# Patient Record
Sex: Female | Born: 1940 | Race: White | Hispanic: No | Marital: Single | Smoking: Never smoker
Health system: Southern US, Community
[De-identification: ages and names within clinical notes are randomized; demographics above are authoritative.]

## PROBLEM LIST (undated history)

## (undated) DIAGNOSIS — J45909 Unspecified asthma, uncomplicated: Secondary | ICD-10-CM

## (undated) DIAGNOSIS — B369 Superficial mycosis, unspecified: Secondary | ICD-10-CM

## (undated) DIAGNOSIS — I10 Essential (primary) hypertension: Secondary | ICD-10-CM

## (undated) DIAGNOSIS — K219 Gastro-esophageal reflux disease without esophagitis: Secondary | ICD-10-CM

## (undated) DIAGNOSIS — L02416 Cutaneous abscess of left lower limb: Secondary | ICD-10-CM

## (undated) DIAGNOSIS — R519 Headache, unspecified: Secondary | ICD-10-CM

## (undated) DIAGNOSIS — M1990 Unspecified osteoarthritis, unspecified site: Secondary | ICD-10-CM

## (undated) DIAGNOSIS — G4730 Sleep apnea, unspecified: Secondary | ICD-10-CM

## (undated) DIAGNOSIS — G500 Trigeminal neuralgia: Secondary | ICD-10-CM

## (undated) DIAGNOSIS — M797 Fibromyalgia: Secondary | ICD-10-CM

## (undated) DIAGNOSIS — R011 Cardiac murmur, unspecified: Secondary | ICD-10-CM

## (undated) DIAGNOSIS — I6521 Occlusion and stenosis of right carotid artery: Secondary | ICD-10-CM

## (undated) DIAGNOSIS — D649 Anemia, unspecified: Secondary | ICD-10-CM

## (undated) DIAGNOSIS — Z8489 Family history of other specified conditions: Secondary | ICD-10-CM

## (undated) DIAGNOSIS — L03116 Cellulitis of left lower limb: Secondary | ICD-10-CM

## (undated) DIAGNOSIS — M3390 Dermatopolymyositis, unspecified, organ involvement unspecified: Secondary | ICD-10-CM

## (undated) DIAGNOSIS — M6281 Muscle weakness (generalized): Secondary | ICD-10-CM

## (undated) DIAGNOSIS — R51 Headache: Secondary | ICD-10-CM

## (undated) DIAGNOSIS — M3313 Other dermatomyositis without myopathy: Secondary | ICD-10-CM

## (undated) DIAGNOSIS — Z9049 Acquired absence of other specified parts of digestive tract: Secondary | ICD-10-CM

## (undated) DIAGNOSIS — E039 Hypothyroidism, unspecified: Secondary | ICD-10-CM

## (undated) DIAGNOSIS — M5430 Sciatica, unspecified side: Secondary | ICD-10-CM

## (undated) DIAGNOSIS — Z973 Presence of spectacles and contact lenses: Secondary | ICD-10-CM

## (undated) DIAGNOSIS — M4800 Spinal stenosis, site unspecified: Secondary | ICD-10-CM

## (undated) HISTORY — PX: APPENDECTOMY: SHX54

## (undated) HISTORY — DX: Muscle weakness (generalized): M62.81

## (undated) HISTORY — DX: Acquired absence of other specified parts of digestive tract: Z90.49

## (undated) HISTORY — PX: TONSILLECTOMY: SUR1361

## (undated) HISTORY — PX: EYE SURGERY: SHX253

## (undated) HISTORY — DX: Spinal stenosis, site unspecified: M48.00

## (undated) HISTORY — DX: Occlusion and stenosis of right carotid artery: I65.21

## (undated) HISTORY — DX: Essential (primary) hypertension: I10

## (undated) HISTORY — DX: Trigeminal neuralgia: G50.0

## (undated) HISTORY — DX: Unspecified asthma, uncomplicated: J45.909

---
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
## 1998-07-29 HISTORY — PX: SHOULDER SURGERY: SHX246

## 1999-07-30 HISTORY — PX: TOTAL KNEE ARTHROPLASTY: SHX125

## 2002-07-29 HISTORY — PX: LUMBAR FUSION: SHX111

## 2003-07-30 HISTORY — PX: TOTAL HIP ARTHROPLASTY: SHX124

## 2003-12-08 ENCOUNTER — Encounter (HOSPITAL_COMMUNITY): Admission: RE | Admit: 2003-12-08 | Discharge: 2003-12-08 | Payer: Self-pay | Admitting: Family Medicine

## 2003-12-29 ENCOUNTER — Ambulatory Visit (HOSPITAL_COMMUNITY): Admission: RE | Admit: 2003-12-29 | Discharge: 2003-12-29 | Payer: Self-pay | Admitting: Family Medicine

## 2004-03-13 ENCOUNTER — Encounter: Admission: RE | Admit: 2004-03-13 | Discharge: 2004-06-11 | Payer: Self-pay | Admitting: Family Medicine

## 2004-04-19 ENCOUNTER — Ambulatory Visit (HOSPITAL_COMMUNITY): Admission: RE | Admit: 2004-04-19 | Discharge: 2004-04-19 | Payer: Self-pay | Admitting: Endocrinology

## 2004-05-08 ENCOUNTER — Encounter (INDEPENDENT_AMBULATORY_CARE_PROVIDER_SITE_OTHER): Payer: Self-pay | Admitting: *Deleted

## 2004-05-08 ENCOUNTER — Ambulatory Visit (HOSPITAL_COMMUNITY): Admission: RE | Admit: 2004-05-08 | Discharge: 2004-05-08 | Payer: Self-pay | Admitting: Endocrinology

## 2004-06-12 ENCOUNTER — Encounter: Admission: RE | Admit: 2004-06-12 | Discharge: 2004-07-28 | Payer: Self-pay | Admitting: Family Medicine

## 2004-07-29 ENCOUNTER — Encounter: Admission: RE | Admit: 2004-07-29 | Discharge: 2004-10-27 | Payer: Self-pay | Admitting: Family Medicine

## 2004-09-22 ENCOUNTER — Ambulatory Visit (HOSPITAL_COMMUNITY): Admission: RE | Admit: 2004-09-22 | Discharge: 2004-09-22 | Payer: Self-pay | Admitting: Family Medicine

## 2004-10-17 ENCOUNTER — Ambulatory Visit (HOSPITAL_COMMUNITY): Admission: RE | Admit: 2004-10-17 | Discharge: 2004-10-17 | Payer: Self-pay | Admitting: Endocrinology

## 2004-10-28 ENCOUNTER — Encounter: Admission: RE | Admit: 2004-10-28 | Discharge: 2004-12-26 | Payer: Self-pay | Admitting: Family Medicine

## 2005-02-04 ENCOUNTER — Ambulatory Visit (HOSPITAL_COMMUNITY): Admission: RE | Admit: 2005-02-04 | Discharge: 2005-02-04 | Payer: Self-pay | Admitting: Endocrinology

## 2005-05-01 ENCOUNTER — Ambulatory Visit (HOSPITAL_COMMUNITY): Admission: RE | Admit: 2005-05-01 | Discharge: 2005-05-01 | Payer: Self-pay | Admitting: Endocrinology

## 2005-06-06 ENCOUNTER — Encounter: Admission: RE | Admit: 2005-06-06 | Discharge: 2005-07-02 | Payer: Self-pay | Admitting: Family Medicine

## 2005-07-03 ENCOUNTER — Encounter: Admission: RE | Admit: 2005-07-03 | Discharge: 2005-08-04 | Payer: Self-pay | Admitting: Family Medicine

## 2005-08-05 ENCOUNTER — Encounter: Admission: RE | Admit: 2005-08-05 | Discharge: 2005-09-03 | Payer: Self-pay | Admitting: Family Medicine

## 2005-09-04 ENCOUNTER — Encounter: Admission: RE | Admit: 2005-09-04 | Discharge: 2005-10-01 | Payer: Self-pay | Admitting: Family Medicine

## 2005-10-02 ENCOUNTER — Encounter: Admission: RE | Admit: 2005-10-02 | Discharge: 2005-12-31 | Payer: Self-pay | Admitting: Family Medicine

## 2005-11-05 ENCOUNTER — Ambulatory Visit (HOSPITAL_COMMUNITY): Admission: RE | Admit: 2005-11-05 | Discharge: 2005-11-05 | Payer: Self-pay | Admitting: Endocrinology

## 2005-11-21 ENCOUNTER — Ambulatory Visit (HOSPITAL_BASED_OUTPATIENT_CLINIC_OR_DEPARTMENT_OTHER): Admission: RE | Admit: 2005-11-21 | Discharge: 2005-11-22 | Payer: Self-pay | Admitting: Orthopedic Surgery

## 2006-10-30 ENCOUNTER — Ambulatory Visit (HOSPITAL_COMMUNITY): Admission: RE | Admit: 2006-10-30 | Discharge: 2006-10-30 | Payer: Self-pay | Admitting: Endocrinology

## 2007-11-12 ENCOUNTER — Encounter: Admission: RE | Admit: 2007-11-12 | Discharge: 2007-11-12 | Payer: Self-pay | Admitting: Endocrinology

## 2008-01-12 ENCOUNTER — Ambulatory Visit (HOSPITAL_COMMUNITY): Admission: RE | Admit: 2008-01-12 | Discharge: 2008-01-12 | Payer: Self-pay | Admitting: Family Medicine

## 2008-01-12 ENCOUNTER — Encounter (INDEPENDENT_AMBULATORY_CARE_PROVIDER_SITE_OTHER): Payer: Self-pay | Admitting: Family Medicine

## 2008-01-12 ENCOUNTER — Ambulatory Visit: Payer: Self-pay | Admitting: Vascular Surgery

## 2008-01-12 ENCOUNTER — Ambulatory Visit: Admission: RE | Admit: 2008-01-12 | Discharge: 2008-01-12 | Payer: Self-pay | Admitting: Family Medicine

## 2009-03-13 ENCOUNTER — Encounter: Admission: RE | Admit: 2009-03-13 | Discharge: 2009-03-13 | Payer: Self-pay | Admitting: Endocrinology

## 2010-01-05 ENCOUNTER — Encounter: Admission: RE | Admit: 2010-01-05 | Discharge: 2010-01-05 | Payer: Self-pay | Admitting: Endocrinology

## 2010-01-24 ENCOUNTER — Other Ambulatory Visit: Admission: RE | Admit: 2010-01-24 | Discharge: 2010-01-24 | Payer: Self-pay | Admitting: Diagnostic Radiology

## 2010-01-24 ENCOUNTER — Encounter: Admission: RE | Admit: 2010-01-24 | Discharge: 2010-01-24 | Payer: Self-pay | Admitting: Endocrinology

## 2010-04-04 ENCOUNTER — Ambulatory Visit (HOSPITAL_COMMUNITY): Admission: RE | Admit: 2010-04-04 | Discharge: 2010-04-04 | Payer: Self-pay | Admitting: Surgery

## 2010-06-14 ENCOUNTER — Encounter: Admission: RE | Admit: 2010-06-14 | Discharge: 2010-06-14 | Payer: Self-pay | Admitting: Gastroenterology

## 2010-08-18 ENCOUNTER — Encounter: Payer: Self-pay | Admitting: Endocrinology

## 2010-08-19 ENCOUNTER — Encounter: Payer: Self-pay | Admitting: Family Medicine

## 2010-08-20 ENCOUNTER — Encounter: Payer: Self-pay | Admitting: Endocrinology

## 2010-11-26 ENCOUNTER — Ambulatory Visit: Payer: Medicare Other | Attending: Orthopedic Surgery | Admitting: Physical Therapy

## 2010-11-26 DIAGNOSIS — IMO0001 Reserved for inherently not codable concepts without codable children: Secondary | ICD-10-CM | POA: Insufficient documentation

## 2010-11-26 DIAGNOSIS — M6281 Muscle weakness (generalized): Secondary | ICD-10-CM | POA: Insufficient documentation

## 2010-11-26 DIAGNOSIS — R262 Difficulty in walking, not elsewhere classified: Secondary | ICD-10-CM | POA: Insufficient documentation

## 2010-12-03 ENCOUNTER — Ambulatory Visit: Payer: Medicare Other | Attending: Orthopedic Surgery | Admitting: *Deleted

## 2010-12-03 ENCOUNTER — Encounter: Payer: Self-pay | Admitting: Speech Pathology

## 2010-12-03 DIAGNOSIS — IMO0001 Reserved for inherently not codable concepts without codable children: Secondary | ICD-10-CM | POA: Insufficient documentation

## 2010-12-03 DIAGNOSIS — M6281 Muscle weakness (generalized): Secondary | ICD-10-CM | POA: Insufficient documentation

## 2010-12-03 DIAGNOSIS — R262 Difficulty in walking, not elsewhere classified: Secondary | ICD-10-CM | POA: Insufficient documentation

## 2010-12-05 ENCOUNTER — Ambulatory Visit: Payer: Medicare Other | Admitting: *Deleted

## 2010-12-05 ENCOUNTER — Encounter: Payer: Self-pay | Admitting: Speech Pathology

## 2010-12-10 ENCOUNTER — Ambulatory Visit: Payer: Medicare Other | Admitting: Physical Therapy

## 2010-12-12 ENCOUNTER — Ambulatory Visit: Payer: Medicare Other | Admitting: *Deleted

## 2010-12-14 NOTE — Op Note (Signed)
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   PROCEDURE:  1.  Manipulation under anesthesia.  2.  Debridement rotator cuff tear and biceps tear.  3.  Subacromial decompression.   SURGEON:  Nadara Mustard, MD   ANESTHESIA:  General.   ESTIMATED BLOOD LOSS:  Minimal.   ANTIBIOTICS:  Clindamycin 600 mg IV.   DRAINS:  None.   COMPLICATIONS:  None.   Injection of 20 mL of half percent Marcaine plain and 4 mg of morphine.   DISPOSITION:  To PACU in stable condition.  Plan for 20 for observation.   INDICATIONS FOR PROCEDURE:  The patient is a 70 year old woman with a  symptomatic right shoulder.  The patient has failed conservative care.  She  has undergone subacromial injections with temporary relief.  The patient  complains of pain with activities of daily living, difficulty sleeping at  night due to pain and presents at this time for arthroscopic intervention.  Risks and benefits were discussed including infection, neurovascular injury,  persistent pain, need for additional surgery.  The patient states she  understands and wished proceed at this time.   DESCRIPTION OF PROCEDURE:  The patient was brought to OR room 5 underwent a  general anesthetic.  After adequate level of anesthesia obtained, the  patient's right upper extremity was prepped using DuraPrep and draped in a  sterile field with the patient in the beach-chair position.  The scope was  inserted into the  posterior portal into the shoulder and the anterior portal  was established from an outside-in technique with a 18 gauge spinal.  Visualization showed almost completely eburnated bone on the glenoid and  humeral head.  There were a few areas of residual cartilage and this was  loose and was debrided.  The patient had a complete rupture of the biceps  and this was debrided.  There was partial tearing of rotator cuff which was  also debrided.  There was also a SLAP lesion which was also debrided.  After  debridement, the vapor Mitek was used for hemostasis and the instruments  removed.  The scope was then inserted from the posterior portal in the  subacromial space and a new lateral portal was established.  The vapor Mitek  wand was inserted.  A bursectomy was performed with the vapor wand.  The  patient had very hooked type 3 acromion and the acromionizer was used for  subacromial decompression.  The vapor Mitek was then used for hemostasis.  The instruments were removed.  The portals  were injected with total 20 mL of  half percent Marcaine plain and 4 mg of morphine.  The wounds were closed  using 4-0 nylon and the wounds were covered with Adaptic orthopedic sponges,  ABD dressing and Hypafix tape.  Patient was extubated, taken to PACU in  stable condition.  Plan for 23 observation.  Plan to follow-up in office in  2 weeks.      Nadara Mustard, MD  Electronically Signed     MVD/MEDQ  D:  11/21/2005  T:  11/22/2005  Job:  [id number]

## 2010-12-17 ENCOUNTER — Ambulatory Visit: Payer: Medicare Other | Admitting: *Deleted

## 2010-12-19 ENCOUNTER — Ambulatory Visit: Payer: Medicare Other | Admitting: *Deleted

## 2010-12-31 ENCOUNTER — Ambulatory Visit: Payer: PRIVATE HEALTH INSURANCE | Admitting: *Deleted

## 2011-01-03 ENCOUNTER — Ambulatory Visit: Payer: Medicare Other | Attending: Orthopedic Surgery | Admitting: *Deleted

## 2011-01-03 DIAGNOSIS — IMO0001 Reserved for inherently not codable concepts without codable children: Secondary | ICD-10-CM | POA: Insufficient documentation

## 2011-01-03 DIAGNOSIS — R262 Difficulty in walking, not elsewhere classified: Secondary | ICD-10-CM | POA: Insufficient documentation

## 2011-01-03 DIAGNOSIS — M6281 Muscle weakness (generalized): Secondary | ICD-10-CM | POA: Insufficient documentation

## 2011-01-28 ENCOUNTER — Ambulatory Visit: Payer: PRIVATE HEALTH INSURANCE | Admitting: *Deleted

## 2011-08-05 DIAGNOSIS — M1710 Unilateral primary osteoarthritis, unspecified knee: Secondary | ICD-10-CM | POA: Diagnosis not present

## 2011-08-05 DIAGNOSIS — M19079 Primary osteoarthritis, unspecified ankle and foot: Secondary | ICD-10-CM | POA: Diagnosis not present

## 2011-08-14 DIAGNOSIS — M1710 Unilateral primary osteoarthritis, unspecified knee: Secondary | ICD-10-CM | POA: Diagnosis not present

## 2011-08-26 DIAGNOSIS — M1710 Unilateral primary osteoarthritis, unspecified knee: Secondary | ICD-10-CM | POA: Diagnosis not present

## 2011-10-16 DIAGNOSIS — S139XXA Sprain of joints and ligaments of unspecified parts of neck, initial encounter: Secondary | ICD-10-CM | POA: Diagnosis not present

## 2011-10-16 DIAGNOSIS — I10 Essential (primary) hypertension: Secondary | ICD-10-CM | POA: Diagnosis not present

## 2011-10-24 ENCOUNTER — Other Ambulatory Visit: Payer: Self-pay | Admitting: Endocrinology

## 2011-10-24 DIAGNOSIS — E041 Nontoxic single thyroid nodule: Secondary | ICD-10-CM

## 2011-10-25 ENCOUNTER — Ambulatory Visit
Admission: RE | Admit: 2011-10-25 | Discharge: 2011-10-25 | Disposition: A | Discharge status: Home / Self Care | Payer: PRIVATE HEALTH INSURANCE | Source: Ambulatory Visit | Attending: Endocrinology | Admitting: Endocrinology

## 2011-10-25 DIAGNOSIS — E041 Nontoxic single thyroid nodule: Secondary | ICD-10-CM

## 2011-10-25 DIAGNOSIS — E042 Nontoxic multinodular goiter: Secondary | ICD-10-CM | POA: Diagnosis not present

## 2011-10-28 DIAGNOSIS — E039 Hypothyroidism, unspecified: Secondary | ICD-10-CM | POA: Diagnosis not present

## 2011-10-28 DIAGNOSIS — I10 Essential (primary) hypertension: Secondary | ICD-10-CM | POA: Diagnosis not present

## 2011-10-28 DIAGNOSIS — E785 Hyperlipidemia, unspecified: Secondary | ICD-10-CM | POA: Diagnosis not present

## 2011-10-28 DIAGNOSIS — E559 Vitamin D deficiency, unspecified: Secondary | ICD-10-CM | POA: Diagnosis not present

## 2011-11-04 DIAGNOSIS — E559 Vitamin D deficiency, unspecified: Secondary | ICD-10-CM | POA: Diagnosis not present

## 2011-11-04 DIAGNOSIS — E049 Nontoxic goiter, unspecified: Secondary | ICD-10-CM | POA: Diagnosis not present

## 2011-11-04 DIAGNOSIS — I10 Essential (primary) hypertension: Secondary | ICD-10-CM | POA: Diagnosis not present

## 2011-11-04 DIAGNOSIS — N318 Other neuromuscular dysfunction of bladder: Secondary | ICD-10-CM | POA: Diagnosis not present

## 2011-11-04 DIAGNOSIS — E039 Hypothyroidism, unspecified: Secondary | ICD-10-CM | POA: Diagnosis not present

## 2011-11-04 DIAGNOSIS — E7800 Pure hypercholesterolemia, unspecified: Secondary | ICD-10-CM | POA: Diagnosis not present

## 2011-11-04 DIAGNOSIS — J45909 Unspecified asthma, uncomplicated: Secondary | ICD-10-CM | POA: Diagnosis not present

## 2011-11-05 ENCOUNTER — Other Ambulatory Visit: Payer: Self-pay | Admitting: Endocrinology

## 2011-11-05 DIAGNOSIS — E049 Nontoxic goiter, unspecified: Secondary | ICD-10-CM

## 2011-12-17 ENCOUNTER — Ambulatory Visit: Payer: Medicare Other | Attending: Family Medicine | Admitting: Rehabilitative and Restorative Service Providers"

## 2011-12-17 DIAGNOSIS — IMO0001 Reserved for inherently not codable concepts without codable children: Secondary | ICD-10-CM | POA: Diagnosis not present

## 2011-12-17 DIAGNOSIS — R293 Abnormal posture: Secondary | ICD-10-CM | POA: Insufficient documentation

## 2011-12-17 DIAGNOSIS — M25519 Pain in unspecified shoulder: Secondary | ICD-10-CM | POA: Insufficient documentation

## 2011-12-25 ENCOUNTER — Ambulatory Visit: Payer: Medicare Other | Admitting: Physical Therapy

## 2011-12-31 ENCOUNTER — Ambulatory Visit: Payer: Medicare Other | Attending: Family Medicine | Admitting: Rehabilitative and Restorative Service Providers"

## 2011-12-31 DIAGNOSIS — M25519 Pain in unspecified shoulder: Secondary | ICD-10-CM | POA: Diagnosis not present

## 2011-12-31 DIAGNOSIS — IMO0001 Reserved for inherently not codable concepts without codable children: Secondary | ICD-10-CM | POA: Diagnosis not present

## 2011-12-31 DIAGNOSIS — R293 Abnormal posture: Secondary | ICD-10-CM | POA: Insufficient documentation

## 2012-01-02 ENCOUNTER — Ambulatory Visit: Payer: Medicare Other | Admitting: Rehabilitative and Restorative Service Providers"

## 2012-01-02 DIAGNOSIS — I10 Essential (primary) hypertension: Secondary | ICD-10-CM | POA: Diagnosis not present

## 2012-01-02 DIAGNOSIS — Z23 Encounter for immunization: Secondary | ICD-10-CM | POA: Diagnosis not present

## 2012-01-05 DIAGNOSIS — Z23 Encounter for immunization: Secondary | ICD-10-CM | POA: Diagnosis not present

## 2012-01-05 DIAGNOSIS — Z203 Contact with and (suspected) exposure to rabies: Secondary | ICD-10-CM | POA: Diagnosis not present

## 2012-01-07 ENCOUNTER — Ambulatory Visit: Payer: Medicare Other | Admitting: Rehabilitative and Restorative Service Providers"

## 2012-01-09 ENCOUNTER — Ambulatory Visit: Payer: Medicare Other | Admitting: Rehabilitative and Restorative Service Providers"

## 2012-01-09 DIAGNOSIS — Z23 Encounter for immunization: Secondary | ICD-10-CM | POA: Diagnosis not present

## 2012-01-09 DIAGNOSIS — Z203 Contact with and (suspected) exposure to rabies: Secondary | ICD-10-CM | POA: Diagnosis not present

## 2012-01-16 DIAGNOSIS — Z23 Encounter for immunization: Secondary | ICD-10-CM | POA: Diagnosis not present

## 2012-01-16 DIAGNOSIS — Z203 Contact with and (suspected) exposure to rabies: Secondary | ICD-10-CM | POA: Diagnosis not present

## 2012-01-21 ENCOUNTER — Ambulatory Visit: Payer: Medicare Other | Admitting: Physical Therapy

## 2012-01-23 ENCOUNTER — Ambulatory Visit: Payer: Medicare Other | Admitting: Physical Therapy

## 2012-01-23 DIAGNOSIS — E7800 Pure hypercholesterolemia, unspecified: Secondary | ICD-10-CM | POA: Diagnosis not present

## 2012-01-23 DIAGNOSIS — L0291 Cutaneous abscess, unspecified: Secondary | ICD-10-CM | POA: Diagnosis not present

## 2012-01-23 DIAGNOSIS — R609 Edema, unspecified: Secondary | ICD-10-CM | POA: Diagnosis not present

## 2012-01-23 DIAGNOSIS — Z23 Encounter for immunization: Secondary | ICD-10-CM | POA: Diagnosis not present

## 2012-01-23 DIAGNOSIS — M7989 Other specified soft tissue disorders: Secondary | ICD-10-CM | POA: Diagnosis not present

## 2012-01-27 ENCOUNTER — Encounter: Payer: Medicare Other | Admitting: Physical Therapy

## 2012-01-28 ENCOUNTER — Encounter: Payer: Medicare Other | Admitting: Physical Therapy

## 2012-02-03 ENCOUNTER — Ambulatory Visit: Payer: Medicare Other | Attending: Family Medicine | Admitting: Rehabilitative and Restorative Service Providers"

## 2012-02-03 DIAGNOSIS — R293 Abnormal posture: Secondary | ICD-10-CM | POA: Insufficient documentation

## 2012-02-03 DIAGNOSIS — IMO0001 Reserved for inherently not codable concepts without codable children: Secondary | ICD-10-CM | POA: Diagnosis not present

## 2012-02-03 DIAGNOSIS — M25519 Pain in unspecified shoulder: Secondary | ICD-10-CM | POA: Diagnosis not present

## 2012-02-04 DIAGNOSIS — S92309A Fracture of unspecified metatarsal bone(s), unspecified foot, initial encounter for closed fracture: Secondary | ICD-10-CM | POA: Diagnosis not present

## 2012-02-04 DIAGNOSIS — M25579 Pain in unspecified ankle and joints of unspecified foot: Secondary | ICD-10-CM | POA: Diagnosis not present

## 2012-02-06 ENCOUNTER — Encounter: Payer: Medicare Other | Admitting: Rehabilitative and Restorative Service Providers"

## 2012-02-27 DIAGNOSIS — E8941 Symptomatic postprocedural ovarian failure: Secondary | ICD-10-CM | POA: Diagnosis not present

## 2012-03-09 DIAGNOSIS — L03119 Cellulitis of unspecified part of limb: Secondary | ICD-10-CM | POA: Diagnosis not present

## 2012-03-09 DIAGNOSIS — L02419 Cutaneous abscess of limb, unspecified: Secondary | ICD-10-CM | POA: Diagnosis not present

## 2012-06-19 DIAGNOSIS — B029 Zoster without complications: Secondary | ICD-10-CM | POA: Diagnosis not present

## 2012-06-19 DIAGNOSIS — L259 Unspecified contact dermatitis, unspecified cause: Secondary | ICD-10-CM | POA: Diagnosis not present

## 2012-06-19 DIAGNOSIS — R609 Edema, unspecified: Secondary | ICD-10-CM | POA: Diagnosis not present

## 2012-06-19 DIAGNOSIS — Z23 Encounter for immunization: Secondary | ICD-10-CM | POA: Diagnosis not present

## 2012-07-23 DIAGNOSIS — M1710 Unilateral primary osteoarthritis, unspecified knee: Secondary | ICD-10-CM | POA: Diagnosis not present

## 2012-08-07 DIAGNOSIS — M1710 Unilateral primary osteoarthritis, unspecified knee: Secondary | ICD-10-CM | POA: Diagnosis not present

## 2012-08-13 DIAGNOSIS — M1710 Unilateral primary osteoarthritis, unspecified knee: Secondary | ICD-10-CM | POA: Diagnosis not present

## 2012-08-21 DIAGNOSIS — E876 Hypokalemia: Secondary | ICD-10-CM | POA: Diagnosis not present

## 2012-08-25 ENCOUNTER — Other Ambulatory Visit: Payer: Self-pay | Admitting: Endocrinology

## 2012-08-25 DIAGNOSIS — E049 Nontoxic goiter, unspecified: Secondary | ICD-10-CM

## 2012-08-25 DIAGNOSIS — E559 Vitamin D deficiency, unspecified: Secondary | ICD-10-CM | POA: Diagnosis not present

## 2012-08-25 DIAGNOSIS — I10 Essential (primary) hypertension: Secondary | ICD-10-CM | POA: Diagnosis not present

## 2012-08-25 DIAGNOSIS — E039 Hypothyroidism, unspecified: Secondary | ICD-10-CM | POA: Diagnosis not present

## 2012-08-31 ENCOUNTER — Ambulatory Visit
Admission: RE | Admit: 2012-08-31 | Discharge: 2012-08-31 | Disposition: A | Discharge status: Home / Self Care | Payer: Medicare Other | Source: Ambulatory Visit | Attending: Endocrinology | Admitting: Endocrinology

## 2012-08-31 DIAGNOSIS — E042 Nontoxic multinodular goiter: Secondary | ICD-10-CM | POA: Diagnosis not present

## 2012-08-31 DIAGNOSIS — E049 Nontoxic goiter, unspecified: Secondary | ICD-10-CM

## 2012-10-21 DIAGNOSIS — R5381 Other malaise: Secondary | ICD-10-CM | POA: Diagnosis not present

## 2012-10-21 DIAGNOSIS — R5383 Other fatigue: Secondary | ICD-10-CM | POA: Diagnosis not present

## 2012-10-21 DIAGNOSIS — M6281 Muscle weakness (generalized): Secondary | ICD-10-CM | POA: Diagnosis not present

## 2012-10-21 DIAGNOSIS — R609 Edema, unspecified: Secondary | ICD-10-CM | POA: Diagnosis not present

## 2012-10-21 DIAGNOSIS — IMO0001 Reserved for inherently not codable concepts without codable children: Secondary | ICD-10-CM | POA: Diagnosis not present

## 2012-10-21 DIAGNOSIS — M62838 Other muscle spasm: Secondary | ICD-10-CM | POA: Diagnosis not present

## 2012-11-26 ENCOUNTER — Ambulatory Visit (INDEPENDENT_AMBULATORY_CARE_PROVIDER_SITE_OTHER): Payer: Medicare Other | Admitting: Diagnostic Neuroimaging

## 2012-11-26 ENCOUNTER — Encounter: Payer: Self-pay | Admitting: Diagnostic Neuroimaging

## 2012-11-26 ENCOUNTER — Other Ambulatory Visit: Payer: Self-pay | Admitting: *Deleted

## 2012-11-26 VITALS — BP 150/85 | HR 81 | Temp 98.9°F | Ht 61.0 in

## 2012-11-26 DIAGNOSIS — M62838 Other muscle spasm: Secondary | ICD-10-CM | POA: Diagnosis not present

## 2012-11-26 DIAGNOSIS — R5381 Other malaise: Secondary | ICD-10-CM

## 2012-11-26 DIAGNOSIS — R531 Weakness: Secondary | ICD-10-CM

## 2012-11-26 DIAGNOSIS — R269 Unspecified abnormalities of gait and mobility: Secondary | ICD-10-CM | POA: Diagnosis not present

## 2012-11-26 DIAGNOSIS — R5383 Other fatigue: Secondary | ICD-10-CM | POA: Diagnosis not present

## 2012-11-26 NOTE — Patient Instructions (Signed)
I will check MRI cervical and lumbar spine. I will check blood work. If this is unremarkable we may consider electrical nerve test (EMG) next.
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
## 2012-12-08 ENCOUNTER — Ambulatory Visit
Admission: RE | Admit: 2012-12-08 | Discharge: 2012-12-08 | Disposition: A | Discharge status: Home / Self Care | Payer: Medicare Other | Source: Ambulatory Visit | Attending: Diagnostic Neuroimaging | Admitting: Diagnostic Neuroimaging

## 2012-12-08 DIAGNOSIS — R5381 Other malaise: Secondary | ICD-10-CM | POA: Diagnosis not present

## 2012-12-08 DIAGNOSIS — M62838 Other muscle spasm: Secondary | ICD-10-CM

## 2012-12-08 DIAGNOSIS — R269 Unspecified abnormalities of gait and mobility: Secondary | ICD-10-CM

## 2012-12-08 DIAGNOSIS — R531 Weakness: Secondary | ICD-10-CM

## 2012-12-16 ENCOUNTER — Telehealth: Payer: Self-pay | Admitting: Diagnostic Neuroimaging

## 2012-12-17 ENCOUNTER — Telehealth: Payer: Self-pay | Admitting: Diagnostic Neuroimaging

## 2012-12-17 DIAGNOSIS — R531 Weakness: Secondary | ICD-10-CM

## 2012-12-17 NOTE — Telephone Encounter (Signed)
I called and spoke to pt and and then deferred to Dr. Marjory Lies.

## 2012-12-17 NOTE — Telephone Encounter (Signed)
Spoke with patient about MRI cervical lumbar spine results. She has multiple levels of mild spinal stenosis at multilevel foraminal stenosis. May contribute to weakness and gait, but not definite. She also has other factors to contribute to her gait (hip replacement, knee replacement, obesity).  Discussed option of surgical evaluation, and patient wants me to send the results to her previous neurosurgeon Dr. Stasia Cavalier.   Also reports that her well water was tested for arsenic and was within normal limits, but low levels were detected.  Also I will check labs and EMG.   Suanne Marker, MD 12/17/2012, 10:11 AM Certified in Neurology, Neurophysiology and Neuroimaging  [facility] [address], [address] [place], [place] [address] [phone]

## 2012-12-22 ENCOUNTER — Other Ambulatory Visit: Payer: Self-pay | Admitting: Diagnostic Neuroimaging

## 2012-12-22 DIAGNOSIS — R5383 Other fatigue: Secondary | ICD-10-CM | POA: Diagnosis not present

## 2012-12-22 DIAGNOSIS — R5381 Other malaise: Secondary | ICD-10-CM | POA: Diagnosis not present

## 2012-12-22 DIAGNOSIS — M62838 Other muscle spasm: Secondary | ICD-10-CM | POA: Diagnosis not present

## 2012-12-22 DIAGNOSIS — R269 Unspecified abnormalities of gait and mobility: Secondary | ICD-10-CM | POA: Diagnosis not present

## 2012-12-23 ENCOUNTER — Encounter: Payer: Self-pay | Admitting: Diagnostic Neuroimaging

## 2012-12-24 LAB — CK: Total CK: 18 U/L — ABNORMAL LOW (ref 24–173)

## 2012-12-24 LAB — HEAVY METALS SCREEN, URINE
Arsenic Ur: NOT DETECTED ug/L (ref 0–50)
Arsenic(Inorganic),U: NOT DETECTED ug/L (ref 0–19)
Creatinine(Crt),U: 0.49 g/L (ref 0.30–3.00)

## 2012-12-24 LAB — ARSENIC, BLOOD: Arsenic: 6 ug/L (ref 2–23)

## 2012-12-24 LAB — TSH: TSH: 1.18 u[IU]/mL (ref 0.450–4.500)

## 2012-12-24 LAB — ACETYLCHOLINE RECEPTOR, BINDING: AChR Binding Ab, Serum: 0.05 nmol/L (ref 0.00–0.24)

## 2012-12-31 ENCOUNTER — Telehealth: Payer: Self-pay | Admitting: Diagnostic Neuroimaging

## 2012-12-31 NOTE — Telephone Encounter (Signed)
I called and spoke with the patient to inform her that her lab are in but Dr. Marjory Lies need to review them and once reviewed someone will call her with results.

## 2013-01-05 ENCOUNTER — Telehealth: Payer: Self-pay | Admitting: Diagnostic Neuroimaging

## 2013-01-05 NOTE — Telephone Encounter (Signed)
Patient is calling back to tell us she hasn't heard back from anyone regarding her most recent lab tests.  She can be reached at [phone]

## 2013-01-06 DIAGNOSIS — G4733 Obstructive sleep apnea (adult) (pediatric): Secondary | ICD-10-CM | POA: Diagnosis not present

## 2013-01-06 NOTE — Telephone Encounter (Signed)
Spoke to patient. She was at a doctor's office appt. Requesting lab results. Will call back after appointment.

## 2013-01-15 ENCOUNTER — Encounter: Payer: Self-pay | Admitting: *Deleted

## 2013-01-15 DIAGNOSIS — M47812 Spondylosis without myelopathy or radiculopathy, cervical region: Secondary | ICD-10-CM | POA: Diagnosis not present

## 2013-01-15 DIAGNOSIS — M4802 Spinal stenosis, cervical region: Secondary | ICD-10-CM | POA: Diagnosis not present

## 2013-01-15 DIAGNOSIS — M47817 Spondylosis without myelopathy or radiculopathy, lumbosacral region: Secondary | ICD-10-CM | POA: Diagnosis not present

## 2013-01-15 DIAGNOSIS — M6281 Muscle weakness (generalized): Secondary | ICD-10-CM | POA: Diagnosis not present

## 2013-01-15 NOTE — Telephone Encounter (Signed)
Message copied by Monico Blitz on Fri Jan 15, 2013  4:26 PM ------      Message from: [facility], [place]      Created: Fri Jan 15, 2013  2:23 PM      Contact: pt       Pt calling and wants results of lab work that she had several weeks ago.  She needs results of an anti body test that was done.  She can be reached @ [phone].  Please call ASAP ------

## 2013-01-15 NOTE — Telephone Encounter (Signed)
This encounter was created in error - please disregard.

## 2013-02-17 DIAGNOSIS — M48061 Spinal stenosis, lumbar region without neurogenic claudication: Secondary | ICD-10-CM | POA: Diagnosis not present

## 2013-03-09 DIAGNOSIS — M545 Low back pain, unspecified: Secondary | ICD-10-CM | POA: Diagnosis not present

## 2013-03-09 DIAGNOSIS — M6281 Muscle weakness (generalized): Secondary | ICD-10-CM | POA: Diagnosis not present

## 2013-03-09 DIAGNOSIS — M48061 Spinal stenosis, lumbar region without neurogenic claudication: Secondary | ICD-10-CM | POA: Diagnosis not present

## 2013-03-09 DIAGNOSIS — R252 Cramp and spasm: Secondary | ICD-10-CM | POA: Diagnosis not present

## 2013-03-11 DIAGNOSIS — G4733 Obstructive sleep apnea (adult) (pediatric): Secondary | ICD-10-CM | POA: Diagnosis not present

## 2013-03-24 DIAGNOSIS — M48061 Spinal stenosis, lumbar region without neurogenic claudication: Secondary | ICD-10-CM | POA: Diagnosis not present

## 2013-03-24 DIAGNOSIS — R252 Cramp and spasm: Secondary | ICD-10-CM | POA: Diagnosis not present

## 2013-03-24 DIAGNOSIS — M6281 Muscle weakness (generalized): Secondary | ICD-10-CM | POA: Diagnosis not present

## 2013-03-24 DIAGNOSIS — M216X9 Other acquired deformities of unspecified foot: Secondary | ICD-10-CM | POA: Diagnosis not present

## 2013-04-14 DIAGNOSIS — M6281 Muscle weakness (generalized): Secondary | ICD-10-CM | POA: Diagnosis not present

## 2013-04-14 DIAGNOSIS — E669 Obesity, unspecified: Secondary | ICD-10-CM | POA: Diagnosis not present

## 2013-04-20 DIAGNOSIS — M6281 Muscle weakness (generalized): Secondary | ICD-10-CM | POA: Diagnosis not present

## 2013-04-22 DIAGNOSIS — J45909 Unspecified asthma, uncomplicated: Secondary | ICD-10-CM | POA: Diagnosis not present

## 2013-04-22 DIAGNOSIS — I10 Essential (primary) hypertension: Secondary | ICD-10-CM | POA: Diagnosis not present

## 2013-04-22 DIAGNOSIS — K219 Gastro-esophageal reflux disease without esophagitis: Secondary | ICD-10-CM | POA: Diagnosis not present

## 2013-04-22 DIAGNOSIS — M6281 Muscle weakness (generalized): Secondary | ICD-10-CM | POA: Diagnosis not present

## 2013-04-22 DIAGNOSIS — Z79899 Other long term (current) drug therapy: Secondary | ICD-10-CM | POA: Diagnosis not present

## 2013-04-22 DIAGNOSIS — G4733 Obstructive sleep apnea (adult) (pediatric): Secondary | ICD-10-CM | POA: Diagnosis not present

## 2013-04-22 DIAGNOSIS — R0602 Shortness of breath: Secondary | ICD-10-CM | POA: Diagnosis not present

## 2013-04-23 DIAGNOSIS — M6281 Muscle weakness (generalized): Secondary | ICD-10-CM | POA: Diagnosis not present

## 2013-05-03 DIAGNOSIS — Z79899 Other long term (current) drug therapy: Secondary | ICD-10-CM | POA: Diagnosis not present

## 2013-05-03 DIAGNOSIS — J45909 Unspecified asthma, uncomplicated: Secondary | ICD-10-CM | POA: Diagnosis not present

## 2013-05-03 DIAGNOSIS — R0602 Shortness of breath: Secondary | ICD-10-CM | POA: Diagnosis not present

## 2013-05-03 DIAGNOSIS — K219 Gastro-esophageal reflux disease without esophagitis: Secondary | ICD-10-CM | POA: Diagnosis not present

## 2013-05-03 DIAGNOSIS — G4733 Obstructive sleep apnea (adult) (pediatric): Secondary | ICD-10-CM | POA: Diagnosis not present

## 2013-05-03 DIAGNOSIS — M6281 Muscle weakness (generalized): Secondary | ICD-10-CM | POA: Diagnosis not present

## 2013-05-03 DIAGNOSIS — I10 Essential (primary) hypertension: Secondary | ICD-10-CM | POA: Diagnosis not present

## 2013-05-14 DIAGNOSIS — M6281 Muscle weakness (generalized): Secondary | ICD-10-CM | POA: Diagnosis not present

## 2013-05-19 DIAGNOSIS — M6281 Muscle weakness (generalized): Secondary | ICD-10-CM | POA: Diagnosis not present

## 2013-05-24 DIAGNOSIS — M6281 Muscle weakness (generalized): Secondary | ICD-10-CM | POA: Diagnosis not present

## 2013-05-24 DIAGNOSIS — R252 Cramp and spasm: Secondary | ICD-10-CM | POA: Diagnosis not present

## 2013-05-24 DIAGNOSIS — IMO0001 Reserved for inherently not codable concepts without codable children: Secondary | ICD-10-CM | POA: Diagnosis not present

## 2013-05-26 DIAGNOSIS — M6281 Muscle weakness (generalized): Secondary | ICD-10-CM | POA: Diagnosis not present

## 2013-05-27 DIAGNOSIS — M6281 Muscle weakness (generalized): Secondary | ICD-10-CM | POA: Diagnosis not present

## 2013-05-28 DIAGNOSIS — Z1331 Encounter for screening for depression: Secondary | ICD-10-CM | POA: Diagnosis not present

## 2013-05-28 DIAGNOSIS — E785 Hyperlipidemia, unspecified: Secondary | ICD-10-CM | POA: Diagnosis not present

## 2013-05-28 DIAGNOSIS — N318 Other neuromuscular dysfunction of bladder: Secondary | ICD-10-CM | POA: Diagnosis not present

## 2013-05-28 DIAGNOSIS — I10 Essential (primary) hypertension: Secondary | ICD-10-CM | POA: Diagnosis not present

## 2013-05-28 DIAGNOSIS — M6281 Muscle weakness (generalized): Secondary | ICD-10-CM | POA: Diagnosis not present

## 2013-05-28 DIAGNOSIS — J45909 Unspecified asthma, uncomplicated: Secondary | ICD-10-CM | POA: Diagnosis not present

## 2013-05-28 DIAGNOSIS — M5430 Sciatica, unspecified side: Secondary | ICD-10-CM | POA: Diagnosis not present

## 2013-05-28 DIAGNOSIS — Z23 Encounter for immunization: Secondary | ICD-10-CM | POA: Diagnosis not present

## 2013-06-01 DIAGNOSIS — M6281 Muscle weakness (generalized): Secondary | ICD-10-CM | POA: Diagnosis not present

## 2013-06-04 DIAGNOSIS — R531 Weakness: Secondary | ICD-10-CM | POA: Insufficient documentation

## 2013-06-04 DIAGNOSIS — M6281 Muscle weakness (generalized): Secondary | ICD-10-CM | POA: Diagnosis not present

## 2013-06-07 DIAGNOSIS — M6281 Muscle weakness (generalized): Secondary | ICD-10-CM | POA: Diagnosis not present

## 2013-06-15 DIAGNOSIS — L02619 Cutaneous abscess of unspecified foot: Secondary | ICD-10-CM | POA: Diagnosis not present

## 2013-06-22 ENCOUNTER — Ambulatory Visit (INDEPENDENT_AMBULATORY_CARE_PROVIDER_SITE_OTHER): Payer: Medicare Other | Admitting: Podiatrist

## 2013-06-22 ENCOUNTER — Encounter: Payer: Self-pay | Admitting: Podiatrist

## 2013-06-22 DIAGNOSIS — M79609 Pain in unspecified limb: Secondary | ICD-10-CM | POA: Diagnosis not present

## 2013-06-22 DIAGNOSIS — B351 Tinea unguium: Secondary | ICD-10-CM | POA: Diagnosis not present

## 2013-06-22 DIAGNOSIS — M898X9 Other specified disorders of bone, unspecified site: Secondary | ICD-10-CM | POA: Diagnosis not present

## 2013-06-22 NOTE — Progress Notes (Signed)
  Subjective:    [name], female    DOB: 1941-05-16, 72 y.o.   MRN: [id number]  HPI" I need my toenails cut and went to Dr. Wynelle Link last week about my 4th toe on my right foot and it was red and swollen last Tuesday and that is when i went to doctor and was sore"  Subjective: Patient presents today for bilateral foot and nail care. She states that the right fourth toenail was long and she asked a friend to try and help her clip it. Her friend got a little too close and nicked the end of the toe which caused it to be red and swollen. She's been on antibiotics for the last week and states that she's noticed significant improvement in the appearance of the toe. The remainder of the patient's toenails are long and thick and she's unable to trim them on her own. She also has had a broken bone in her right foot which causes a protrusion and an overlying callus. He also states that she's sensitive on her feet.   Review of Systems  Constitutional: Positive for fatigue.  HENT: Negative.   Eyes: Negative.   Respiratory: Negative.   Cardiovascular: Negative.   Gastrointestinal: Negative.   Endocrine: Negative.   Genitourinary: Negative.   Musculoskeletal: Negative.   Skin: Negative.   Neurological: Negative.   Hematological: Bruises/bleeds easily.   Patient also states that she has a idiopathic degenerating muscle weakness of which she is being seen at [facility] and [facility] to try and figure out because of her weakness and treatment.    Objective:   Physical Exam GENERAL APPEARANCE: Alert, conversant. Appropriately groomed. No acute distress.  VASCULAR: Pedal pulses palpable and strong bilateral.  Capillary refill time is immediate to all digits,  Proximal to distal cooling it warm to warm.  Lower extremity swelling is noted bilateral. Multiple varicosities are present. NEUROLOGIC: sensation is intact epicritically and protectively to 5.07 monofilament at 5/5 sites bilateral.  Light  touch is intact bilateral, vibratory sensation intact bilateral,  MUSCULOSKELETAL: acceptable muscle strength, tone and stability bilateral.  Intrinsic muscluature intact bilateral.  Large exostosis present fifth metatarsal base right foot. Contracture deformity of the lesser digits left greater than right seen. DERMATOLOGIC: Skin color, texture, turgor are decreased bilateral. Hyperkeratotic lesion is present medial aspect of the right hallux and plantar lateral aspect of the right foot. All toenails are elongated, thickened, dystrophic, mycotic. Patient is sensitive to touch and debridement. Right fourth toenail appears slightly pink but improved from the patient's subjective findings.        Assessment & Plan:  Assessment: Painful mycotic nails, exostosis, corn and callus Plan: Debrided the lesions and debrided the nails without complication. She will be seen back in 3 months or as needed for followup. Instructed her to finish up the antibiotics for the fourth toe, if she notices any redness or swelling arise again she is instructed to call me for refill.

## 2013-06-22 NOTE — Patient Instructions (Signed)
I realize you are not diabetic, however, your feet are similar to that of a Diabetic patient and will need special care to keep them nice and healthy.  Here are some pointers to help with Healthy Feet.    Diabetes and Foot Care Diabetes may cause you to have problems because of poor blood supply (circulation) to your feet and legs. This may cause the skin on your feet to become thinner, break easier, and heal more slowly. Your skin may become dry, and the skin may peel and crack. You may also have nerve damage in your legs and feet causing decreased feeling in them. You may not notice minor injuries to your feet that could lead to infections or more serious problems. Taking care of your feet is one of the most important things you can do for yourself.  HOME CARE INSTRUCTIONS  Wear shoes at all times, even in the house. Do not go barefoot. Bare feet are easily injured.  Check your feet daily for blisters, cuts, and redness. If you cannot see the bottom of your feet, use a mirror or ask someone for help.  Wash your feet with warm water (do not use hot water) and mild soap. Then pat your feet and the areas between your toes until they are completely dry. Do not soak your feet as this can dry your skin.  Apply a moisturizing lotion or petroleum jelly (that does not contain alcohol and is unscented) to the skin on your feet and to dry, brittle toenails. Do not apply lotion between your toes.  Trim your toenails straight across. Do not dig under them or around the cuticle. File the edges of your nails with an emery board or nail file.  Do not cut corns or calluses or try to remove them with medicine.  Wear clean socks or stockings every day. Make sure they are not too tight. Do not wear knee-high stockings since they may decrease blood flow to your legs.  Wear shoes that fit properly and have enough cushioning. To break in new shoes, wear them for just a few hours a day. This prevents you from injuring  your feet. Always look in your shoes before you put them on to be sure there are no objects inside.  Do not cross your legs. This may decrease the blood flow to your feet.  If you find a minor scrape, cut, or break in the skin on your feet, keep it and the skin around it clean and dry. These areas may be cleansed with mild soap and water. Do not cleanse the area with peroxide, alcohol, or iodine.  When you remove an adhesive bandage, be sure not to damage the skin around it.  If you have a wound, look at it several times a day to make sure it is healing.  Do not use heating pads or hot water bottles. They may burn your skin. If you have lost feeling in your feet or legs, you may not know it is happening until it is too late.  Make sure your health care provider performs a complete foot exam at least annually or more often if you have foot problems. Report any cuts, sores, or bruises to your health care provider immediately. SEEK MEDICAL CARE IF:   You have an injury that is not healing.  You have cuts or breaks in the skin.  You have an ingrown nail.  You notice redness on your legs or feet.  You feel burning or  tingling in your legs or feet.  You have pain or cramps in your legs and feet.  Your legs or feet are numb.  Your feet always feel cold. SEEK IMMEDIATE MEDICAL CARE IF:   There is increasing redness, swelling, or pain in or around a wound.  There is a red line that goes up your leg.  Pus is coming from a wound.  You develop a fever or as directed by your health care provider.  You notice a bad smell coming from an ulcer or wound. Document Released: 07/12/2000 Document Revised: 03/17/2013 Document Reviewed: 12/22/2012 [facility] Patient Information 2014 [place], [place].

## 2013-06-23 DIAGNOSIS — M6281 Muscle weakness (generalized): Secondary | ICD-10-CM | POA: Diagnosis not present

## 2013-06-25 DIAGNOSIS — M6281 Muscle weakness (generalized): Secondary | ICD-10-CM | POA: Diagnosis not present

## 2013-06-30 DIAGNOSIS — M6281 Muscle weakness (generalized): Secondary | ICD-10-CM | POA: Diagnosis not present

## 2013-06-30 DIAGNOSIS — G729 Myopathy, unspecified: Secondary | ICD-10-CM | POA: Diagnosis not present

## 2013-07-02 DIAGNOSIS — G729 Myopathy, unspecified: Secondary | ICD-10-CM | POA: Diagnosis not present

## 2013-07-07 DIAGNOSIS — M6281 Muscle weakness (generalized): Secondary | ICD-10-CM | POA: Diagnosis not present

## 2013-07-09 DIAGNOSIS — M6281 Muscle weakness (generalized): Secondary | ICD-10-CM | POA: Diagnosis not present

## 2013-07-13 DIAGNOSIS — M6281 Muscle weakness (generalized): Secondary | ICD-10-CM | POA: Diagnosis not present

## 2013-07-15 DIAGNOSIS — M6281 Muscle weakness (generalized): Secondary | ICD-10-CM | POA: Diagnosis not present

## 2013-08-25 DIAGNOSIS — M7989 Other specified soft tissue disorders: Secondary | ICD-10-CM | POA: Diagnosis not present

## 2013-08-25 DIAGNOSIS — L259 Unspecified contact dermatitis, unspecified cause: Secondary | ICD-10-CM | POA: Diagnosis not present

## 2013-08-26 ENCOUNTER — Other Ambulatory Visit: Payer: Self-pay | Admitting: Endocrinology

## 2013-08-26 DIAGNOSIS — E049 Nontoxic goiter, unspecified: Secondary | ICD-10-CM

## 2013-08-26 DIAGNOSIS — E041 Nontoxic single thyroid nodule: Secondary | ICD-10-CM

## 2013-08-30 DIAGNOSIS — G729 Myopathy, unspecified: Secondary | ICD-10-CM | POA: Diagnosis not present

## 2013-09-07 ENCOUNTER — Ambulatory Visit
Admission: RE | Admit: 2013-09-07 | Discharge: 2013-09-07 | Disposition: A | Discharge status: Home / Self Care | Payer: Medicare Other | Source: Ambulatory Visit | Attending: Endocrinology | Admitting: Endocrinology

## 2013-09-07 DIAGNOSIS — E049 Nontoxic goiter, unspecified: Secondary | ICD-10-CM

## 2013-09-07 DIAGNOSIS — E042 Nontoxic multinodular goiter: Secondary | ICD-10-CM | POA: Diagnosis not present

## 2013-09-21 ENCOUNTER — Encounter: Payer: Self-pay | Admitting: Podiatrist

## 2013-09-21 ENCOUNTER — Ambulatory Visit (INDEPENDENT_AMBULATORY_CARE_PROVIDER_SITE_OTHER): Payer: Medicare Other | Admitting: Podiatrist

## 2013-09-21 VITALS — BP 143/77 | HR 90 | Resp 18

## 2013-09-21 DIAGNOSIS — M79609 Pain in unspecified limb: Secondary | ICD-10-CM | POA: Diagnosis not present

## 2013-09-21 DIAGNOSIS — M898X9 Other specified disorders of bone, unspecified site: Secondary | ICD-10-CM

## 2013-09-21 DIAGNOSIS — B351 Tinea unguium: Secondary | ICD-10-CM

## 2013-09-21 NOTE — Progress Notes (Deleted)
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
## 2013-09-21 NOTE — Patient Instructions (Signed)
GENERAL FOOT HEALTH INFORMATION:  Watch your toenails for any signs of infection including drainage, pus redness or swelling along the sides of the toenails.  Soak in epsom salt water and use antibiotic ointment (OTC) if you notice this start to occur.  If the redness does not resolve within 2-3 days, call for an appointment to be seen.  

## 2013-10-12 DIAGNOSIS — I10 Essential (primary) hypertension: Secondary | ICD-10-CM | POA: Diagnosis not present

## 2013-10-12 DIAGNOSIS — M6281 Muscle weakness (generalized): Secondary | ICD-10-CM | POA: Diagnosis not present

## 2013-10-12 DIAGNOSIS — M3390 Dermatopolymyositis, unspecified, organ involvement unspecified: Secondary | ICD-10-CM | POA: Diagnosis not present

## 2013-10-12 DIAGNOSIS — N318 Other neuromuscular dysfunction of bladder: Secondary | ICD-10-CM | POA: Diagnosis not present

## 2013-10-12 DIAGNOSIS — E559 Vitamin D deficiency, unspecified: Secondary | ICD-10-CM | POA: Diagnosis not present

## 2013-11-23 DIAGNOSIS — R319 Hematuria, unspecified: Secondary | ICD-10-CM | POA: Diagnosis not present

## 2013-11-23 DIAGNOSIS — N390 Urinary tract infection, site not specified: Secondary | ICD-10-CM | POA: Diagnosis not present

## 2013-11-23 DIAGNOSIS — I10 Essential (primary) hypertension: Secondary | ICD-10-CM | POA: Diagnosis not present

## 2013-11-23 DIAGNOSIS — R7309 Other abnormal glucose: Secondary | ICD-10-CM | POA: Diagnosis not present

## 2013-12-01 DIAGNOSIS — L02419 Cutaneous abscess of limb, unspecified: Secondary | ICD-10-CM | POA: Diagnosis not present

## 2013-12-01 DIAGNOSIS — R55 Syncope and collapse: Secondary | ICD-10-CM | POA: Diagnosis not present

## 2013-12-01 DIAGNOSIS — R4182 Altered mental status, unspecified: Secondary | ICD-10-CM | POA: Diagnosis not present

## 2013-12-01 DIAGNOSIS — L03119 Cellulitis of unspecified part of limb: Secondary | ICD-10-CM | POA: Diagnosis not present

## 2013-12-02 DIAGNOSIS — G9340 Encephalopathy, unspecified: Secondary | ICD-10-CM | POA: Diagnosis not present

## 2013-12-02 DIAGNOSIS — E039 Hypothyroidism, unspecified: Secondary | ICD-10-CM | POA: Diagnosis not present

## 2013-12-02 DIAGNOSIS — K219 Gastro-esophageal reflux disease without esophagitis: Secondary | ICD-10-CM | POA: Diagnosis not present

## 2013-12-02 DIAGNOSIS — R609 Edema, unspecified: Secondary | ICD-10-CM | POA: Diagnosis not present

## 2013-12-02 DIAGNOSIS — M5430 Sciatica, unspecified side: Secondary | ICD-10-CM | POA: Diagnosis not present

## 2013-12-02 DIAGNOSIS — G319 Degenerative disease of nervous system, unspecified: Secondary | ICD-10-CM | POA: Diagnosis not present

## 2013-12-02 DIAGNOSIS — F99 Mental disorder, not otherwise specified: Secondary | ICD-10-CM | POA: Diagnosis not present

## 2013-12-03 DIAGNOSIS — F29 Unspecified psychosis not due to a substance or known physiological condition: Secondary | ICD-10-CM | POA: Diagnosis not present

## 2013-12-03 DIAGNOSIS — J449 Chronic obstructive pulmonary disease, unspecified: Secondary | ICD-10-CM | POA: Diagnosis not present

## 2013-12-03 DIAGNOSIS — R609 Edema, unspecified: Secondary | ICD-10-CM | POA: Diagnosis not present

## 2013-12-03 DIAGNOSIS — G9340 Encephalopathy, unspecified: Secondary | ICD-10-CM | POA: Diagnosis not present

## 2013-12-03 DIAGNOSIS — E039 Hypothyroidism, unspecified: Secondary | ICD-10-CM | POA: Diagnosis not present

## 2013-12-04 DIAGNOSIS — R609 Edema, unspecified: Secondary | ICD-10-CM | POA: Diagnosis not present

## 2013-12-04 DIAGNOSIS — F29 Unspecified psychosis not due to a substance or known physiological condition: Secondary | ICD-10-CM | POA: Diagnosis not present

## 2013-12-04 DIAGNOSIS — E039 Hypothyroidism, unspecified: Secondary | ICD-10-CM | POA: Diagnosis not present

## 2013-12-04 DIAGNOSIS — G9340 Encephalopathy, unspecified: Secondary | ICD-10-CM | POA: Diagnosis not present

## 2013-12-04 DIAGNOSIS — J449 Chronic obstructive pulmonary disease, unspecified: Secondary | ICD-10-CM | POA: Diagnosis not present

## 2013-12-09 DIAGNOSIS — R4189 Other symptoms and signs involving cognitive functions and awareness: Secondary | ICD-10-CM | POA: Diagnosis not present

## 2013-12-09 DIAGNOSIS — M79609 Pain in unspecified limb: Secondary | ICD-10-CM | POA: Diagnosis not present

## 2013-12-10 DIAGNOSIS — R05 Cough: Secondary | ICD-10-CM | POA: Diagnosis not present

## 2013-12-10 DIAGNOSIS — R059 Cough, unspecified: Secondary | ICD-10-CM | POA: Diagnosis not present

## 2013-12-10 DIAGNOSIS — R198 Other specified symptoms and signs involving the digestive system and abdomen: Secondary | ICD-10-CM | POA: Diagnosis not present

## 2013-12-10 DIAGNOSIS — M3390 Dermatopolymyositis, unspecified, organ involvement unspecified: Secondary | ICD-10-CM | POA: Diagnosis not present

## 2013-12-10 DIAGNOSIS — R7989 Other specified abnormal findings of blood chemistry: Secondary | ICD-10-CM | POA: Diagnosis not present

## 2013-12-21 ENCOUNTER — Ambulatory Visit: Payer: Medicare Other | Admitting: Podiatrist

## 2013-12-31 DIAGNOSIS — R413 Other amnesia: Secondary | ICD-10-CM | POA: Diagnosis not present

## 2014-01-10 DIAGNOSIS — R4189 Other symptoms and signs involving cognitive functions and awareness: Secondary | ICD-10-CM | POA: Diagnosis not present

## 2014-01-10 DIAGNOSIS — R609 Edema, unspecified: Secondary | ICD-10-CM | POA: Diagnosis not present

## 2014-02-01 ENCOUNTER — Encounter: Payer: Self-pay | Admitting: Podiatrist

## 2014-02-01 ENCOUNTER — Ambulatory Visit (INDEPENDENT_AMBULATORY_CARE_PROVIDER_SITE_OTHER): Payer: Medicare Other | Admitting: Podiatrist

## 2014-02-01 VITALS — BP 155/89 | HR 87 | Resp 18

## 2014-02-01 DIAGNOSIS — B351 Tinea unguium: Secondary | ICD-10-CM | POA: Diagnosis not present

## 2014-02-01 DIAGNOSIS — M79609 Pain in unspecified limb: Secondary | ICD-10-CM | POA: Diagnosis not present

## 2014-02-01 DIAGNOSIS — M79673 Pain in unspecified foot: Secondary | ICD-10-CM

## 2014-02-25 DIAGNOSIS — N390 Urinary tract infection, site not specified: Secondary | ICD-10-CM | POA: Insufficient documentation

## 2014-02-25 DIAGNOSIS — R109 Unspecified abdominal pain: Secondary | ICD-10-CM | POA: Insufficient documentation

## 2014-02-25 DIAGNOSIS — R1011 Right upper quadrant pain: Secondary | ICD-10-CM | POA: Diagnosis not present

## 2014-02-28 ENCOUNTER — Other Ambulatory Visit: Payer: Self-pay | Admitting: Family Medicine

## 2014-02-28 DIAGNOSIS — R1011 Right upper quadrant pain: Secondary | ICD-10-CM

## 2014-03-02 DIAGNOSIS — E559 Vitamin D deficiency, unspecified: Secondary | ICD-10-CM | POA: Diagnosis not present

## 2014-03-02 DIAGNOSIS — M3320 Polymyositis, organ involvement unspecified: Secondary | ICD-10-CM | POA: Diagnosis not present

## 2014-03-02 DIAGNOSIS — M159 Polyosteoarthritis, unspecified: Secondary | ICD-10-CM | POA: Diagnosis not present

## 2014-03-02 DIAGNOSIS — E049 Nontoxic goiter, unspecified: Secondary | ICD-10-CM | POA: Diagnosis not present

## 2014-03-02 DIAGNOSIS — G7249 Other inflammatory and immune myopathies, not elsewhere classified: Secondary | ICD-10-CM | POA: Diagnosis not present

## 2014-03-04 ENCOUNTER — Ambulatory Visit
Admission: RE | Admit: 2014-03-04 | Discharge: 2014-03-04 | Disposition: A | Discharge status: Home / Self Care | Payer: Medicare Other | Source: Ambulatory Visit | Attending: Family Medicine | Admitting: Family Medicine

## 2014-03-04 DIAGNOSIS — K8020 Calculus of gallbladder without cholecystitis without obstruction: Secondary | ICD-10-CM | POA: Diagnosis not present

## 2014-03-04 DIAGNOSIS — R1011 Right upper quadrant pain: Secondary | ICD-10-CM

## 2014-03-07 DIAGNOSIS — E559 Vitamin D deficiency, unspecified: Secondary | ICD-10-CM | POA: Diagnosis not present

## 2014-03-07 DIAGNOSIS — M159 Polyosteoarthritis, unspecified: Secondary | ICD-10-CM | POA: Diagnosis not present

## 2014-03-07 DIAGNOSIS — G7249 Other inflammatory and immune myopathies, not elsewhere classified: Secondary | ICD-10-CM | POA: Diagnosis not present

## 2014-03-15 DIAGNOSIS — G4733 Obstructive sleep apnea (adult) (pediatric): Secondary | ICD-10-CM | POA: Diagnosis not present

## 2014-03-25 ENCOUNTER — Ambulatory Visit (INDEPENDENT_AMBULATORY_CARE_PROVIDER_SITE_OTHER): Payer: Medicare Other | Admitting: Surgery

## 2014-03-25 ENCOUNTER — Encounter (INDEPENDENT_AMBULATORY_CARE_PROVIDER_SITE_OTHER): Payer: Self-pay | Admitting: Surgery

## 2014-03-25 VITALS — BP 138/80 | HR 80 | Resp 18 | Ht 61.0 in

## 2014-03-25 DIAGNOSIS — K8020 Calculus of gallbladder without cholecystitis without obstruction: Secondary | ICD-10-CM | POA: Diagnosis not present

## 2014-03-25 MED ORDER — HYDROCODONE-ACETAMINOPHEN 5-325 MG PO TABS: 1.0000 | ORAL_TABLET | ORAL | Status: DC | PRN

## 2014-03-25 NOTE — Progress Notes (Signed)
[name], female   DOB: 1940/11/04, 73 y.o.   MRN: [id number]  Chief Complaint  Patient presents with  . Other    Eval gallbladder    HPI Janice [name] is a 73 y.o. female.   HPI She is referred by Janice [name] for evaluation of symptomatic cholelithiasis. She had an attack of right upper quadrant abdominal pain after receiving medical therapy for her chronic medical illnesses. This pain persisted for greater than 36 hours. She's been having severe intermittent pain for 3 weeks but it is improving now that she is on a low-fat diet. She had some nausea but no vomiting. Currently, today, she is pain-free. Past Medical History  Diagnosis Date  . Asthma   . Hypertension, benign   . Trigeminal neuralgia   . Spinal stenosis   . Stenosis of right carotid artery   . Hx of appendectomy   . Muscular weakness     Past Surgical History  Procedure Laterality Date  . Tonsillectomy    . Appendectomy    . Knee surgery Right   . X-stop implantation    . Shoulder surgery      Family History  Problem Relation Age of Onset  . Cancer Mother   . Diabetes Maternal Grandmother   . Heart attack Father   . Cancer Father     Social History History  Substance Use Topics  . Smoking status: Never Smoker   . Smokeless tobacco: Never Used  . Alcohol Use: No    Allergies  Allergen Reactions  . Furosemide   . Prednisone Anaphylaxis    psychosis  . Amlodipine Besylate   . Ciprofloxacin Other (See Comments)    Confusion w/psychosis  . Lipitor [Atorvastatin]   . Penicillins Hives  . Sulfa Antibiotics   . Tramadol Other (See Comments)    Confusion w/psychosis  . Welchol [Colesevelam Hcl]     Current Outpatient Prescriptions  Medication Sig Dispense Refill  . Alpha-Lipoic Acid 300 MG CAPS Take 300 mg by mouth daily.      Marland Kitchen aspirin 81 MG tablet Take 81 mg by mouth daily.      Marland Kitchen BENICAR 40 MG tablet Take 40 mg by mouth daily.       . calcium carbonate (OS-CAL) 600  MG TABS Take 600 mg by mouth daily.      . cholecalciferol (VITAMIN D) 1000 UNITS tablet Take 1,000 Units by mouth daily.      Marland Kitchen HYDROcodone-acetaminophen (NORCO/VICODIN) 5-325 MG per tablet       . KLOR-CON M20 20 MEQ tablet 20 mEq.       . montelukast (SINGULAIR) 10 MG tablet Take 10 mg by mouth daily.       Marland Kitchen MYRBETRIQ 50 MG TB24 tablet       . SYNTHROID 125 MCG tablet Take 125 mcg by mouth daily.       . Albuterol Sulfate (VENTOLIN HFA IN) Inhale into the lungs as needed.      . Biotin 1000 MCG tablet Take 500 mg by mouth daily.      . fluticasone (FLONASE) 50 MCG/ACT nasal spray Place into both nostrils daily.      . Melatonin 3 MG TABS Take 3 mg by mouth daily.       No current facility-administered medications for this visit.    Review of Systems Review of Systems  Constitutional: Negative for fever, chills and unexpected weight change.  HENT: Negative for congestion, hearing loss,  sore throat, trouble swallowing and voice change.   Eyes: Negative for visual disturbance.  Respiratory: Negative for cough and wheezing.   Cardiovascular: Negative for chest pain, palpitations and leg swelling.  Gastrointestinal: Positive for nausea, abdominal pain and abdominal distention. Negative for vomiting, diarrhea, constipation, blood in stool and anal bleeding.  Genitourinary: Negative for hematuria, vaginal bleeding and difficulty urinating.  Musculoskeletal: Negative for arthralgias.  Skin: Negative for rash and wound.  Neurological: Negative for seizures, syncope and headaches.  Hematological: Negative for adenopathy. Does not bruise/bleed easily.  Psychiatric/Behavioral: Negative for confusion.    Blood pressure 138/80, pulse 80, resp. rate 18, height 5\' 1"  (1.549 m).  Physical Exam Physical Exam  Constitutional: She is oriented to person, place, and time. She appears well-developed and well-nourished. No distress.  She is in a wheelchair with a brace on her right leg. She is obese   HENT:  Head: Normocephalic and atraumatic.  Right Ear: External ear normal.  Left Ear: External ear normal.  Nose: Nose normal.  Mouth/Throat: Oropharynx is clear and moist.  Eyes: Conjunctivae are normal. Pupils are equal, round, and reactive to light.  Neck: Normal range of motion. Neck supple. No tracheal deviation present.  Cardiovascular: Normal rate and regular rhythm.   Murmur heard. Pulmonary/Chest: Effort normal and breath sounds normal. No respiratory distress. She has no wheezes.  Abdominal: Soft. Bowel sounds are normal. There is no guarding.  Musculoskeletal: She exhibits tenderness. She exhibits no edema.  Neurological: She is alert and oriented to person, place, and time.  Skin: Skin is warm and dry. No rash noted. No erythema.  Psychiatric: Her behavior is normal. Judgment normal.    Data Reviewed Her ultrasound shows cholelithiasis. The bile duct is normal. Liver function tests are normal.  Assessment    Symptomatic cholelithiasis     Plan    Laparoscopic cholecystectomy is recommended. I discussed the procedure in detail.  The patient was given Neurosurgeon.  We discussed the risks and benefits of a laparoscopic cholecystectomy and possible cholangiogram including, but not limited to bleeding, infection, injury to surrounding structures such as the intestine or liver, bile leak, retained gallstones, need to convert to an open procedure, prolonged diarrhea, blood clots such as  DVT, common bile duct injury, anesthesia risks, and possible need for additional procedures.  The likelihood of improvement in symptoms and return to the patient's normal status is good. We discussed the typical post-operative recovery course.        Shaniya Tashiro A 03/25/2014, 11:22 AM

## 2014-03-25 NOTE — Patient Instructions (Signed)
Our schedulers will call you back to schedule surgery

## 2014-03-28 DIAGNOSIS — R269 Unspecified abnormalities of gait and mobility: Secondary | ICD-10-CM | POA: Diagnosis not present

## 2014-03-29 DIAGNOSIS — H2510 Age-related nuclear cataract, unspecified eye: Secondary | ICD-10-CM | POA: Diagnosis not present

## 2014-04-01 DIAGNOSIS — R269 Unspecified abnormalities of gait and mobility: Secondary | ICD-10-CM | POA: Diagnosis not present

## 2014-04-05 DIAGNOSIS — R269 Unspecified abnormalities of gait and mobility: Secondary | ICD-10-CM | POA: Diagnosis not present

## 2014-04-06 ENCOUNTER — Encounter (HOSPITAL_BASED_OUTPATIENT_CLINIC_OR_DEPARTMENT_OTHER): Payer: Self-pay | Admitting: *Deleted

## 2014-04-06 NOTE — Progress Notes (Signed)
Called for dr Tamala Julian notes, also sees dr beekman-rheumatology-and dr at [facility] for muscular disorder To come in for ekg-bmet To bring cpap all meds and info,overnight bag

## 2014-04-07 DIAGNOSIS — R269 Unspecified abnormalities of gait and mobility: Secondary | ICD-10-CM | POA: Diagnosis not present

## 2014-04-08 DIAGNOSIS — I6529 Occlusion and stenosis of unspecified carotid artery: Secondary | ICD-10-CM | POA: Diagnosis not present

## 2014-04-08 DIAGNOSIS — Z79899 Other long term (current) drug therapy: Secondary | ICD-10-CM | POA: Diagnosis not present

## 2014-04-08 DIAGNOSIS — M4800 Spinal stenosis, site unspecified: Secondary | ICD-10-CM | POA: Diagnosis not present

## 2014-04-08 DIAGNOSIS — I10 Essential (primary) hypertension: Secondary | ICD-10-CM | POA: Diagnosis not present

## 2014-04-08 DIAGNOSIS — Z9089 Acquired absence of other organs: Secondary | ICD-10-CM | POA: Diagnosis not present

## 2014-04-08 DIAGNOSIS — Z6841 Body Mass Index (BMI) 40.0 and over, adult: Secondary | ICD-10-CM | POA: Diagnosis not present

## 2014-04-08 DIAGNOSIS — IMO0002 Reserved for concepts with insufficient information to code with codable children: Secondary | ICD-10-CM | POA: Diagnosis not present

## 2014-04-08 DIAGNOSIS — Z9989 Dependence on other enabling machines and devices: Secondary | ICD-10-CM | POA: Diagnosis not present

## 2014-04-08 DIAGNOSIS — Z993 Dependence on wheelchair: Secondary | ICD-10-CM | POA: Diagnosis not present

## 2014-04-08 DIAGNOSIS — K8020 Calculus of gallbladder without cholecystitis without obstruction: Secondary | ICD-10-CM | POA: Diagnosis not present

## 2014-04-08 DIAGNOSIS — Z7982 Long term (current) use of aspirin: Secondary | ICD-10-CM | POA: Diagnosis not present

## 2014-04-08 DIAGNOSIS — R1011 Right upper quadrant pain: Secondary | ICD-10-CM | POA: Diagnosis present

## 2014-04-08 DIAGNOSIS — J45909 Unspecified asthma, uncomplicated: Secondary | ICD-10-CM | POA: Diagnosis not present

## 2014-04-08 DIAGNOSIS — G500 Trigeminal neuralgia: Secondary | ICD-10-CM | POA: Diagnosis not present

## 2014-04-08 DIAGNOSIS — G4733 Obstructive sleep apnea (adult) (pediatric): Secondary | ICD-10-CM | POA: Diagnosis not present

## 2014-04-08 LAB — BASIC METABOLIC PANEL
Anion gap: 12 (ref 5–15)
BUN: 19 mg/dL (ref 6–23)
CHLORIDE: 103 meq/L (ref 96–112)
CO2: 27 meq/L (ref 19–32)
CREATININE: 0.5 mg/dL (ref 0.50–1.10)
Calcium: 9.7 mg/dL (ref 8.4–10.5)
GFR calc Af Amer: >90 mL/min (ref >90)
GFR calc non Af Amer: >90 mL/min (ref >90)
GLUCOSE: 96 mg/dL (ref 70–99)
Potassium: 4.2 mEq/L (ref 3.7–5.3)
Sodium: 142 mEq/L (ref 137–147)

## 2014-04-08 NOTE — Progress Notes (Signed)
Patient here for labs, EKG and anesthesia consult. Dr Ola Spurr talked with patient and felt due to pt living alone and with her limited mobility as well as her OSA and BMI status, she should be done at [place]. Dr Adela Ports office notified, spoke with Maudie Mercury in scheduling.

## 2014-04-11 MED ORDER — LACTATED RINGERS IV SOLN
INTRAVENOUS | Status: DC
  Administered 2014-04-12: 12:00:00 via INTRAVENOUS

## 2014-04-11 MED ORDER — MIDAZOLAM HCL 2 MG/2ML IJ SOLN: 1.0000 mg | INTRAMUSCULAR | Status: DC | PRN

## 2014-04-11 MED ORDER — SODIUM CHLORIDE 0.9 % IV SOLN
1.0000 g | INTRAVENOUS | Status: AC
  Administered 2014-04-12: 1 g via INTRAVENOUS
  Filled 2014-04-11: qty 1

## 2014-04-11 MED ORDER — FENTANYL CITRATE 0.05 MG/ML IJ SOLN: 50.0000 ug | INTRAMUSCULAR | Status: DC | PRN

## 2014-04-11 NOTE — H&P (Signed)
Chief Complaint   Patient presents with   .  Other     Eval gallbladder   HPI  Janice [name] is a 73 y.o. female.  HPI  She is referred by Dr. Carol Ada for evaluation of symptomatic cholelithiasis. She had an attack of right upper quadrant abdominal pain after receiving medical therapy for her chronic medical illnesses. This pain persisted for greater than 36 hours. She's been having severe intermittent pain for 3 weeks but it is improving now that she is on a low-fat diet. She had some nausea but no vomiting. Currently, today, she is pain-free.  Past Medical History   Diagnosis  Date   .  Asthma    .  Hypertension, benign    .  Trigeminal neuralgia    .  Spinal stenosis    .  Stenosis of right carotid artery    .  Hx of appendectomy    .  Muscular weakness     Past Surgical History   Procedure  Laterality  Date   .  Tonsillectomy     .  Appendectomy     .  Knee surgery  Right    .  X-stop implantation     .  Shoulder surgery      Family History   Problem  Relation  Age of Onset   .  Cancer  Mother    .  Diabetes  Maternal Grandmother    .  Heart attack  Father    .  Cancer  Father    Social History  History   Substance Use Topics   .  Smoking status:  Never Smoker   .  Smokeless tobacco:  Never Used   .  Alcohol Use:  No    Allergies   Allergen  Reactions   .  Furosemide    .  Prednisone  Anaphylaxis     psychosis   .  Amlodipine Besylate    .  Ciprofloxacin  Other (See Comments)     Confusion w/psychosis   .  Lipitor [Atorvastatin]    .  Penicillins  Hives   .  Sulfa Antibiotics    .  Tramadol  Other (See Comments)     Confusion w/psychosis   .  Welchol [Colesevelam Hcl]     Current Outpatient Prescriptions   Medication  Sig  Dispense  Refill   .  Alpha-Lipoic Acid 300 MG CAPS  Take 300 mg by mouth daily.     Marland Kitchen  aspirin 81 MG tablet  Take 81 mg by mouth daily.     Marland Kitchen  BENICAR 40 MG tablet  Take 40 mg by mouth daily.     .  calcium carbonate  (OS-CAL) 600 MG TABS  Take 600 mg by mouth daily.     .  cholecalciferol (VITAMIN D) 1000 UNITS tablet  Take 1,000 Units by mouth daily.     Marland Kitchen  HYDROcodone-acetaminophen (NORCO/VICODIN) 5-325 MG per tablet      .  KLOR-CON M20 20 MEQ tablet  20 mEq.     .  montelukast (SINGULAIR) 10 MG tablet  Take 10 mg by mouth daily.     Marland Kitchen  MYRBETRIQ 50 MG TB24 tablet      .  SYNTHROID 125 MCG tablet  Take 125 mcg by mouth daily.     .  Albuterol Sulfate (VENTOLIN HFA IN)  Inhale into the lungs as needed.     .  Biotin 1000  MCG tablet  Take 500 mg by mouth daily.     .  fluticasone (FLONASE) 50 MCG/ACT nasal spray  Place into both nostrils daily.     .  Melatonin 3 MG TABS  Take 3 mg by mouth daily.      No current facility-administered medications for this visit.   Review of Systems  Review of Systems  Constitutional: Negative for fever, chills and unexpected weight change.  HENT: Negative for congestion, hearing loss, sore throat, trouble swallowing and voice change.  Eyes: Negative for visual disturbance.  Respiratory: Negative for cough and wheezing.  Cardiovascular: Negative for chest pain, palpitations and leg swelling.  Gastrointestinal: Positive for nausea, abdominal pain and abdominal distention. Negative for vomiting, diarrhea, constipation, blood in stool and anal bleeding.  Genitourinary: Negative for hematuria, vaginal bleeding and difficulty urinating.  Musculoskeletal: Negative for arthralgias.  Skin: Negative for rash and wound.  Neurological: Negative for seizures, syncope and headaches.  Hematological: Negative for adenopathy. Does not bruise/bleed easily.  Psychiatric/Behavioral: Negative for confusion.  Blood pressure 138/80, pulse 80, resp. rate 18, height 5\' 1"  (1.549 m).  Physical Exam  Physical Exam  Constitutional: She is oriented to person, place, and time. She appears well-developed and well-nourished. No distress.  She is in a wheelchair with a brace on her right leg. She  is obese  HENT:  Head: Normocephalic and atraumatic.  Right Ear: External ear normal.  Left Ear: External ear normal.  Nose: Nose normal.  Mouth/Throat: Oropharynx is clear and moist.  Eyes: Conjunctivae are normal. Pupils are equal, round, and reactive to light.  Neck: Normal range of motion. Neck supple. No tracheal deviation present.  Cardiovascular: Normal rate and regular rhythm.  Murmur heard.  Pulmonary/Chest: Effort normal and breath sounds normal. No respiratory distress. She has no wheezes.  Abdominal: Soft. Bowel sounds are normal. There is no guarding.  Musculoskeletal: She exhibits tenderness. She exhibits no edema.  Neurological: She is alert and oriented to person, place, and time.  Skin: Skin is warm and dry. No rash noted. No erythema.  Psychiatric: Her behavior is normal. Judgment normal.  Data Reviewed  Her ultrasound shows cholelithiasis. The bile duct is normal. Liver function tests are normal.  Assessment  Symptomatic cholelithiasis  Plan  Laparoscopic cholecystectomy is recommended. I discussed the procedure in detail. The patient was given Neurosurgeon. We discussed the risks and benefits of a laparoscopic cholecystectomy and possible cholangiogram including, but not limited to bleeding, infection, injury to surrounding structures such as the intestine or liver, bile leak, retained gallstones, need to convert to an open procedure, prolonged diarrhea, blood clots such as DVT, common bile duct injury, anesthesia risks, and possible need for additional procedures. The likelihood of improvement in symptoms and return to the patient's normal status is good. We discussed the typical post-operative recovery course.

## 2014-04-12 ENCOUNTER — Ambulatory Visit (HOSPITAL_COMMUNITY): Payer: Medicare Other

## 2014-04-12 ENCOUNTER — Encounter (HOSPITAL_COMMUNITY): Payer: Self-pay | Admitting: *Deleted

## 2014-04-12 ENCOUNTER — Encounter (HOSPITAL_COMMUNITY)
Admission: RE | Disposition: A | Discharge status: Home / Self Care | Payer: Self-pay | Source: Ambulatory Visit | Attending: Surgery

## 2014-04-12 ENCOUNTER — Ambulatory Visit (HOSPITAL_COMMUNITY): Payer: Medicare Other | Admitting: Anesthesiology

## 2014-04-12 ENCOUNTER — Observation Stay (HOSPITAL_COMMUNITY)
Admission: RE | Admit: 2014-04-12 | Discharge: 2014-04-13 | Disposition: A | Discharge status: Home / Self Care | Payer: Medicare Other | Source: Ambulatory Visit | Attending: Surgery | Admitting: Surgery

## 2014-04-12 ENCOUNTER — Encounter (HOSPITAL_COMMUNITY): Payer: Medicare Other | Admitting: Anesthesiology

## 2014-04-12 DIAGNOSIS — G500 Trigeminal neuralgia: Secondary | ICD-10-CM | POA: Insufficient documentation

## 2014-04-12 DIAGNOSIS — M6281 Muscle weakness (generalized): Secondary | ICD-10-CM | POA: Diagnosis not present

## 2014-04-12 DIAGNOSIS — M4800 Spinal stenosis, site unspecified: Secondary | ICD-10-CM | POA: Insufficient documentation

## 2014-04-12 DIAGNOSIS — Z7982 Long term (current) use of aspirin: Secondary | ICD-10-CM | POA: Insufficient documentation

## 2014-04-12 DIAGNOSIS — K8010 Calculus of gallbladder with chronic cholecystitis without obstruction: Secondary | ICD-10-CM | POA: Diagnosis not present

## 2014-04-12 DIAGNOSIS — I6529 Occlusion and stenosis of unspecified carotid artery: Secondary | ICD-10-CM | POA: Insufficient documentation

## 2014-04-12 DIAGNOSIS — IMO0002 Reserved for concepts with insufficient information to code with codable children: Secondary | ICD-10-CM | POA: Insufficient documentation

## 2014-04-12 DIAGNOSIS — Z6841 Body Mass Index (BMI) 40.0 and over, adult: Secondary | ICD-10-CM | POA: Insufficient documentation

## 2014-04-12 DIAGNOSIS — I10 Essential (primary) hypertension: Secondary | ICD-10-CM | POA: Diagnosis not present

## 2014-04-12 DIAGNOSIS — Z9989 Dependence on other enabling machines and devices: Secondary | ICD-10-CM | POA: Insufficient documentation

## 2014-04-12 DIAGNOSIS — Z993 Dependence on wheelchair: Secondary | ICD-10-CM | POA: Insufficient documentation

## 2014-04-12 DIAGNOSIS — Z9089 Acquired absence of other organs: Secondary | ICD-10-CM | POA: Insufficient documentation

## 2014-04-12 DIAGNOSIS — Z01818 Encounter for other preprocedural examination: Secondary | ICD-10-CM | POA: Diagnosis not present

## 2014-04-12 DIAGNOSIS — K8020 Calculus of gallbladder without cholecystitis without obstruction: Secondary | ICD-10-CM | POA: Diagnosis not present

## 2014-04-12 DIAGNOSIS — J45909 Unspecified asthma, uncomplicated: Secondary | ICD-10-CM | POA: Insufficient documentation

## 2014-04-12 DIAGNOSIS — G4733 Obstructive sleep apnea (adult) (pediatric): Secondary | ICD-10-CM | POA: Insufficient documentation

## 2014-04-12 DIAGNOSIS — Z79899 Other long term (current) drug therapy: Secondary | ICD-10-CM | POA: Insufficient documentation

## 2014-04-12 HISTORY — PX: CHOLECYSTECTOMY: SHX55

## 2014-04-12 HISTORY — DX: Cardiac murmur, unspecified: R01.1

## 2014-04-12 HISTORY — DX: Sleep apnea, unspecified: G47.30

## 2014-04-12 HISTORY — DX: Sciatica, unspecified side: M54.30

## 2014-04-12 HISTORY — DX: Presence of spectacles and contact lenses: Z97.3

## 2014-04-12 HISTORY — DX: Family history of other specified conditions: Z84.89

## 2014-04-12 LAB — CBC
HEMATOCRIT: 35.6 % — AB (ref 36.0–46.0)
HEMOGLOBIN: 11.7 g/dL — AB (ref 12.0–15.0)
MCH: 26.5 pg (ref 26.0–34.0)
MCHC: 32.9 g/dL (ref 30.0–36.0)
MCV: 80.7 fL (ref 78.0–100.0)
Platelets: 257 10*3/uL (ref 150–400)
RBC: 4.41 MIL/uL (ref 3.87–5.11)
RDW: 13.4 % (ref 11.5–15.5)
WBC: 4.8 10*3/uL (ref 4.0–10.5)

## 2014-04-12 SURGERY — LAPAROSCOPIC CHOLECYSTECTOMY
Anesthesia: General

## 2014-04-12 MED ORDER — BUPIVACAINE-EPINEPHRINE (PF) 0.25% -1:200000 IJ SOLN
INTRAMUSCULAR | Status: AC
  Filled 2014-04-12: qty 30

## 2014-04-12 MED ORDER — GLYCOPYRROLATE 0.2 MG/ML IJ SOLN
INTRAMUSCULAR | Status: DC | PRN
  Administered 2014-04-12: 0.4 mg via INTRAVENOUS

## 2014-04-12 MED ORDER — ALBUTEROL SULFATE (2.5 MG/3ML) 0.083% IN NEBU: 3.0000 mL | INHALATION_SOLUTION | RESPIRATORY_TRACT | Status: DC | PRN

## 2014-04-12 MED ORDER — ENOXAPARIN SODIUM 40 MG/0.4ML ~~LOC~~ SOLN
40.0000 mg | SUBCUTANEOUS | Status: DC
  Administered 2014-04-13: 40 mg via SUBCUTANEOUS
  Filled 2014-04-12 (×2): qty 0.4

## 2014-04-12 MED ORDER — LEVOTHYROXINE SODIUM 125 MCG PO TABS
125.0000 ug | ORAL_TABLET | Freq: Every day | ORAL | Status: DC
  Administered 2014-04-13: 125 ug via ORAL
  Filled 2014-04-12 (×2): qty 1

## 2014-04-12 MED ORDER — HYDROMORPHONE HCL PF 1 MG/ML IJ SOLN
INTRAMUSCULAR | Status: AC
  Filled 2014-04-12: qty 1

## 2014-04-12 MED ORDER — HYDROCODONE-ACETAMINOPHEN 5-325 MG PO TABS
1.0000 | ORAL_TABLET | ORAL | Status: DC | PRN
  Administered 2014-04-12: 1 via ORAL
  Filled 2014-04-12: qty 1

## 2014-04-12 MED ORDER — VECURONIUM BROMIDE 10 MG IV SOLR
INTRAVENOUS | Status: DC | PRN
  Administered 2014-04-12: 4 mg via INTRAVENOUS

## 2014-04-12 MED ORDER — NEOSTIGMINE METHYLSULFATE 10 MG/10ML IV SOLN
INTRAVENOUS | Status: DC | PRN
  Administered 2014-04-12: 2 mg via INTRAVENOUS

## 2014-04-12 MED ORDER — LIDOCAINE HCL (CARDIAC) 20 MG/ML IV SOLN
INTRAVENOUS | Status: DC | PRN
  Administered 2014-04-12: 40 mg via INTRAVENOUS

## 2014-04-12 MED ORDER — FESOTERODINE FUMARATE ER 8 MG PO TB24
8.0000 mg | ORAL_TABLET | Freq: Every day | ORAL | Status: DC
  Administered 2014-04-13: 8 mg via ORAL
  Filled 2014-04-12 (×2): qty 1

## 2014-04-12 MED ORDER — MIDAZOLAM HCL 2 MG/2ML IJ SOLN
INTRAMUSCULAR | Status: AC
  Filled 2014-04-12: qty 2

## 2014-04-12 MED ORDER — POTASSIUM CHLORIDE IN NACL 20-0.9 MEQ/L-% IV SOLN
INTRAVENOUS | Status: DC
  Administered 2014-04-12 – 2014-04-13 (×2): via INTRAVENOUS
  Filled 2014-04-12 (×4): qty 1000

## 2014-04-12 MED ORDER — ROCURONIUM BROMIDE 50 MG/5ML IV SOLN
INTRAVENOUS | Status: AC
  Filled 2014-04-12: qty 1

## 2014-04-12 MED ORDER — FENTANYL CITRATE 0.05 MG/ML IJ SOLN
INTRAMUSCULAR | Status: AC
  Filled 2014-04-12: qty 5

## 2014-04-12 MED ORDER — BUPIVACAINE-EPINEPHRINE 0.25% -1:200000 IJ SOLN
INTRAMUSCULAR | Status: DC | PRN
  Administered 2014-04-12: 20 mL

## 2014-04-12 MED ORDER — HYDROMORPHONE HCL PF 1 MG/ML IJ SOLN
0.5000 mg | INTRAMUSCULAR | Status: DC | PRN
  Administered 2014-04-12 (×4): 0.5 mg via INTRAVENOUS

## 2014-04-12 MED ORDER — SCOPOLAMINE 1 MG/3DAYS TD PT72
MEDICATED_PATCH | TRANSDERMAL | Status: DC | PRN
  Administered 2014-04-12: 1 via TRANSDERMAL

## 2014-04-12 MED ORDER — ONDANSETRON HCL 4 MG PO TABS: 4.0000 mg | ORAL_TABLET | Freq: Four times a day (QID) | ORAL | Status: DC | PRN

## 2014-04-12 MED ORDER — VECURONIUM BROMIDE 10 MG IV SOLR
INTRAVENOUS | Status: AC
  Filled 2014-04-12: qty 10

## 2014-04-12 MED ORDER — ALBUTEROL SULFATE (2.5 MG/3ML) 0.083% IN NEBU
INHALATION_SOLUTION | RESPIRATORY_TRACT | Status: AC
  Administered 2014-04-12: 2.5 mg
  Filled 2014-04-12: qty 3

## 2014-04-12 MED ORDER — PROPOFOL 10 MG/ML IV BOLUS
INTRAVENOUS | Status: AC
  Filled 2014-04-12: qty 20

## 2014-04-12 MED ORDER — PROPOFOL 10 MG/ML IV BOLUS
INTRAVENOUS | Status: DC | PRN
  Administered 2014-04-12: 150 mg via INTRAVENOUS

## 2014-04-12 MED ORDER — 0.9 % SODIUM CHLORIDE (POUR BTL) OPTIME
TOPICAL | Status: DC | PRN
  Administered 2014-04-12: 1000 mL

## 2014-04-12 MED ORDER — IBUPROFEN 600 MG PO TABS: 600.0000 mg | ORAL_TABLET | Freq: Four times a day (QID) | ORAL | Status: DC | PRN

## 2014-04-12 MED ORDER — ONDANSETRON HCL 4 MG/2ML IJ SOLN: 4.0000 mg | Freq: Four times a day (QID) | INTRAMUSCULAR | Status: DC | PRN

## 2014-04-12 MED ORDER — GUAIFENESIN ER 600 MG PO TB12
600.0000 mg | ORAL_TABLET | Freq: Two times a day (BID) | ORAL | Status: DC
  Administered 2014-04-12 – 2014-04-13 (×3): 600 mg via ORAL
  Filled 2014-04-12 (×5): qty 1

## 2014-04-12 MED ORDER — LACTATED RINGERS IV SOLN: INTRAVENOUS | Status: DC

## 2014-04-12 MED ORDER — ONDANSETRON HCL 4 MG/2ML IJ SOLN: 4.0000 mg | Freq: Once | INTRAMUSCULAR | Status: DC | PRN

## 2014-04-12 MED ORDER — ONDANSETRON HCL 4 MG/2ML IJ SOLN
INTRAMUSCULAR | Status: DC | PRN
  Administered 2014-04-12: 4 mg via INTRAVENOUS

## 2014-04-12 MED ORDER — FENTANYL CITRATE 0.05 MG/ML IJ SOLN: 25.0000 ug | INTRAMUSCULAR | Status: DC | PRN

## 2014-04-12 MED ORDER — DEXAMETHASONE SODIUM PHOSPHATE 10 MG/ML IJ SOLN
INTRAMUSCULAR | Status: AC
  Filled 2014-04-12: qty 1

## 2014-04-12 MED ORDER — MORPHINE SULFATE 2 MG/ML IJ SOLN: 1.0000 mg | INTRAMUSCULAR | Status: DC | PRN

## 2014-04-12 MED ORDER — LIDOCAINE HCL (CARDIAC) 20 MG/ML IV SOLN
INTRAVENOUS | Status: AC
  Filled 2014-04-12: qty 5

## 2014-04-12 MED ORDER — GLYCOPYRROLATE 0.2 MG/ML IJ SOLN
INTRAMUSCULAR | Status: AC
  Filled 2014-04-12: qty 4

## 2014-04-12 MED ORDER — FENTANYL CITRATE 0.05 MG/ML IJ SOLN
INTRAMUSCULAR | Status: DC | PRN
  Administered 2014-04-12 (×2): 50 ug via INTRAVENOUS
  Administered 2014-04-12: 100 ug via INTRAVENOUS
  Administered 2014-04-12: 50 ug via INTRAVENOUS

## 2014-04-12 MED ORDER — ONDANSETRON HCL 4 MG/2ML IJ SOLN
INTRAMUSCULAR | Status: AC
  Filled 2014-04-12: qty 2

## 2014-04-12 MED ORDER — NEOSTIGMINE METHYLSULFATE 10 MG/10ML IV SOLN
INTRAVENOUS | Status: AC
  Filled 2014-04-12: qty 1

## 2014-04-12 MED ORDER — MIDAZOLAM HCL 5 MG/5ML IJ SOLN
INTRAMUSCULAR | Status: DC | PRN
  Administered 2014-04-12: 1 mg via INTRAVENOUS

## 2014-04-12 MED ORDER — SCOPOLAMINE 1 MG/3DAYS TD PT72
MEDICATED_PATCH | TRANSDERMAL | Status: AC
  Filled 2014-04-12: qty 1

## 2014-04-12 MED ORDER — IRBESARTAN 300 MG PO TABS
300.0000 mg | ORAL_TABLET | Freq: Every day | ORAL | Status: DC
  Administered 2014-04-12: 300 mg via ORAL
  Filled 2014-04-12 (×3): qty 1

## 2014-04-12 MED ORDER — SODIUM CHLORIDE 0.9 % IR SOLN
Status: DC | PRN
  Administered 2014-04-12: 1000 mL

## 2014-04-12 SURGICAL SUPPLY — 39 items
APL SKNCLS STERI-STRIP NONHPOA (GAUZE/BANDAGES/DRESSINGS) ×1
APPLIER CLIP 5 13 M/L LIGAMAX5 (MISCELLANEOUS) ×2
APR CLP MED LRG 5 ANG JAW (MISCELLANEOUS) ×1
BAG SPEC RTRVL LRG 6X4 10 (ENDOMECHANICALS)
BANDAGE ADH SHEER 1  50/CT (GAUZE/BANDAGES/DRESSINGS) ×8 IMPLANT
BENZOIN TINCTURE PRP APPL 2/3 (GAUZE/BANDAGES/DRESSINGS) ×2 IMPLANT
CANISTER SUCTION 2500CC (MISCELLANEOUS) ×2 IMPLANT
CHLORAPREP W/TINT 26ML (MISCELLANEOUS) ×2 IMPLANT
CLIP APPLIE 5 13 M/L LIGAMAX5 (MISCELLANEOUS) ×1 IMPLANT
COVER MAYO STAND STRL (DRAPES) IMPLANT
COVER SURGICAL LIGHT HANDLE (MISCELLANEOUS) ×2 IMPLANT
DECANTER SPIKE VIAL GLASS SM (MISCELLANEOUS) ×2 IMPLANT
DRAPE C-ARM 42X72 X-RAY (DRAPES) IMPLANT
DRAPE UTILITY 15X26 W/TAPE STR (DRAPE) ×4 IMPLANT
ELECT REM PT RETURN 9FT ADLT (ELECTROSURGICAL) ×2
ELECTRODE REM PT RTRN 9FT ADLT (ELECTROSURGICAL) ×1 IMPLANT
GLOVE SURG SIGNA 7.5 PF LTX (GLOVE) ×2 IMPLANT
GOWN STRL REUS W/ TWL LRG LVL3 (GOWN DISPOSABLE) ×3 IMPLANT
GOWN STRL REUS W/ TWL XL LVL3 (GOWN DISPOSABLE) ×1 IMPLANT
GOWN STRL REUS W/TWL LRG LVL3 (GOWN DISPOSABLE) ×6
GOWN STRL REUS W/TWL XL LVL3 (GOWN DISPOSABLE) ×2
KIT BASIN OR (CUSTOM PROCEDURE TRAY) ×2 IMPLANT
KIT ROOM TURNOVER OR (KITS) ×2 IMPLANT
NS IRRIG 1000ML POUR BTL (IV SOLUTION) ×2 IMPLANT
PAD ARMBOARD 7.5X6 YLW CONV (MISCELLANEOUS) ×2 IMPLANT
POUCH SPECIMEN RETRIEVAL 10MM (ENDOMECHANICALS) IMPLANT
SCISSORS LAP 5X35 DISP (ENDOMECHANICALS) ×2 IMPLANT
SET CHOLANGIOGRAPH 5 50 .035 (SET/KITS/TRAYS/PACK) IMPLANT
SET IRRIG TUBING LAPAROSCOPIC (IRRIGATION / IRRIGATOR) ×2 IMPLANT
SLEEVE ENDOPATH XCEL 5M (ENDOMECHANICALS) ×4 IMPLANT
SPECIMEN JAR SMALL (MISCELLANEOUS) ×2 IMPLANT
STRIP CLOSURE SKIN 1/2X4 (GAUZE/BANDAGES/DRESSINGS) ×1 IMPLANT
SUT MON AB 4-0 PC3 18 (SUTURE) ×2 IMPLANT
TOWEL OR 17X24 6PK STRL BLUE (TOWEL DISPOSABLE) ×2 IMPLANT
TOWEL OR 17X26 10 PK STRL BLUE (TOWEL DISPOSABLE) ×2 IMPLANT
TRAY LAPAROSCOPIC (CUSTOM PROCEDURE TRAY) ×2 IMPLANT
TROCAR XCEL BLUNT TIP 100MML (ENDOMECHANICALS) ×2 IMPLANT
TROCAR XCEL NON-BLD 5MMX100MML (ENDOMECHANICALS) ×2 IMPLANT
WATER STERILE IRR 1000ML POUR (IV SOLUTION) IMPLANT

## 2014-04-12 NOTE — Anesthesia Postprocedure Evaluation (Signed)
  Anesthesia Post-op Note  Patient: Janice [name]  Procedure(s) Performed: Procedure(s): LAPAROSCOPIC CHOLECYSTECTOMY (N/A)  Patient Location: PACU  Anesthesia Type:General  Level of Consciousness: awake, alert  and oriented  Airway and Oxygen Therapy: Patient Spontanous Breathing, Patient connected to nasal cannula oxygen, Patient connected to T-piece oxygen and Patient placed on Ventilator (see vital sign flow sheet for setting)  Post-op Pain: mild  Post-op Assessment: Post-op Vital signs reviewed, Patient's Cardiovascular Status Stable, Respiratory Function Stable, Patent Airway, No signs of Nausea or vomiting and Adequate PO intake  Post-op Vital Signs: stable  Last Vitals:  Filed Vitals:   04/12/14 1315  BP: 118/74  Pulse: 64  Temp:   Resp: 15    Complications: No apparent anesthesia complications

## 2014-04-12 NOTE — Op Note (Signed)
Laparoscopic Cholecystectomy Procedure Note  Indications: This patient presents with symptomatic gallbladder disease and will undergo laparoscopic cholecystectomy.  Pre-operative Diagnosis: Calculus of gallbladder without mention of cholecystitis or obstruction  Post-operative Diagnosis: Same  Surgeon: Coralie Keens A   Assistants: 0  Anesthesia: General endotracheal anesthesia  ASA Class: 3  Procedure Details  The patient was seen again in the Holding Room. The risks, benefits, complications, treatment options, and expected outcomes were discussed with the patient. The possibilities of reaction to medication, pulmonary aspiration, perforation of viscus, bleeding, recurrent infection, finding a normal gallbladder, the need for additional procedures, failure to diagnose a condition, the possible need to convert to an open procedure, and creating a complication requiring transfusion or operation were discussed with the patient. The likelihood of improving the patient's symptoms with return to their baseline status is good.  The patient and/or family concurred with the proposed plan, giving informed consent. The site of surgery properly noted. The patient was taken to Operating Room, identified as Janice [name] and the procedure verified as Laparoscopic Cholecystectomy with Intraoperative Cholangiogram. A Time Out was held and the above information confirmed.  Prior to the induction of general anesthesia, antibiotic prophylaxis was administered. General endotracheal anesthesia was then administered and tolerated well. After the induction, the abdomen was prepped with Chloraprep and draped in sterile fashion. The patient was positioned in the supine position.  Local anesthetic agent was injected into the skin near the umbilicus and an incision made. We dissected down to the abdominal fascia with blunt dissection.  The fascia was incised vertically and we entered the peritoneal cavity bluntly.   A pursestring suture of 0-Vicryl was placed around the fascial opening.  The Hasson cannula was inserted and secured with the stay suture.  Pneumoperitoneum was then created with CO2 and tolerated well without any adverse changes in the patient's vital signs. An 11-mm port was placed in the subxiphoid position.  Two 5-mm ports were placed in the right upper quadrant. All skin incisions were infiltrated with a local anesthetic agent before making the incision and placing the trocars.   We positioned the patient in reverse Trendelenburg, tilted slightly to the patient's left.  The gallbladder was identified, the fundus grasped and retracted cephalad. Adhesions were lysed bluntly and with the electrocautery where indicated, taking care not to injure any adjacent organs or viscus. The infundibulum was grasped and retracted laterally, exposing the peritoneum overlying the triangle of Calot. This was then divided and exposed in a blunt fashion. The cystic duct was clearly identified and bluntly dissected circumferentially. A critical view of the cystic duct and cystic artery was obtained.  The cystic duct was then ligated with clips and divided. The cystic artery was, dissected free, ligated with clips and divided as well.   The gallbladder was dissected from the liver bed in retrograde fashion with the electrocautery. The gallbladder was removed and placed in an Endocatch sac. The liver bed was irrigated and inspected. Hemostasis was achieved with the electrocautery. Copious irrigation was utilized and was repeatedly aspirated until clear.  The gallbladder and Endocatch sac were then removed through the umbilical port site.  The pursestring suture was used to close the umbilical fascia.    We again inspected the right upper quadrant for hemostasis.  Pneumoperitoneum was released as we removed the trocars.  4-0 Monocryl was used to close the skin.   Benzoin, steri-strips, and clean dressings were applied. The  patient was then extubated and brought to the recovery room  in stable condition. Instrument, sponge, and needle counts were correct at closure and at the conclusion of the case.   Findings: Cholecystitis with Cholelithiasis  Estimated Blood Loss: Minimal         Drains: 0         Specimens: Gallbladder           Complications: None; patient tolerated the procedure well.         Disposition: PACU - hemodynamically stable.         Condition: stable

## 2014-04-12 NOTE — Transfer of Care (Signed)
Immediate Anesthesia Transfer of Care Note  Patient: Janice [name]  Procedure(s) Performed: Procedure(s): LAPAROSCOPIC CHOLECYSTECTOMY (N/A)  Patient Location: PACU  Anesthesia Type:General  Level of Consciousness: awake  Airway & Oxygen Therapy: Patient Spontanous Breathing and Patient connected to nasal cannula oxygen  Post-op Assessment: Report given to PACU RN and Post -op Vital signs reviewed and stable  Post vital signs: stable  Complications: No apparent anesthesia complications

## 2014-04-12 NOTE — Interval H&P Note (Signed)
History and Physical Interval Note: no change in H and P  04/12/2014 9:59 AM  Janice [name]  has presented today for surgery, with the diagnosis of Symptomatic Cholelithiasis  The various methods of treatment have been discussed with the patient and family. After consideration of risks, benefits and other options for treatment, the patient has consented to  Procedure(s): LAPAROSCOPIC CHOLECYSTECTOMY (N/A) as a surgical intervention .  The patient's history has been reviewed, patient examined, no change in status, stable for surgery.  I have reviewed the patient's chart and labs.  Questions were answered to the patient's satisfaction.     Leshawn Straka A

## 2014-04-12 NOTE — Anesthesia Preprocedure Evaluation (Signed)
Anesthesia Evaluation  Patient identified by MRN, date of birth, ID band Patient awake    Reviewed: Allergy & Precautions, H&P , NPO status , Patient's Chart, lab work & pertinent test results  Airway Mallampati: II TM Distance: >3 FB Neck ROM: Full    Dental  (+) Teeth Intact   Pulmonary asthma , sleep apnea ,          Cardiovascular hypertension,     Neuro/Psych  Neuromuscular disease    GI/Hepatic   Endo/Other    Renal/GU      Musculoskeletal   Abdominal   Peds  Hematology   Anesthesia Other Findings   Reproductive/Obstetrics                           Anesthesia Physical Anesthesia Plan  ASA: III  Anesthesia Plan: General   Post-op Pain Management:    Induction: Intravenous  Airway Management Planned: Oral ETT  Additional Equipment:   Intra-op Plan:   Post-operative Plan: Extubation in OR  Informed Consent: I have reviewed the patients History and Physical, chart, labs and discussed the procedure including the risks, benefits and alternatives for the proposed anesthesia with the patient or authorized representative who has indicated his/her understanding and acceptance.   Dental advisory given  Plan Discussed with: CRNA  Anesthesia Plan Comments:         Anesthesia Quick Evaluation

## 2014-04-13 DIAGNOSIS — K8020 Calculus of gallbladder without cholecystitis without obstruction: Secondary | ICD-10-CM | POA: Diagnosis not present

## 2014-04-13 DIAGNOSIS — I10 Essential (primary) hypertension: Secondary | ICD-10-CM | POA: Diagnosis not present

## 2014-04-13 DIAGNOSIS — I6529 Occlusion and stenosis of unspecified carotid artery: Secondary | ICD-10-CM | POA: Diagnosis not present

## 2014-04-13 DIAGNOSIS — M4800 Spinal stenosis, site unspecified: Secondary | ICD-10-CM | POA: Diagnosis not present

## 2014-04-13 DIAGNOSIS — J45909 Unspecified asthma, uncomplicated: Secondary | ICD-10-CM | POA: Diagnosis not present

## 2014-04-13 DIAGNOSIS — G500 Trigeminal neuralgia: Secondary | ICD-10-CM | POA: Diagnosis not present

## 2014-04-13 MED ORDER — HYDROCODONE-ACETAMINOPHEN 5-325 MG PO TABS: 1.0000 | ORAL_TABLET | ORAL | Status: DC | PRN

## 2014-04-13 NOTE — Progress Notes (Signed)
Patient ID: TINEY ZIPPER, female   DOB: [birth year], 73 y.o.   MRN: [id number] I was asked to evaluate the patient prior to discharge as when she was removing her SCDs she noted pain, erythema, and edema of her right lower extremity.  She has a history of lower extremity cellulitis about 4 months ago.  She states that this was not present yesterday upon admission.    PE: Ext: right lower extremity with some erythema of her anterior tibial space tracking medial and lateral around her calf.  She has increased edema per her usual swelling.  This is warm to the touch and she is tender to palpation along the anterior portion of her calf as well as posterior calf.  A/P: 1. Superficial cellulitis vs DVT  Patient has been on lovenox and SCDs, but will obtain a doppler US to rule out DVT.  Her discharge has been cancelled as of right now until we can get these results.  If this is just superficial cellulitis, she can likely still go home with oral abx therapy.  I have spoken to Dr. Ninfa Linden about this as well.  He agrees with the plan.  Patient also understands and agrees.  Bernd Crom E 3:18 PM 04/13/2014

## 2014-04-13 NOTE — Progress Notes (Signed)
Pt developed increased warmth/redness/swelling/tenderness in RLL.  Pt stated she thought it was just a rash from her SCD's.  RLL elevated and on call MD notified.

## 2014-04-13 NOTE — Discharge Summary (Signed)
Physician Discharge Summary  [name] MRN: [id number] DOB/AGE: 74-Dec-1942 74 y.o.  Admit date: 04/12/2014 Discharge date: 04/13/2014  Admission Diagnoses:  Discharge Diagnoses:  Active Problems:   Symptomatic cholelithiasis   Discharged Condition: good  Hospital Course: uneventful post op recovery s/p lap chole  Consults: None  Significant Diagnostic Studies:   Treatments: surgery: lap chole  Discharge Exam: Blood pressure 129/52, pulse 80, temperature 98.1 F (36.7 C), temperature source Oral, resp. rate 20, height 5\' 1"  (1.549 m), weight 260 lb (117.935 kg), SpO2 95.00%. General appearance: alert, cooperative and no distress Resp: clear to auscultation bilaterally Cardio: regular rate and rhythm, S1, S2 normal, no murmur, click, rub or gallop abdomen soft, minimally tender  Disposition: Final discharge disposition not confirmed     Medication List         Alpha-Lipoic Acid 300 MG Caps  Take 300 mg by mouth daily.     aspirin 81 MG tablet  Take 81 mg by mouth daily.     BENICAR 40 MG tablet  Generic drug:  olmesartan  Take 40 mg by mouth daily.     Biotin 1000 MCG tablet  Take 500 mg by mouth daily.     calcium carbonate 600 MG Tabs tablet  Commonly known as:  OS-CAL  Take 600 mg by mouth daily.     cholecalciferol 1000 UNITS tablet  Commonly known as:  VITAMIN D  Take 2,000 Units by mouth daily.     guaiFENesin 600 MG 12 hr tablet  Commonly known as:  MUCINEX  Take 600 mg by mouth 2 (two) times daily.     HYDROcodone-acetaminophen 5-325 MG per tablet  Commonly known as:  NORCO/VICODIN  Take 1-2 tablets by mouth every 4 (four) hours as needed for moderate pain.     KLOR-CON M20 20 MEQ tablet  Generic drug:  potassium chloride SA  Take 20 mEq by mouth daily.     Magnesium 100 MG Caps  Take 100 mg by mouth daily.     Melatonin 3 MG Tabs  Take 3 mg by mouth daily.     montelukast 10 MG tablet  Commonly known as:  SINGULAIR   Take 10 mg by mouth daily.     naproxen sodium 220 MG tablet  Commonly known as:  ANAPROX  Take 220 mg by mouth 3 (three) times daily with meals.     PRESCRIPTION MEDICATION  Take 500 mg by mouth 2 (two) times daily. microlactin     SYNTHROID 125 MCG tablet  Generic drug:  levothyroxine  Take 125 mcg by mouth daily.     TOVIAZ 8 MG Tb24 tablet  Generic drug:  fesoterodine  Take 8 mg by mouth daily.     VENTOLIN HFA IN  Inhale 2 puffs into the lungs every 4 (four) hours as needed (shortness of breath).           Follow-up Information   Follow up with [facility], [place] A, MD. Schedule an appointment as soon as possible for a visit in 3 weeks.   Specialty:  General Surgery   Contact information:   [address] [place] [address] [phone]       Signed: Harl Bowie 04/13/2014, 8:11 AM

## 2014-04-13 NOTE — Progress Notes (Signed)
[name], female   DOB: Oct 20, 1940, 73 y.o.   MRN: [id number]  Doing well post op  Discharge home

## 2014-04-13 NOTE — Progress Notes (Signed)
Pt dc to home by car with sister.  Pt verbalized understanding of discharge instructions.  Prescriptions given. VS stable. RLL edema addressed with MD and he proceeded with discharge.

## 2014-04-13 NOTE — Progress Notes (Signed)
[name], female   DOB: March 20, 1941, 73 y.o.   MRN: [id number]  I have evaluated her legs.  She has chronic tenderness bilaterally. She also has some eczema from the scd on the right leg.  There is no further cellulitis.  There is no acute swelling. I do not believe there is a DVT.  Plan: Go ahead with discharge home No need for antibiotics. Will hold duplex  Patient is in full agreement and wants to go home.  She will call if she develops any leg swelling or increased pain ASAP

## 2014-04-13 NOTE — Discharge Instructions (Signed)
[facility] ______[facility] °LAPAROSCOPIC SURGERY: POST OP INSTRUCTIONS °Always review your discharge instruction sheet given to you by the facility where your surgery was performed. °IF YOU HAVE DISABILITY OR FAMILY LEAVE FORMS, YOU MUST BRING THEM TO THE OFFICE FOR PROCESSING.   °DO NOT GIVE THEM TO YOUR DOCTOR. ° °1. A prescription for pain medication may be given to you upon discharge.  Take your pain medication as prescribed, if needed.  If narcotic pain medicine is not needed, then you may take acetaminophen (Tylenol) or ibuprofen (Advil) as needed. °2. Take your usually prescribed medications unless otherwise directed. °3. If you need a refill on your pain medication, please contact your pharmacy.  They will contact our office to request authorization. Prescriptions will not be filled after 5pm or on week-ends. °4. You should follow a light diet the first few days after arrival home, such as soup and crackers, etc.  Be sure to include lots of fluids daily. °5. Most patients will experience some swelling and bruising in the area of the incisions.  Ice packs will help.  Swelling and bruising can take several days to resolve.  °6. It is common to experience some constipation if taking pain medication after surgery.  Increasing fluid intake and taking a stool softener (such as Colace) will usually help or prevent this problem from occurring.  A mild laxative (Milk of Magnesia or Miralax) should be taken according to package instructions if there are no bowel movements after 48 hours. °7. Unless discharge instructions indicate otherwise, you may remove your bandages 24-48 hours after surgery, and you may shower at that time.  You may have steri-strips (small skin tapes) in place directly over the incision.  These strips should be left on the skin for 7-10 days.  If your surgeon used skin glue on the incision, you may shower in 24 hours.  The glue will flake off over the next 2-3 weeks.  Any sutures or  staples will be removed at the office during your follow-up visit. °8. ACTIVITIES:  You may resume regular (light) daily activities beginning the next day--such as daily self-care, walking, climbing stairs--gradually increasing activities as tolerated.  You may have sexual intercourse when it is comfortable.  Refrain from any heavy lifting or straining until approved by your doctor. °a. You may drive when you are no longer taking prescription pain medication, you can comfortably wear a seatbelt, and you can safely maneuver your car and apply brakes. °b. RETURN TO WORK:  __________________________________________________________ °9. You should see your doctor in the office for a follow-up appointment approximately 2-3 weeks after your surgery.  Make sure that you call for this appointment within a day or two after you arrive home to insure a convenient appointment time. °10. OTHER INSTRUCTIONS: __________________________________________________________________________________________________________________________ __________________________________________________________________________________________________________________________ °WHEN TO CALL YOUR DOCTOR: °1. Fever over 101.0 °2. Inability to urinate °3. Continued bleeding from incision. °4. Increased pain, redness, or drainage from the incision. °5. Increasing abdominal pain ° °The clinic staff is available to answer your questions during regular business hours.  Please don’t hesitate to call and ask to speak to one of the nurses for clinical concerns.  If you have a medical emergency, go to the nearest emergency room or call 911.  A surgeon from [facility] is always on call at the hospital. °[address], [address], [place], [place]  [address] ? [address] [address], [place], [place]   [address] °[phone] ? [phone] ? FAX [phone] °Web site:   [email or web address] °

## 2014-04-14 ENCOUNTER — Encounter (HOSPITAL_COMMUNITY): Payer: Self-pay | Admitting: Surgery

## 2014-04-20 DIAGNOSIS — R269 Unspecified abnormalities of gait and mobility: Secondary | ICD-10-CM | POA: Diagnosis not present

## 2014-04-21 ENCOUNTER — Other Ambulatory Visit: Payer: Self-pay | Admitting: Endocrinology

## 2014-04-21 DIAGNOSIS — E049 Nontoxic goiter, unspecified: Secondary | ICD-10-CM

## 2014-04-25 DIAGNOSIS — G7249 Other inflammatory and immune myopathies, not elsewhere classified: Secondary | ICD-10-CM | POA: Diagnosis not present

## 2014-04-25 DIAGNOSIS — M159 Polyosteoarthritis, unspecified: Secondary | ICD-10-CM | POA: Diagnosis not present

## 2014-04-25 DIAGNOSIS — E559 Vitamin D deficiency, unspecified: Secondary | ICD-10-CM | POA: Diagnosis not present

## 2014-04-25 DIAGNOSIS — E039 Hypothyroidism, unspecified: Secondary | ICD-10-CM | POA: Diagnosis not present

## 2014-04-25 DIAGNOSIS — Z23 Encounter for immunization: Secondary | ICD-10-CM | POA: Diagnosis not present

## 2014-04-26 DIAGNOSIS — R269 Unspecified abnormalities of gait and mobility: Secondary | ICD-10-CM | POA: Diagnosis not present

## 2014-04-26 DIAGNOSIS — R262 Difficulty in walking, not elsewhere classified: Secondary | ICD-10-CM | POA: Diagnosis not present

## 2014-04-28 DIAGNOSIS — M6281 Muscle weakness (generalized): Secondary | ICD-10-CM | POA: Diagnosis not present

## 2014-05-03 ENCOUNTER — Ambulatory Visit: Payer: Medicare Other | Admitting: Podiatrist

## 2014-05-03 DIAGNOSIS — M6281 Muscle weakness (generalized): Secondary | ICD-10-CM | POA: Diagnosis not present

## 2014-05-05 DIAGNOSIS — M6281 Muscle weakness (generalized): Secondary | ICD-10-CM | POA: Diagnosis not present

## 2014-05-09 ENCOUNTER — Other Ambulatory Visit: Payer: Medicare Other

## 2014-05-10 ENCOUNTER — Ambulatory Visit: Payer: Medicare Other | Admitting: Podiatrist

## 2014-05-10 DIAGNOSIS — M6281 Muscle weakness (generalized): Secondary | ICD-10-CM | POA: Diagnosis not present

## 2014-05-11 DIAGNOSIS — M3390 Dermatopolymyositis, unspecified, organ involvement unspecified: Secondary | ICD-10-CM | POA: Diagnosis not present

## 2014-05-11 DIAGNOSIS — R198 Other specified symptoms and signs involving the digestive system and abdomen: Secondary | ICD-10-CM | POA: Diagnosis not present

## 2014-05-13 DIAGNOSIS — M6281 Muscle weakness (generalized): Secondary | ICD-10-CM | POA: Diagnosis not present

## 2014-05-16 ENCOUNTER — Other Ambulatory Visit: Payer: Medicare Other

## 2014-05-17 ENCOUNTER — Ambulatory Visit (INDEPENDENT_AMBULATORY_CARE_PROVIDER_SITE_OTHER): Payer: Medicare Other | Admitting: Podiatrist

## 2014-05-17 ENCOUNTER — Encounter: Payer: Self-pay | Admitting: Podiatrist

## 2014-05-17 DIAGNOSIS — B351 Tinea unguium: Secondary | ICD-10-CM

## 2014-05-17 DIAGNOSIS — M79676 Pain in unspecified toe(s): Secondary | ICD-10-CM

## 2014-05-17 NOTE — Addendum Note (Signed)
Addended by: Elta Guadeloupe on: 05/17/2014 03:54 PM   Modules accepted: Orders, Medications

## 2014-05-23 DIAGNOSIS — H25813 Combined forms of age-related cataract, bilateral: Secondary | ICD-10-CM | POA: Diagnosis not present

## 2014-05-24 DIAGNOSIS — N3281 Overactive bladder: Secondary | ICD-10-CM | POA: Diagnosis not present

## 2014-05-24 DIAGNOSIS — R350 Frequency of micturition: Secondary | ICD-10-CM | POA: Diagnosis not present

## 2014-05-24 DIAGNOSIS — I10 Essential (primary) hypertension: Secondary | ICD-10-CM | POA: Diagnosis not present

## 2014-05-24 DIAGNOSIS — Z23 Encounter for immunization: Secondary | ICD-10-CM | POA: Diagnosis not present

## 2014-05-24 DIAGNOSIS — E780 Pure hypercholesterolemia: Secondary | ICD-10-CM | POA: Diagnosis not present

## 2014-05-24 DIAGNOSIS — E039 Hypothyroidism, unspecified: Secondary | ICD-10-CM | POA: Diagnosis not present

## 2014-05-24 DIAGNOSIS — N390 Urinary tract infection, site not specified: Secondary | ICD-10-CM | POA: Diagnosis not present

## 2014-05-25 DIAGNOSIS — M6281 Muscle weakness (generalized): Secondary | ICD-10-CM | POA: Diagnosis not present

## 2014-05-27 DIAGNOSIS — M6281 Muscle weakness (generalized): Secondary | ICD-10-CM | POA: Diagnosis not present

## 2014-06-01 DIAGNOSIS — M6281 Muscle weakness (generalized): Secondary | ICD-10-CM | POA: Diagnosis not present

## 2014-06-16 DIAGNOSIS — H25812 Combined forms of age-related cataract, left eye: Secondary | ICD-10-CM | POA: Diagnosis not present

## 2014-06-22 DIAGNOSIS — M6281 Muscle weakness (generalized): Secondary | ICD-10-CM | POA: Diagnosis not present

## 2014-07-01 DIAGNOSIS — R600 Localized edema: Secondary | ICD-10-CM | POA: Diagnosis not present

## 2014-07-01 DIAGNOSIS — M6281 Muscle weakness (generalized): Secondary | ICD-10-CM | POA: Diagnosis not present

## 2014-07-04 DIAGNOSIS — R609 Edema, unspecified: Secondary | ICD-10-CM | POA: Diagnosis not present

## 2014-07-04 DIAGNOSIS — I10 Essential (primary) hypertension: Secondary | ICD-10-CM | POA: Diagnosis not present

## 2014-07-12 DIAGNOSIS — H25811 Combined forms of age-related cataract, right eye: Secondary | ICD-10-CM | POA: Diagnosis not present

## 2014-07-15 DIAGNOSIS — M25562 Pain in left knee: Secondary | ICD-10-CM | POA: Diagnosis not present

## 2014-07-25 DIAGNOSIS — E559 Vitamin D deficiency, unspecified: Secondary | ICD-10-CM | POA: Diagnosis not present

## 2014-07-25 DIAGNOSIS — M3322 Polymyositis with myopathy: Secondary | ICD-10-CM | POA: Diagnosis not present

## 2014-07-25 DIAGNOSIS — M150 Primary generalized (osteo)arthritis: Secondary | ICD-10-CM | POA: Diagnosis not present

## 2014-08-02 DIAGNOSIS — R600 Localized edema: Secondary | ICD-10-CM | POA: Diagnosis not present

## 2014-08-05 DIAGNOSIS — M1712 Unilateral primary osteoarthritis, left knee: Secondary | ICD-10-CM | POA: Diagnosis not present

## 2014-08-09 DIAGNOSIS — E049 Nontoxic goiter, unspecified: Secondary | ICD-10-CM | POA: Diagnosis not present

## 2014-08-09 DIAGNOSIS — I10 Essential (primary) hypertension: Secondary | ICD-10-CM | POA: Diagnosis not present

## 2014-08-09 DIAGNOSIS — E039 Hypothyroidism, unspecified: Secondary | ICD-10-CM | POA: Diagnosis not present

## 2014-08-09 DIAGNOSIS — E559 Vitamin D deficiency, unspecified: Secondary | ICD-10-CM | POA: Diagnosis not present

## 2014-08-15 DIAGNOSIS — M1712 Unilateral primary osteoarthritis, left knee: Secondary | ICD-10-CM | POA: Diagnosis not present

## 2014-08-19 ENCOUNTER — Ambulatory Visit: Payer: Medicare Other

## 2014-08-22 DIAGNOSIS — M1712 Unilateral primary osteoarthritis, left knee: Secondary | ICD-10-CM | POA: Diagnosis not present

## 2014-08-22 DIAGNOSIS — I87323 Chronic venous hypertension (idiopathic) with inflammation of bilateral lower extremity: Secondary | ICD-10-CM | POA: Diagnosis not present

## 2014-08-22 DIAGNOSIS — M25562 Pain in left knee: Secondary | ICD-10-CM | POA: Diagnosis not present

## 2014-08-30 DIAGNOSIS — I87323 Chronic venous hypertension (idiopathic) with inflammation of bilateral lower extremity: Secondary | ICD-10-CM | POA: Diagnosis not present

## 2014-08-30 DIAGNOSIS — M25562 Pain in left knee: Secondary | ICD-10-CM | POA: Diagnosis not present

## 2014-08-30 DIAGNOSIS — M1712 Unilateral primary osteoarthritis, left knee: Secondary | ICD-10-CM | POA: Diagnosis not present

## 2014-09-05 ENCOUNTER — Ambulatory Visit
Admission: RE | Admit: 2014-09-05 | Discharge: 2014-09-05 | Disposition: A | Discharge status: Home / Self Care | Payer: Medicare Other | Source: Ambulatory Visit | Attending: Endocrinology | Admitting: Endocrinology

## 2014-09-05 DIAGNOSIS — E041 Nontoxic single thyroid nodule: Secondary | ICD-10-CM | POA: Diagnosis not present

## 2014-09-05 DIAGNOSIS — E049 Nontoxic goiter, unspecified: Secondary | ICD-10-CM

## 2014-09-09 ENCOUNTER — Ambulatory Visit: Payer: Medicare Other

## 2014-09-23 ENCOUNTER — Ambulatory Visit (INDEPENDENT_AMBULATORY_CARE_PROVIDER_SITE_OTHER): Payer: Medicare Other

## 2014-09-23 VITALS — BP 120/68 | HR 101 | Resp 18

## 2014-09-23 DIAGNOSIS — G629 Polyneuropathy, unspecified: Secondary | ICD-10-CM | POA: Diagnosis not present

## 2014-09-23 DIAGNOSIS — M79676 Pain in unspecified toe(s): Secondary | ICD-10-CM | POA: Diagnosis not present

## 2014-09-23 DIAGNOSIS — G5791 Unspecified mononeuropathy of right lower limb: Secondary | ICD-10-CM

## 2014-09-23 DIAGNOSIS — B351 Tinea unguium: Secondary | ICD-10-CM | POA: Diagnosis not present

## 2014-09-23 DIAGNOSIS — Q828 Other specified congenital malformations of skin: Secondary | ICD-10-CM

## 2014-09-23 DIAGNOSIS — M79673 Pain in unspecified foot: Secondary | ICD-10-CM

## 2014-09-23 NOTE — Progress Notes (Signed)
   Subjective:    [name], female    DOB: 1941/05/12, 74 y.o.   MRN: [id number]  HPI i need my toenails trimmed up and my callus trimmed up    Review of Systems no new findings or systemic changes. Patient has consistent lymphedema both lower extremities is scheduled to get a lymphedema pump in the near future. 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     Objective:   Physical Exam 74 year old female well-developed well-nourished oriented 3 significant lymphedema noted both lower extremities. Presents this time ambulating with assistance of a walker and an AFO brace on her right side. 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 Objectively pedal pulses are palpable DP +2 over 4 bilateral PT 2 over 4 bilateral epicritic sensations on [employer] monofilament are unremarkable however hypersensitivity noted on the right foot and leg as opposed to the left particular debridement of nails and keratoses or on palpation or any movement to the right leg. Significant hyperesthesia is identified as well as loss of her lack of motor function on the right side. Nails criptotic incurvated brittle crumbly painful and tender both on palpation and attempted debridement. There is also seen keratotic lesion sub-fifth metatarsal base lateral plantar right midfoot. There is history possible fracture with nondisplaced or nonhealed fracture fifth metatarsal base with associated underlying keratoses. No open wound or ulcer no secondary infection noted       Assessment & Plan:  Assessment this time is peripheral neuropathy with neuritis neuralgia and cocking factors both sensorimotor neuropathy are identified on the right side. Patient does have thick it'll [place] dystrophic criptotic nails 1 through 5 bilateral debrided at this time  also still keratotic lesion plantar lateral left or right midfoot sub-fifth metatarsal base is debrided maintain accommodative shoes padding and follow-up in 3 months for continued palliative care in the future as needed  Harriet Masson DPM

## 2014-09-26 DIAGNOSIS — M3322 Polymyositis with myopathy: Secondary | ICD-10-CM | POA: Diagnosis not present

## 2014-09-26 DIAGNOSIS — E559 Vitamin D deficiency, unspecified: Secondary | ICD-10-CM | POA: Diagnosis not present

## 2014-09-26 DIAGNOSIS — R350 Frequency of micturition: Secondary | ICD-10-CM | POA: Diagnosis not present

## 2014-09-26 DIAGNOSIS — M150 Primary generalized (osteo)arthritis: Secondary | ICD-10-CM | POA: Diagnosis not present

## 2014-09-26 DIAGNOSIS — E039 Hypothyroidism, unspecified: Secondary | ICD-10-CM | POA: Diagnosis not present

## 2014-09-29 DIAGNOSIS — I87323 Chronic venous hypertension (idiopathic) with inflammation of bilateral lower extremity: Secondary | ICD-10-CM | POA: Diagnosis not present

## 2014-09-29 DIAGNOSIS — M25562 Pain in left knee: Secondary | ICD-10-CM | POA: Diagnosis not present

## 2014-09-29 DIAGNOSIS — M1712 Unilateral primary osteoarthritis, left knee: Secondary | ICD-10-CM | POA: Diagnosis not present

## 2014-10-20 DIAGNOSIS — R1011 Right upper quadrant pain: Secondary | ICD-10-CM | POA: Diagnosis not present

## 2014-10-21 DIAGNOSIS — K760 Fatty (change of) liver, not elsewhere classified: Secondary | ICD-10-CM | POA: Diagnosis not present

## 2014-10-21 DIAGNOSIS — Z9049 Acquired absence of other specified parts of digestive tract: Secondary | ICD-10-CM | POA: Diagnosis not present

## 2014-10-21 DIAGNOSIS — R110 Nausea: Secondary | ICD-10-CM | POA: Diagnosis not present

## 2014-10-21 DIAGNOSIS — R109 Unspecified abdominal pain: Secondary | ICD-10-CM | POA: Diagnosis not present

## 2014-11-01 DIAGNOSIS — I10 Essential (primary) hypertension: Secondary | ICD-10-CM | POA: Diagnosis not present

## 2014-11-01 DIAGNOSIS — R1012 Left upper quadrant pain: Secondary | ICD-10-CM | POA: Diagnosis not present

## 2014-11-01 DIAGNOSIS — R600 Localized edema: Secondary | ICD-10-CM | POA: Diagnosis not present

## 2014-11-01 DIAGNOSIS — E780 Pure hypercholesterolemia: Secondary | ICD-10-CM | POA: Diagnosis not present

## 2014-11-01 DIAGNOSIS — R1013 Epigastric pain: Secondary | ICD-10-CM | POA: Diagnosis not present

## 2014-11-03 DIAGNOSIS — M25562 Pain in left knee: Secondary | ICD-10-CM | POA: Diagnosis not present

## 2014-11-03 DIAGNOSIS — M1712 Unilateral primary osteoarthritis, left knee: Secondary | ICD-10-CM | POA: Diagnosis not present

## 2014-11-03 DIAGNOSIS — I87323 Chronic venous hypertension (idiopathic) with inflammation of bilateral lower extremity: Secondary | ICD-10-CM | POA: Diagnosis not present

## 2014-11-09 DIAGNOSIS — R5383 Other fatigue: Secondary | ICD-10-CM | POA: Diagnosis not present

## 2014-11-09 DIAGNOSIS — M3390 Dermatopolymyositis, unspecified, organ involvement unspecified: Secondary | ICD-10-CM | POA: Diagnosis not present

## 2014-11-17 ENCOUNTER — Encounter: Payer: Self-pay | Admitting: Podiatrist

## 2014-11-17 ENCOUNTER — Ambulatory Visit (INDEPENDENT_AMBULATORY_CARE_PROVIDER_SITE_OTHER): Payer: Medicare Other | Admitting: Podiatrist

## 2014-11-17 ENCOUNTER — Ambulatory Visit: Payer: Medicare Other | Admitting: Podiatrist

## 2014-11-17 VITALS — BP 130/59 | HR 71 | Resp 18

## 2014-11-17 DIAGNOSIS — Q828 Other specified congenital malformations of skin: Secondary | ICD-10-CM | POA: Diagnosis not present

## 2014-11-17 DIAGNOSIS — S92301S Fracture of unspecified metatarsal bone(s), right foot, sequela: Secondary | ICD-10-CM

## 2014-11-17 DIAGNOSIS — G629 Polyneuropathy, unspecified: Secondary | ICD-10-CM | POA: Diagnosis not present

## 2014-11-17 DIAGNOSIS — G5791 Unspecified mononeuropathy of right lower limb: Secondary | ICD-10-CM

## 2014-11-17 NOTE — Progress Notes (Signed)
    HPI: Patient presents today for follow up of foot and callus care. Denies any new complaints today.  Relates she is very sensitive with nerve and muscle disorders and her feet are extremely sensitive.  She has a painful callus submetatarsal base right foot where there is prominent from a previous injury.  She has been swelling and is in need of new shoes and braces but wants to wait until the swelling goes back down.   Objective: Patients chart is reviewed. Vascular status reveals pedal pulses noted at 2 out of 4 dp and pt bilateral . Neurological sensation is hyperesthetic bilateral and decrased to [employer] monofilament bilateral distal digits. Small callus present submet 5 base right.  Intact integument is present post debridement.  Assessment: painful callus right foot  Plan: Discussed treatment options and alternatives. Debrided the callus and dispensed a felt pad for offloading in her shoe. She will be seen at the end of may for nail trim as well

## 2014-12-22 ENCOUNTER — Encounter: Payer: Self-pay | Admitting: Podiatrist

## 2014-12-22 ENCOUNTER — Ambulatory Visit: Payer: Medicare Other | Admitting: Podiatrist

## 2014-12-22 ENCOUNTER — Ambulatory Visit (INDEPENDENT_AMBULATORY_CARE_PROVIDER_SITE_OTHER): Payer: Medicare Other | Admitting: Podiatrist

## 2014-12-22 VITALS — BP 148/79 | HR 94 | Resp 18

## 2014-12-22 DIAGNOSIS — B351 Tinea unguium: Secondary | ICD-10-CM | POA: Diagnosis not present

## 2014-12-22 DIAGNOSIS — G629 Polyneuropathy, unspecified: Secondary | ICD-10-CM

## 2014-12-22 DIAGNOSIS — M79676 Pain in unspecified toe(s): Secondary | ICD-10-CM

## 2014-12-22 NOTE — Progress Notes (Signed)
  JUST A TRIM OF MY TOENAILS  HPI: Patient presents today for follow up of foot and nail care. Denies any new complaints today.  Objective: Patients chart is reviewed. Vascular status reveals pedal pulses noted at 2 out of 4 dp and pt bilateral . Neurological sensation is Normal to [employer] monofilament bilateral. Patients nails are thickened, discolored, distrophic, friable and brittle with yellow-brown discoloration. Patient subjectively relates they are painful with shoes and with ambulation of bilateral feet.  Assessment: Symptomatic onychomycosis  Plan: Discussed treatment options and alternatives. The symptomatic toenails were debrided through manual an mechanical means without complication. Return appointment recommended at routine intervals of 3 months

## 2014-12-27 NOTE — Addendum Note (Signed)
Addended by: Cranford Mon R on: 12/27/2014 11:12 AM   Modules accepted: Medications

## 2014-12-30 ENCOUNTER — Other Ambulatory Visit (HOSPITAL_COMMUNITY): Payer: Self-pay | Admitting: Orthopedic Surgery

## 2015-01-05 DIAGNOSIS — R1310 Dysphagia, unspecified: Secondary | ICD-10-CM | POA: Diagnosis not present

## 2015-01-05 DIAGNOSIS — R1013 Epigastric pain: Secondary | ICD-10-CM | POA: Diagnosis not present

## 2015-01-05 DIAGNOSIS — R1012 Left upper quadrant pain: Secondary | ICD-10-CM | POA: Diagnosis not present

## 2015-01-07 NOTE — Pre-Procedure Instructions (Signed)
Janice [name]  01/07/2015       Your procedure is scheduled on June 22  Report to [facility] Admitting at 6:30 A.M.  Call this number if you have problems the morning of surgery:  [phone]   Remember:  Do not eat food or drink liquids after midnight.  Take these medicines the morning of surgery with A SIP OF WATER Albuterol, Toviaz, Flonase, Hydrocodone (if needed), Synthroid   STOP Naproxen, Magnesium, Folic Acid, Vitamin D, Calcium, Biotin, Aspirin, Alpha- Lipoic Acid on June 15   STOP/ Do not take Aspirin, Aleve, Naproxen, Advil, Ibuprofen, Motrin, Vitamins, Herbs, or Supplements starting June 15   Do not wear jewelry, make-up or nail polish.  Do not wear lotions, powders, or perfumes.  You may wear deodorant.  Do not shave 48 hours prior to surgery.  Men may shave face and neck.  Do not bring valuables to the hospital.  [facility] is not responsible for any belongings or valuables.  Contacts, dentures or bridgework may not be worn into surgery.  Leave your suitcase in the car.  After surgery it may be brought to your room.  For patients admitted to the hospital, discharge time will be determined by your treatment team.  Patients discharged the day of surgery will not be allowed to drive home.   [facility] - Preparing for Surgery  Before surgery, you can play an important role.  Because skin is not sterile, your skin needs to be as free of germs as possible.  You can reduce the number of germs on you skin by washing with CHG (chlorahexidine gluconate) soap before surgery.  CHG is an antiseptic cleaner which kills germs and bonds with the skin to continue killing germs even after washing.  Please DO NOT use if you have an allergy to CHG or antibacterial soaps.  If your skin becomes reddened/irritated stop using the CHG and inform your nurse when you arrive at Short Stay.  Do not shave (including legs and underarms) for at least 48 hours prior to the first  CHG shower.  You may shave your face.  Please follow these instructions carefully:   1.  Shower with CHG Soap the night before surgery and the morning of Surgery.  2.  If you choose to wash your hair, wash your hair first as usual with your normal shampoo.  3.  After you shampoo, rinse your hair and body thoroughly to remove the shampoo.  4.  Use CHG as you would any other liquid soap.  You can apply CHG directly to the skin and wash gently with scrungie or a clean washcloth.  5.  Apply the CHG Soap to your body ONLY FROM THE NECK DOWN.  Do not use on open wounds or open sores.  Avoid contact with your eyes, ears, mouth and genitals (private parts).  Wash genitals (private parts) with your normal soap.  6.  Wash thoroughly, paying special attention to the area where your surgery will be performed.  7.  Thoroughly rinse your body with warm water from the neck down.  8.  DO NOT shower/wash with your normal soap after using and rinsing off the CHG Soap.  9.  Pat yourself dry with a clean towel.            10.  Wear clean pajamas.            11.  Place clean sheets on your bed the night of your  first shower and do not sleep with pets.  Day of Surgery  Do not apply any lotions the morning of surgery.  Please wear clean clothes to the hospital/surgery center.    Please read over the following fact sheets that you were given. Pain Booklet, Coughing and Deep Breathing, Blood Transfusion Information, Total Joint Packet and Surgical Site Infection Prevention

## 2015-01-09 ENCOUNTER — Encounter (HOSPITAL_COMMUNITY): Payer: Self-pay

## 2015-01-09 ENCOUNTER — Encounter (HOSPITAL_COMMUNITY)
Admission: RE | Admit: 2015-01-09 | Discharge: 2015-01-09 | Disposition: A | Discharge status: Home / Self Care | Payer: Medicare Other | Source: Ambulatory Visit | Attending: Orthopedic Surgery | Admitting: Orthopedic Surgery

## 2015-01-09 DIAGNOSIS — Z01818 Encounter for other preprocedural examination: Secondary | ICD-10-CM | POA: Insufficient documentation

## 2015-01-09 HISTORY — DX: Headache, unspecified: R51.9

## 2015-01-09 HISTORY — DX: Anemia, unspecified: D64.9

## 2015-01-09 HISTORY — DX: Gastro-esophageal reflux disease without esophagitis: K21.9

## 2015-01-09 HISTORY — DX: Headache: R51

## 2015-01-09 HISTORY — DX: Dermatopolymyositis, unspecified, organ involvement unspecified: M33.90

## 2015-01-09 HISTORY — DX: Unspecified osteoarthritis, unspecified site: M19.90

## 2015-01-09 HISTORY — DX: Fibromyalgia: M79.7

## 2015-01-09 HISTORY — DX: Hypothyroidism, unspecified: E03.9

## 2015-01-09 HISTORY — DX: Other dermatomyositis without myopathy: M33.13

## 2015-01-09 LAB — COMPREHENSIVE METABOLIC PANEL
ALBUMIN: 3.8 g/dL (ref 3.5–5.0)
ALK PHOS: 78 U/L (ref 38–126)
ALT: 16 U/L (ref 14–54)
AST: 17 U/L (ref 15–41)
Anion gap: 6 (ref 5–15)
BUN: 18 mg/dL (ref 6–20)
CALCIUM: 9.6 mg/dL (ref 8.9–10.3)
CO2: 27 mmol/L (ref 22–32)
Chloride: 106 mmol/L (ref 101–111)
Creatinine, Ser: 0.54 mg/dL (ref 0.44–1.00)
GFR calc non Af Amer: >60 mL/min (ref >60)
Glucose, Bld: 89 mg/dL (ref 65–99)
POTASSIUM: 4.3 mmol/L (ref 3.5–5.1)
SODIUM: 139 mmol/L (ref 135–145)
TOTAL PROTEIN: 6.9 g/dL (ref 6.5–8.1)
Total Bilirubin: 0.5 mg/dL (ref 0.3–1.2)

## 2015-01-09 LAB — CBC
HEMATOCRIT: 34.9 % — AB (ref 36.0–46.0)
Hemoglobin: 12 g/dL (ref 12.0–15.0)
MCH: 29.6 pg (ref 26.0–34.0)
MCHC: 34.4 g/dL (ref 30.0–36.0)
MCV: 86.2 fL (ref 78.0–100.0)
PLATELETS: 258 10*3/uL (ref 150–400)
RBC: 4.05 MIL/uL (ref 3.87–5.11)
RDW: 12.8 % (ref 11.5–15.5)
WBC: 5 10*3/uL (ref 4.0–10.5)

## 2015-01-09 LAB — PROTIME-INR
INR: 1.13 (ref 0.00–1.49)
Prothrombin Time: 14.7 seconds (ref 11.6–15.2)

## 2015-01-09 LAB — SURGICAL PCR SCREEN
MRSA, PCR: NEGATIVE
Staphylococcus aureus: NEGATIVE

## 2015-01-09 LAB — APTT: aPTT: 38 seconds — ABNORMAL HIGH (ref 24–37)

## 2015-01-09 NOTE — Progress Notes (Signed)
Pt. Reports the daily magnesium (total 1500mg . ) is absolutely necessary due to muscular problem/ disorder.  Pt. Has had echo in 2009, no cardiac complaints since then. Followed by C. Tamala Julian, PCP. Sleep study done with Dr. Maxwell Caul, setting on CPAP at 10.5 , in use every night. Inhalers in use but pt. Reports that her resp. Issues are stable.

## 2015-01-10 NOTE — Progress Notes (Addendum)
Anesthesia Chart Review:  Pt is 74 year old female scheduled for L total knee arthroplasty on 01/18/2015 with Dr. Sharol Given.   PMH includes: HTN, R carotid stenosis, asthma, OSA, anemia, hypothyroidism, heart murmur, GERD, trigeminal neuralgia. Never smoker. BMI 46.5. S/p laparoscopic cholecystectomy 04/12/14.   Medications include: ASA, benicar, methotrexate, synthroid  Preoperative labs reviewed.  PTT 38.   Chest x-ray 04/12/2014 reviewed. No edema or consolidation. Aorta is somewhat tortuous. This finding may be indicative of chronic hypertension  EKG 04/08/2014: NSR. LAFB.   Carotid duplex US 01/12/2008: - Mid R ICA 40-59% stenosis - No significant L ICA stenosis  Reviewed case with Dr. Deatra Canter.   If no changes, I anticipate pt can proceed with surgery as scheduled.   Willeen Cass, FNP-BC [facility]/Anesthesiology Phone: [phone] 01/11/2015 2:54 PM

## 2015-02-01 ENCOUNTER — Inpatient Hospital Stay (HOSPITAL_COMMUNITY): Payer: Medicare Other | Admitting: Emergency Medicine

## 2015-02-01 ENCOUNTER — Encounter (HOSPITAL_COMMUNITY)
Admission: RE | Disposition: A | Discharge status: Skilled Nursing Facility | Payer: Self-pay | Source: Ambulatory Visit | Attending: Orthopedic Surgery

## 2015-02-01 ENCOUNTER — Inpatient Hospital Stay (HOSPITAL_COMMUNITY): Payer: Medicare Other | Admitting: Anesthesiology

## 2015-02-01 ENCOUNTER — Encounter (HOSPITAL_COMMUNITY): Payer: Self-pay | Admitting: Surgery

## 2015-02-01 ENCOUNTER — Inpatient Hospital Stay (HOSPITAL_COMMUNITY)
Admission: RE | Admit: 2015-02-01 | Discharge: 2015-02-04 | DRG: 470 | Disposition: A | Discharge status: Skilled Nursing Facility | Payer: Medicare Other | Source: Ambulatory Visit | Attending: Orthopedic Surgery | Admitting: Orthopedic Surgery

## 2015-02-01 DIAGNOSIS — Z96659 Presence of unspecified artificial knee joint: Secondary | ICD-10-CM

## 2015-02-01 DIAGNOSIS — K219 Gastro-esophageal reflux disease without esophagitis: Secondary | ICD-10-CM | POA: Diagnosis present

## 2015-02-01 DIAGNOSIS — Z96651 Presence of right artificial knee joint: Secondary | ICD-10-CM | POA: Diagnosis present

## 2015-02-01 DIAGNOSIS — M25562 Pain in left knee: Secondary | ICD-10-CM | POA: Diagnosis not present

## 2015-02-01 DIAGNOSIS — Z96652 Presence of left artificial knee joint: Secondary | ICD-10-CM

## 2015-02-01 DIAGNOSIS — M179 Osteoarthritis of knee, unspecified: Principal | ICD-10-CM | POA: Diagnosis present

## 2015-02-01 DIAGNOSIS — Z96641 Presence of right artificial hip joint: Secondary | ICD-10-CM | POA: Diagnosis present

## 2015-02-01 DIAGNOSIS — I10 Essential (primary) hypertension: Secondary | ICD-10-CM | POA: Diagnosis not present

## 2015-02-01 DIAGNOSIS — E662 Morbid (severe) obesity with alveolar hypoventilation: Secondary | ICD-10-CM | POA: Diagnosis not present

## 2015-02-01 DIAGNOSIS — Z7982 Long term (current) use of aspirin: Secondary | ICD-10-CM | POA: Diagnosis not present

## 2015-02-01 DIAGNOSIS — M1712 Unilateral primary osteoarthritis, left knee: Secondary | ICD-10-CM | POA: Diagnosis not present

## 2015-02-01 DIAGNOSIS — I739 Peripheral vascular disease, unspecified: Secondary | ICD-10-CM | POA: Diagnosis present

## 2015-02-01 DIAGNOSIS — Z6841 Body Mass Index (BMI) 40.0 and over, adult: Secondary | ICD-10-CM | POA: Diagnosis not present

## 2015-02-01 DIAGNOSIS — E039 Hypothyroidism, unspecified: Secondary | ICD-10-CM | POA: Diagnosis present

## 2015-02-01 DIAGNOSIS — Z4789 Encounter for other orthopedic aftercare: Secondary | ICD-10-CM | POA: Diagnosis not present

## 2015-02-01 DIAGNOSIS — M797 Fibromyalgia: Secondary | ICD-10-CM | POA: Diagnosis not present

## 2015-02-01 HISTORY — PX: TOTAL KNEE ARTHROPLASTY: SHX125

## 2015-02-01 LAB — BASIC METABOLIC PANEL
Anion gap: 11 (ref 5–15)
BUN: 21 mg/dL — AB (ref 6–20)
CO2: 25 mmol/L (ref 22–32)
CREATININE: 0.67 mg/dL (ref 0.44–1.00)
Calcium: 9.5 mg/dL (ref 8.9–10.3)
Chloride: 102 mmol/L (ref 101–111)
Glucose, Bld: 94 mg/dL (ref 65–99)
POTASSIUM: 4 mmol/L (ref 3.5–5.1)
SODIUM: 138 mmol/L (ref 135–145)

## 2015-02-01 LAB — CBC
HCT: 36.8 % (ref 36.0–46.0)
Hemoglobin: 12.5 g/dL (ref 12.0–15.0)
MCH: 29.6 pg (ref 26.0–34.0)
MCHC: 34 g/dL (ref 30.0–36.0)
MCV: 87 fL (ref 78.0–100.0)
Platelets: 304 10*3/uL (ref 150–400)
RBC: 4.23 MIL/uL (ref 3.87–5.11)
RDW: 13.3 % (ref 11.5–15.5)
WBC: 8 10*3/uL (ref 4.0–10.5)

## 2015-02-01 LAB — PROTIME-INR
INR: 1.11 (ref 0.00–1.49)
PROTHROMBIN TIME: 14.5 s (ref 11.6–15.2)

## 2015-02-01 LAB — APTT: APTT: 37 s (ref 24–37)

## 2015-02-01 SURGERY — ARTHROPLASTY, KNEE, TOTAL
Anesthesia: General | Laterality: Left

## 2015-02-01 MED ORDER — HYDROMORPHONE HCL 1 MG/ML IJ SOLN
1.0000 mg | INTRAMUSCULAR | Status: DC | PRN
  Administered 2015-02-01 – 2015-02-04 (×5): 1 mg via INTRAVENOUS
  Filled 2015-02-01 (×5): qty 1

## 2015-02-01 MED ORDER — KETOROLAC TROMETHAMINE 30 MG/ML IJ SOLN
INTRAMUSCULAR | Status: AC
  Filled 2015-02-01: qty 1

## 2015-02-01 MED ORDER — FESOTERODINE FUMARATE ER 8 MG PO TB24
8.0000 mg | ORAL_TABLET | Freq: Every day | ORAL | Status: DC
  Administered 2015-02-02 – 2015-02-04 (×3): 8 mg via ORAL
  Filled 2015-02-01 (×3): qty 1

## 2015-02-01 MED ORDER — DOCUSATE SODIUM 100 MG PO CAPS
100.0000 mg | ORAL_CAPSULE | Freq: Two times a day (BID) | ORAL | Status: DC
  Administered 2015-02-02 – 2015-02-03 (×2): 100 mg via ORAL
  Filled 2015-02-01 (×5): qty 1

## 2015-02-01 MED ORDER — ALBUTEROL SULFATE (2.5 MG/3ML) 0.083% IN NEBU: 2.5000 mg | INHALATION_SOLUTION | RESPIRATORY_TRACT | Status: DC | PRN

## 2015-02-01 MED ORDER — KETAMINE HCL 100 MG/ML IJ SOLN
INTRAMUSCULAR | Status: DC | PRN
  Administered 2015-02-01: 60 mg via INTRAVENOUS

## 2015-02-01 MED ORDER — SODIUM CHLORIDE 0.9 % IR SOLN
Status: DC | PRN
  Administered 2015-02-01: 2000 mL

## 2015-02-01 MED ORDER — TRANEXAMIC ACID 1000 MG/10ML IV SOLN
2000.0000 mg | INTRAVENOUS | Status: AC
  Administered 2015-02-01: 2000 mg via TOPICAL
  Filled 2015-02-01: qty 20

## 2015-02-01 MED ORDER — OXYCODONE HCL 5 MG PO TABS
5.0000 mg | ORAL_TABLET | ORAL | Status: DC | PRN
  Administered 2015-02-01 – 2015-02-03 (×11): 10 mg via ORAL
  Filled 2015-02-01 (×11): qty 2

## 2015-02-01 MED ORDER — OLMESARTAN MEDOXOMIL 40 MG PO TABS
40.0000 mg | ORAL_TABLET | Freq: Every day | ORAL | Status: DC
  Administered 2015-02-02 – 2015-02-04 (×3): 40 mg via ORAL
  Filled 2015-02-01 (×3): qty 1

## 2015-02-01 MED ORDER — CLINDAMYCIN PHOSPHATE 600 MG/50ML IV SOLN
600.0000 mg | Freq: Four times a day (QID) | INTRAVENOUS | Status: AC
  Administered 2015-02-01 (×2): 600 mg via INTRAVENOUS
  Filled 2015-02-01 (×2): qty 50

## 2015-02-01 MED ORDER — LACTATED RINGERS IV SOLN
INTRAVENOUS | Status: DC | PRN
  Administered 2015-02-01 (×2): via INTRAVENOUS

## 2015-02-01 MED ORDER — POLYETHYLENE GLYCOL 3350 17 G PO PACK
17.0000 g | PACK | Freq: Every day | ORAL | Status: DC | PRN
Start: 2015-02-01 — End: 2015-02-04
  Filled 2015-02-01 (×2): qty 1

## 2015-02-01 MED ORDER — FLUTICASONE PROPIONATE 50 MCG/ACT NA SUSP: 1.0000 | Freq: Every day | NASAL | Status: DC | PRN

## 2015-02-01 MED ORDER — CHLORHEXIDINE GLUCONATE 4 % EX LIQD: 60.0000 mL | Freq: Once | CUTANEOUS | Status: DC

## 2015-02-01 MED ORDER — HYDROMORPHONE HCL 1 MG/ML IJ SOLN
INTRAMUSCULAR | Status: AC
  Filled 2015-02-01: qty 1

## 2015-02-01 MED ORDER — MAGNESIUM CITRATE PO SOLN: 1.0000 | Freq: Once | ORAL | Status: AC | PRN

## 2015-02-01 MED ORDER — METOCLOPRAMIDE HCL 5 MG/ML IJ SOLN: 5.0000 mg | Freq: Three times a day (TID) | INTRAMUSCULAR | Status: DC | PRN

## 2015-02-01 MED ORDER — METHOCARBAMOL 500 MG PO TABS
500.0000 mg | ORAL_TABLET | Freq: Four times a day (QID) | ORAL | Status: DC | PRN
  Administered 2015-02-01 – 2015-02-04 (×8): 500 mg via ORAL
  Filled 2015-02-01 (×8): qty 1

## 2015-02-01 MED ORDER — 0.9 % SODIUM CHLORIDE (POUR BTL) OPTIME
TOPICAL | Status: DC | PRN
  Administered 2015-02-01: 1000 mL

## 2015-02-01 MED ORDER — OXYCODONE HCL 5 MG PO TABS
5.0000 mg | ORAL_TABLET | Freq: Once | ORAL | Status: AC | PRN
  Administered 2015-02-01: 5 mg via ORAL

## 2015-02-01 MED ORDER — FENTANYL CITRATE (PF) 250 MCG/5ML IJ SOLN
INTRAMUSCULAR | Status: AC
  Filled 2015-02-01: qty 5

## 2015-02-01 MED ORDER — PROMETHAZINE HCL 25 MG/ML IJ SOLN: 6.2500 mg | INTRAMUSCULAR | Status: DC | PRN

## 2015-02-01 MED ORDER — PHENOL 1.4 % MT LIQD: 1.0000 | OROMUCOSAL | Status: DC | PRN

## 2015-02-01 MED ORDER — IRBESARTAN 75 MG PO TABS: 37.5000 mg | ORAL_TABLET | Freq: Every day | ORAL | Status: DC

## 2015-02-01 MED ORDER — LEVOTHYROXINE SODIUM 112 MCG PO TABS
112.0000 ug | ORAL_TABLET | Freq: Every day | ORAL | Status: DC
  Administered 2015-02-02 – 2015-02-04 (×3): 112 ug via ORAL
  Filled 2015-02-01 (×3): qty 1

## 2015-02-01 MED ORDER — KETOROLAC TROMETHAMINE 30 MG/ML IJ SOLN
30.0000 mg | Freq: Once | INTRAMUSCULAR | Status: AC | PRN
  Administered 2015-02-01: 30 mg via INTRAVENOUS

## 2015-02-01 MED ORDER — BUPIVACAINE LIPOSOME 1.3 % IJ SUSP
INTRAMUSCULAR | Status: DC | PRN
  Administered 2015-02-01: 20 mL

## 2015-02-01 MED ORDER — ONDANSETRON HCL 4 MG/2ML IJ SOLN: 4.0000 mg | Freq: Four times a day (QID) | INTRAMUSCULAR | Status: DC | PRN

## 2015-02-01 MED ORDER — OXYCODONE HCL 5 MG PO TABS
ORAL_TABLET | ORAL | Status: AC
  Filled 2015-02-01: qty 1

## 2015-02-01 MED ORDER — BISACODYL 5 MG PO TBEC
5.0000 mg | DELAYED_RELEASE_TABLET | Freq: Every day | ORAL | Status: DC | PRN
  Filled 2015-02-01: qty 1

## 2015-02-01 MED ORDER — ASPIRIN EC 325 MG PO TBEC
325.0000 mg | DELAYED_RELEASE_TABLET | Freq: Every day | ORAL | Status: DC
  Administered 2015-02-02 – 2015-02-04 (×3): 325 mg via ORAL
  Filled 2015-02-01 (×3): qty 1

## 2015-02-01 MED ORDER — ONDANSETRON HCL 4 MG/2ML IJ SOLN
INTRAMUSCULAR | Status: AC
  Filled 2015-02-01: qty 2

## 2015-02-01 MED ORDER — METHOCARBAMOL 1000 MG/10ML IJ SOLN
500.0000 mg | Freq: Four times a day (QID) | INTRAMUSCULAR | Status: DC | PRN
  Administered 2015-02-01: 500 mg via INTRAVENOUS
  Filled 2015-02-01 (×3): qty 5

## 2015-02-01 MED ORDER — OXYCODONE HCL 5 MG/5ML PO SOLN: 5.0000 mg | Freq: Once | ORAL | Status: AC | PRN

## 2015-02-01 MED ORDER — HYDROMORPHONE HCL 1 MG/ML IJ SOLN
0.2500 mg | INTRAMUSCULAR | Status: DC | PRN
  Administered 2015-02-01 (×4): 0.5 mg via INTRAVENOUS

## 2015-02-01 MED ORDER — FOLIC ACID 1 MG PO TABS
1.0000 mg | ORAL_TABLET | Freq: Every day | ORAL | Status: DC
  Administered 2015-02-02 – 2015-02-04 (×3): 1 mg via ORAL
  Filled 2015-02-01 (×4): qty 1

## 2015-02-01 MED ORDER — HYDROMORPHONE HCL 1 MG/ML IJ SOLN
INTRAMUSCULAR | Status: AC
  Administered 2015-02-01: 1 mg via INTRAVENOUS
  Filled 2015-02-01: qty 2

## 2015-02-01 MED ORDER — VITAMIN D 1000 UNITS PO TABS
2000.0000 [IU] | ORAL_TABLET | Freq: Two times a day (BID) | ORAL | Status: DC
  Administered 2015-02-01 – 2015-02-04 (×6): 2000 [IU] via ORAL
  Filled 2015-02-01 (×6): qty 2

## 2015-02-01 MED ORDER — CLINDAMYCIN PHOSPHATE 900 MG/50ML IV SOLN
900.0000 mg | INTRAVENOUS | Status: AC
  Administered 2015-02-01: 900 mg via INTRAVENOUS

## 2015-02-01 MED ORDER — FENTANYL CITRATE (PF) 100 MCG/2ML IJ SOLN
INTRAMUSCULAR | Status: DC | PRN
  Administered 2015-02-01: 100 ug via INTRAVENOUS
  Administered 2015-02-01: 50 ug via INTRAVENOUS

## 2015-02-01 MED ORDER — ONDANSETRON HCL 4 MG PO TABS
4.0000 mg | ORAL_TABLET | Freq: Four times a day (QID) | ORAL | Status: DC | PRN
  Filled 2015-02-01: qty 1

## 2015-02-01 MED ORDER — PROPOFOL 10 MG/ML IV BOLUS
INTRAVENOUS | Status: DC | PRN
  Administered 2015-02-01: 160 mg via INTRAVENOUS

## 2015-02-01 MED ORDER — ACETAMINOPHEN 325 MG PO TABS: 650.0000 mg | ORAL_TABLET | Freq: Four times a day (QID) | ORAL | Status: DC | PRN

## 2015-02-01 MED ORDER — PROPOFOL 10 MG/ML IV BOLUS
INTRAVENOUS | Status: AC
  Filled 2015-02-01: qty 20

## 2015-02-01 MED ORDER — MONTELUKAST SODIUM 10 MG PO TABS
10.0000 mg | ORAL_TABLET | Freq: Every day | ORAL | Status: DC
  Administered 2015-02-02 – 2015-02-04 (×3): 10 mg via ORAL
  Filled 2015-02-01 (×4): qty 1

## 2015-02-01 MED ORDER — MENTHOL 3 MG MT LOZG: 1.0000 | LOZENGE | OROMUCOSAL | Status: DC | PRN

## 2015-02-01 MED ORDER — SUCCINYLCHOLINE CHLORIDE 20 MG/ML IJ SOLN
INTRAMUSCULAR | Status: AC
  Filled 2015-02-01: qty 1

## 2015-02-01 MED ORDER — ONDANSETRON HCL 4 MG/2ML IJ SOLN
INTRAMUSCULAR | Status: DC | PRN
  Administered 2015-02-01: 4 mg via INTRAVENOUS

## 2015-02-01 MED ORDER — ACETAMINOPHEN 650 MG RE SUPP: 650.0000 mg | Freq: Four times a day (QID) | RECTAL | Status: DC | PRN

## 2015-02-01 MED ORDER — CLINDAMYCIN PHOSPHATE 900 MG/50ML IV SOLN
INTRAVENOUS | Status: AC
  Filled 2015-02-01: qty 50

## 2015-02-01 MED ORDER — LIDOCAINE HCL (CARDIAC) 20 MG/ML IV SOLN
INTRAVENOUS | Status: DC | PRN
  Administered 2015-02-01: 60 mg via INTRAVENOUS

## 2015-02-01 MED ORDER — PHENYLEPHRINE 40 MCG/ML (10ML) SYRINGE FOR IV PUSH (FOR BLOOD PRESSURE SUPPORT)
PREFILLED_SYRINGE | INTRAVENOUS | Status: AC
  Filled 2015-02-01: qty 10

## 2015-02-01 MED ORDER — VITAMIN D 50 MCG (2000 UT) PO CAPS: 2000.0000 [IU] | ORAL_CAPSULE | Freq: Two times a day (BID) | ORAL | Status: DC

## 2015-02-01 MED ORDER — LIDOCAINE HCL (CARDIAC) 20 MG/ML IV SOLN
INTRAVENOUS | Status: AC
  Filled 2015-02-01: qty 5

## 2015-02-01 MED ORDER — SUCCINYLCHOLINE CHLORIDE 20 MG/ML IJ SOLN
INTRAMUSCULAR | Status: DC | PRN
  Administered 2015-02-01: 120 mg via INTRAVENOUS

## 2015-02-01 MED ORDER — KETAMINE HCL 100 MG/ML IJ SOLN
INTRAMUSCULAR | Status: AC
  Filled 2015-02-01: qty 1

## 2015-02-01 MED ORDER — ALBUTEROL SULFATE HFA 108 (90 BASE) MCG/ACT IN AERS: 2.0000 | INHALATION_SPRAY | RESPIRATORY_TRACT | Status: DC | PRN

## 2015-02-01 MED ORDER — BUPIVACAINE LIPOSOME 1.3 % IJ SUSP
20.0000 mL | INTRAMUSCULAR | Status: DC
  Filled 2015-02-01: qty 20

## 2015-02-01 MED ORDER — PHENYLEPHRINE HCL 10 MG/ML IJ SOLN
INTRAMUSCULAR | Status: DC | PRN
  Administered 2015-02-01: 80 ug via INTRAVENOUS

## 2015-02-01 MED ORDER — METOCLOPRAMIDE HCL 5 MG PO TABS: 5.0000 mg | ORAL_TABLET | Freq: Three times a day (TID) | ORAL | Status: DC | PRN

## 2015-02-01 SURGICAL SUPPLY — 62 items
BAG DECANTER FOR FLEXI CONT (MISCELLANEOUS) ×1 IMPLANT
BLADE SAG 18X100X1.27 (BLADE) ×2 IMPLANT
BLADE SAGITTAL 25.0X1.27X90 (BLADE) ×2 IMPLANT
BLADE SURG 21 STRL SS (BLADE) ×4 IMPLANT
BNDG COHESIVE 6X5 TAN STRL LF (GAUZE/BANDAGES/DRESSINGS) ×3 IMPLANT
BNDG GAUZE ELAST 4 BULKY (GAUZE/BANDAGES/DRESSINGS) ×1 IMPLANT
BONE CEMENT PALACOSE (Orthopedic Implant) ×4 IMPLANT
BOWL SMART MIX CTS (DISPOSABLE) ×2 IMPLANT
CAPT KNEE TOTAL 3 ATTUNE ×1 IMPLANT
CEMENT BONE PALACOSE (Orthopedic Implant) ×2 IMPLANT
COVER SURGICAL LIGHT HANDLE (MISCELLANEOUS) ×4 IMPLANT
CUFF TOURNIQUET SINGLE 34IN LL (TOURNIQUET CUFF) IMPLANT
CUFF TOURNIQUET SINGLE 44IN (TOURNIQUET CUFF) IMPLANT
DRAPE EXTREMITY T 121X128X90 (DRAPE) ×2 IMPLANT
DRAPE IMP U-DRAPE 54X76 (DRAPES) ×2 IMPLANT
DRAPE PROXIMA HALF (DRAPES) ×2 IMPLANT
DRAPE U-SHAPE 47X51 STRL (DRAPES) ×2 IMPLANT
DRSG ADAPTIC 3X8 NADH LF (GAUZE/BANDAGES/DRESSINGS) ×3 IMPLANT
DRSG PAD ABDOMINAL 8X10 ST (GAUZE/BANDAGES/DRESSINGS) ×2 IMPLANT
DURAPREP 26ML APPLICATOR (WOUND CARE) ×2 IMPLANT
ELECT REM PT RETURN 9FT ADLT (ELECTROSURGICAL) ×2
ELECTRODE REM PT RTRN 9FT ADLT (ELECTROSURGICAL) ×1 IMPLANT
FACESHIELD WRAPAROUND (MASK) ×2 IMPLANT
FACESHIELD WRAPAROUND OR TEAM (MASK) ×1 IMPLANT
GAUZE SPONGE 4X4 12PLY STRL (GAUZE/BANDAGES/DRESSINGS) ×2 IMPLANT
GLOVE BIOGEL PI IND STRL 6.5 (GLOVE) IMPLANT
GLOVE BIOGEL PI IND STRL 7.5 (GLOVE) IMPLANT
GLOVE BIOGEL PI IND STRL 9 (GLOVE) ×1 IMPLANT
GLOVE BIOGEL PI INDICATOR 6.5 (GLOVE) ×3
GLOVE BIOGEL PI INDICATOR 7.5 (GLOVE) ×2
GLOVE BIOGEL PI INDICATOR 9 (GLOVE) ×1
GLOVE SURG ORTHO 9.0 STRL STRW (GLOVE) ×2 IMPLANT
GLOVE SURG SS PI 6.5 STRL IVOR (GLOVE) ×3 IMPLANT
GOWN STRL REUS W/ TWL LRG LVL3 (GOWN DISPOSABLE) IMPLANT
GOWN STRL REUS W/ TWL XL LVL3 (GOWN DISPOSABLE) ×3 IMPLANT
GOWN STRL REUS W/TWL LRG LVL3 (GOWN DISPOSABLE) ×6
GOWN STRL REUS W/TWL XL LVL3 (GOWN DISPOSABLE) ×4
HANDPIECE INTERPULSE COAX TIP (DISPOSABLE) ×2
KIT BASIN OR (CUSTOM PROCEDURE TRAY) ×2 IMPLANT
KIT ROOM TURNOVER OR (KITS) ×2 IMPLANT
MANIFOLD NEPTUNE II (INSTRUMENTS) ×2 IMPLANT
NDL SPNL 18GX3.5 QUINCKE PK (NEEDLE) ×1 IMPLANT
NEEDLE SPNL 18GX3.5 QUINCKE PK (NEEDLE) ×2 IMPLANT
NS IRRIG 1000ML POUR BTL (IV SOLUTION) ×2 IMPLANT
PACK TOTAL JOINT (CUSTOM PROCEDURE TRAY) ×2 IMPLANT
PACK UNIVERSAL I (CUSTOM PROCEDURE TRAY) ×2 IMPLANT
PAD ARMBOARD 7.5X6 YLW CONV (MISCELLANEOUS) ×4 IMPLANT
PADDING CAST COTTON 6X4 STRL (CAST SUPPLIES) ×2 IMPLANT
SET HNDPC FAN SPRY TIP SCT (DISPOSABLE) ×1 IMPLANT
SPONGE GAUZE 4X4 12PLY STER LF (GAUZE/BANDAGES/DRESSINGS) ×1 IMPLANT
STAPLER VISISTAT 35W (STAPLE) ×2 IMPLANT
SUCTION FRAZIER TIP 10 FR DISP (SUCTIONS) IMPLANT
SUT VIC AB 0 CTB1 27 (SUTURE) IMPLANT
SUT VIC AB 1 CTX 27 (SUTURE) ×1 IMPLANT
SUT VIC AB 1 CTX 36 (SUTURE)
SUT VIC AB 1 CTX36XBRD ANBCTR (SUTURE) IMPLANT
SYR 50ML LL SCALE MARK (SYRINGE) ×2 IMPLANT
TOWEL OR 17X24 6PK STRL BLUE (TOWEL DISPOSABLE) ×2 IMPLANT
TOWEL OR 17X26 10 PK STRL BLUE (TOWEL DISPOSABLE) ×1 IMPLANT
TRAY FOLEY CATH 16FRSI W/METER (SET/KITS/TRAYS/PACK) IMPLANT
WATER STERILE IRR 1000ML POUR (IV SOLUTION) ×4 IMPLANT
WRAP KNEE MAXI GEL POST OP (GAUZE/BANDAGES/DRESSINGS) ×2 IMPLANT

## 2015-02-01 NOTE — Transfer of Care (Signed)
Immediate Anesthesia Transfer of Care Note  Patient: Janice [name]  Procedure(s) Performed: Procedure(s): LEFT TOTAL KNEE ARTHROPLASTY (Left)  Patient Location: PACU  Anesthesia Type:General  Level of Consciousness: awake, alert  and oriented  Airway & Oxygen Therapy: Patient Spontanous Breathing and Patient connected to nasal cannula oxygen  Post-op Assessment: Report given to RN and Post -op Vital signs reviewed and stable  Post vital signs: Reviewed and stable  Last Vitals:  Filed Vitals:   02/01/15 1012  BP: 113/64  Pulse:   Temp: 36.4 C  Resp:     Complications: No apparent anesthesia complications

## 2015-02-01 NOTE — Anesthesia Preprocedure Evaluation (Addendum)
Anesthesia Evaluation  Patient identified by MRN, date of birth, ID band Patient awake    Reviewed: Allergy & Precautions, NPO status , Patient's Chart, lab work & pertinent test results  Airway Mallampati: I  TM Distance: >3 FB Neck ROM: Full    Dental   Pulmonary asthma , sleep apnea and Continuous Positive Airway Pressure Ventilation ,  breath sounds clear to auscultation        Cardiovascular hypertension, + Peripheral Vascular Disease Rhythm:Regular Rate:Normal     Neuro/Psych  Headaches,    GI/Hepatic Neg liver ROS, GERD-  ,  Endo/Other  Hypothyroidism Morbid obesity  Renal/GU negative Renal ROS     Musculoskeletal  (+) Arthritis -,   Abdominal   Peds  Hematology negative hematology ROS (+)   Anesthesia Other Findings   Reproductive/Obstetrics                            Anesthesia Physical Anesthesia Plan  ASA: III  Anesthesia Plan: General   Post-op Pain Management:    Induction: Intravenous  Airway Management Planned: Oral ETT  Additional Equipment:   Intra-op Plan:   Post-operative Plan: Extubation in OR  Informed Consent: I have reviewed the patients History and Physical, chart, labs and discussed the procedure including the risks, benefits and alternatives for the proposed anesthesia with the patient or authorized representative who has indicated his/her understanding and acceptance.   Dental advisory given  Plan Discussed with: CRNA  Anesthesia Plan Comments: (Risks and benefits discussed for spinal, general anesthesia and PNB for post-op pain control. Pt reports multiple back surgeries and peripheral nerve injury with previous hip surgery and refuses both neuraxial and PNB as part of her plan at this time. Pt instructed she can ask for PNB in PACU if pain inadequately controlled. Plan for GETA with multimodal pain regimen.)       Anesthesia Quick Evaluation

## 2015-02-01 NOTE — Anesthesia Postprocedure Evaluation (Signed)
  Anesthesia Post-op Note  Patient: Janice [name]  Procedure(s) Performed: Procedure(s): LEFT TOTAL KNEE ARTHROPLASTY (Left)  Patient Location: PACU  Anesthesia Type:General  Level of Consciousness: awake and alert   Airway and Oxygen Therapy: Patient Spontanous Breathing  Post-op Pain: mild  Post-op Assessment: Post-op Vital signs reviewed LLE Motor Response: Purposeful movement LLE Sensation: Full sensation          Post-op Vital Signs: Reviewed  Last Vitals:  Filed Vitals:   02/01/15 1217  BP: 127/64  Pulse: 82  Temp: 36.6 C  Resp: 14    Complications: No apparent anesthesia complications

## 2015-02-01 NOTE — Op Note (Signed)
02/01/2015  9:58 AM  PATIENT:  Janice [name]    PRE-OPERATIVE DIAGNOSIS:  Osteoarthritis Left Knee  POST-OPERATIVE DIAGNOSIS:  Same  PROCEDURE:  LEFT TOTAL KNEE ARTHROPLASTY  SURGEON:  Newt Minion, MD  PHYSICIAN ASSISTANT:None ANESTHESIA:   General  PREOPERATIVE INDICATIONS:  Janice [name] is a  74 y.o. female with a diagnosis of Osteoarthritis Left Knee who failed conservative measures and elected for surgical management.    The risks benefits and alternatives were discussed with the patient preoperatively including but not limited to the risks of infection, bleeding, nerve injury, cardiopulmonary complications, the need for revision surgery, among others, and the patient was willing to proceed.  OPERATIVE IMPLANTS: [place] total knee implants size 5 femur, size 5 tibia, 8 mm polyethylene tray, 32 mm patella.  OPERATIVE FINDINGS: Osteoporotic bone  OPERATIVE PROCEDURE: Patient was brought to the operating room after undergoing a femoral block she then underwent general and aesthetic. After adequate levels anesthesia were obtained patient's left lower extremity was prepped using DuraPrep draped into a sterile field Ioban was used to cover all exposed skin. A timeout was called. A midline incision was made carried down to a medial parapatellar retinacular incision. Using the intramedullary guide 5 of valgus and 9 mm was taken off the distal femur. Attention was then focused on the tibia neutral varus and valgus 3 posterior slope and 10 mm was taken off the tibia. Attention was then focused on the femur cutting blocks were used and the cutting blocks were sized for a size 5 and I'll chamfer cuts were made. The trial femoral component was placed this was tried with the tibial component and the knee was stable with 8 mm polyethylene tray. The keel punch was then made for the tibia lug punches were made for the femur. The patella was resurfaced and 9 mm was taken off the patella and drill  cuts were made for a size 32 mm patella. The wound was irrigated with pulsatile lavage. The popliteal fossa was injected with 20 mL of per L. TXA was placed in the joint for hemostasis. The knee was then irrigated with pulsatile lavage. The tibial and femoral components were cemented in place all loose cement was removed the tibial tray was placed and the knee was left in extension until the cement hardened. The patella was clamped and left in place until the cement hardened. The wound was irrigated with pulsatile lavage. The enhancements were removed the patella tracked midline. The retinaculum was closed using #1 Vicryls. Subcutaneous is closed using 0 Vicryls. A sterile compressive dressing was applied including a compressive dressing from the metatarsal heads up to the tibial tubercle to the patient's significant swelling from venous insufficiency. Patient was extubated taken to the PACU in stable condition.

## 2015-02-01 NOTE — Evaluation (Signed)
Physical Therapy Evaluation Patient Details Name: Janice [name] MRN: [id number] DOB: [birth year] Today's Date: 02/01/2015   History of Present Illness  Lt TKAdue to osteoarthritis  Clinical Impression  Pt is s/p TKA resulting in the deficits listed below (see PT Problem List). Pt will benefit from skilled PT to increase their independence and safety with mobility to allow discharge to SNF. Patient able to tolerate sitting edge of bed briefly before requesting return to lying down due to pain. Will continue to mobilize.     Follow Up Recommendations SNF    Equipment Recommendations  None recommended by PT    Recommendations for Other Services       Precautions / Restrictions Precautions Precautions: Fall Restrictions Weight Bearing Restrictions: Yes LLE Weight Bearing: Weight bearing as tolerated      Mobility  Bed Mobility Overal bed mobility: Needs Assistance Bed Mobility: Supine to Sit;Sit to Supine     Supine to sit: Max assist Sit to supine: Max assist   General bed mobility comments: recommending +2 assist with bed mobility.   Transfers                    Ambulation/Gait                Stairs            Wheelchair Mobility    Modified Rankin (Stroke Patients Only)       Balance Overall balance assessment: Needs assistance Sitting-balance support: Bilateral upper extremity supported Sitting balance-Leahy Scale: Poor                                       Pertinent Vitals/Pain Pain Assessment: 0-10 Pain Score: 8  Pain Location: Lt knee Pain Descriptors / Indicators: Aching Pain Intervention(s): Limited activity within patient's tolerance;Repositioned    Home Living Family/patient expects to be discharged to:: Skilled nursing facility Living Arrangements: Alone                    Prior Function Level of Independence: Independent with assistive device(s)         Comments: using rolling walker and  transport chair     Hand Dominance        Extremity/Trunk Assessment               Lower Extremity Assessment: LLE deficits/detail;RLE deficits/detail RLE Deficits / Details: patient reports Rt. LE weak  from previous neurological injury and prior TKA and THA. LLE Deficits / Details: unable to perform Straight leg raise     Communication   Communication: No difficulties  Cognition   Behavior During Therapy: Anxious Overall Cognitive Status: Within Functional Limits for tasks assessed                      General Comments      Exercises        Assessment/Plan    PT Assessment Patient needs continued PT services  PT Diagnosis Difficulty walking;Generalized weakness;Acute pain   PT Problem List Decreased strength;Decreased range of motion;Decreased activity tolerance;Decreased balance;Decreased mobility  PT Treatment Interventions Gait training;Functional mobility training;Therapeutic activities;Therapeutic exercise;Patient/family education   PT Goals (Current goals can be found in the Care Plan section) Acute Rehab PT Goals Patient Stated Goal: To go on hike in [place] PT Goal Formulation: With patient Time For Goal Achievement: 02/15/15 Potential to Achieve Goals: Fair  Frequency 7X/week   Barriers to discharge        Co-evaluation               End of Session Equipment Utilized During Treatment:  (patient wearing Lt knee brace) Activity Tolerance: Patient limited by fatigue;Patient limited by pain (Sat edge of bed < 1 minute) Patient left: in bed;with call bell/phone within reach;with SCD's reapplied           Time: 1535-1558 PT Time Calculation (min) (ACUTE ONLY): 23 min   Charges:   PT Evaluation $Initial PT Evaluation Tier I: 1 Procedure PT Treatments $Therapeutic Activity: 8-22 mins   PT G Codes:        Cassell Clement, PT, CSCS Pager [phone] Office [phone]  02/01/2015, 4:11 PM

## 2015-02-01 NOTE — Anesthesia Procedure Notes (Signed)
Procedure Name: Intubation Date/Time: 02/01/2015 8:30 AM Performed by: Manuela Schwartz B Pre-anesthesia Checklist: Patient identified, Emergency Drugs available, Suction available, Patient being monitored and Timeout performed Patient Re-evaluated:Patient Re-evaluated prior to inductionOxygen Delivery Method: Circle system utilized Preoxygenation: Pre-oxygenation with 100% oxygen Intubation Type: IV induction and Rapid sequence Laryngoscope Size: Mac and 3 Grade View: Grade I Tube type: Oral Tube size: 7.5 mm Number of attempts: 1 Airway Equipment and Method: Stylet Placement Confirmation: ETT inserted through vocal cords under direct vision,  positive ETCO2 and breath sounds checked- equal and bilateral Secured at: 21 cm Tube secured with: Tape Dental Injury: Teeth and Oropharynx as per pre-operative assessment

## 2015-02-01 NOTE — H&P (Signed)
TOTAL KNEE ADMISSION H&P  Patient is being admitted for left total knee arthroplasty.  Subjective:  Chief Complaint:left knee pain.  HPI: Janice [name], 74 y.o. female, has a history of pain and functional disability in the left knee due to arthritis and has failed non-surgical conservative treatments for greater than 12 weeks to includeNSAID's and/or analgesics, corticosteriod injections, viscosupplementation injections, use of assistive devices and activity modification.  Onset of symptoms was gradual, starting 8 years ago with gradually worsening course since that time. The patient noted no past surgery on the left knee(s).  Patient currently rates pain in the left knee(s) at 8 out of 10 with activity. Patient has night pain, worsening of pain with activity and weight bearing, pain that interferes with activities of daily living, pain with passive range of motion, crepitus and joint swelling.  Patient has evidence of subchondral cysts, subchondral sclerosis, periarticular osteophytes, joint subluxation and joint space narrowing by imaging studies. This patient has had avascular necrosis of the knee. There is no active infection.  Patient Active Problem List   Diagnosis Date Noted  . Symptomatic cholelithiasis 03/25/2014   Past Medical History  Diagnosis Date  . Asthma   . Hypertension, benign   . Trigeminal neuralgia   . Spinal stenosis   . Stenosis of right carotid artery   . Hx of appendectomy   . Muscular weakness   . Heart murmur     echo 09-very mild  . Sciatic pain   . Wears glasses   . Family history of anesthesia complication     SISTER WAKES UP FIGHTING "  . Sleep apnea     sleep study done /w Dr. Maxwell Caul, uses CPAP q night   . Hypothyroidism   . GERD (gastroesophageal reflux disease)   . Headache     migraine history  . Dermatomyositis     affecting muscle, currently in remission    . Arthritis     OSA- knees & shoulders   . Fibromyalgia     perhaps, not sure     . Anemia     rec'd her own blood in the past postop x2     Past Surgical History  Procedure Laterality Date  . Tonsillectomy    . Appendectomy    . Shoulder surgery  2000    right  . Total knee arthroplasty  2001    right  . Lumbar fusion  2004  . Total hip arthroplasty  2005    right  . Cholecystectomy  04/12/2014    DR Lanice Shirts  . Cholecystectomy N/A 04/12/2014    Procedure: LAPAROSCOPIC CHOLECYSTECTOMY;  Surgeon: Coralie Keens, MD;  Location: [facility];  Service: General;  Laterality: N/A;  . Eye surgery      cataracts removed, /w IOL - bilateral     Prescriptions prior to admission  Medication Sig Dispense Refill Last Dose  . Alpha-Lipoic Acid 300 MG CAPS Take 300 mg by mouth daily.     Marland Kitchen aspirin 81 MG tablet Take 81 mg by mouth daily.     Marland Kitchen BENICAR 40 MG tablet Take 40 mg by mouth daily with breakfast.      . BIOTIN PO Take 1 tablet by mouth daily.     . calcium carbonate (TUMS EX) 750 MG chewable tablet Chew 1 tablet by mouth daily.     Marland Kitchen CALCIUM PO Take 500 mg by mouth daily.      . Cholecalciferol (VITAMIN D) 2000 UNITS CAPS Take 2,000 Units  by mouth 2 (two) times daily.     . fesoterodine (TOVIAZ) 8 MG TB24 tablet Take 8 mg by mouth daily.     . fluticasone (FLONASE) 50 MCG/ACT nasal spray Place 1 spray into both nostrils daily as needed for allergies or rhinitis.     . folic acid (FOLVITE) 1 MG tablet Take 1 mg by mouth daily.     Marland Kitchen guaiFENesin (MUCINEX) 600 MG 12 hr tablet Take 600 mg by mouth as needed (congestion).      Marland Kitchen HYDROcodone-acetaminophen (NORCO/VICODIN) 5-325 MG per tablet Take 1 tablet by mouth every 4 (four) hours as needed for moderate pain or severe pain.   0   . KLOR-CON M20 20 MEQ tablet Take 20 mEq by mouth daily.      Marland Kitchen MAGNESIUM PO Take 1 tablet by mouth daily as needed (500mg . each, muscle cramps, takes up to 3 tabs  (total) as needed).      . methotrexate (RHEUMATREX) 2.5 MG tablet Take 10 mg by mouth once a week. On Fridays     . montelukast  (SINGULAIR) 10 MG tablet Take 10 mg by mouth daily.      . naproxen sodium (ANAPROX) 220 MG tablet Take 220 mg by mouth 3 (three) times daily as needed (pain).      . SYNTHROID 112 MCG tablet Take 112 mcg by mouth daily before breakfast.   11   . Albuterol Sulfate (VENTOLIN HFA IN) Inhale 2 puffs into the lungs every 4 (four) hours as needed (shortness of breath).    Unknown at Unknown time  . calcium carbonate (TUMS EX) 750 MG chewable tablet Chew 2 tablets by mouth daily. 1- 2 tabs hs daily      Allergies  Allergen Reactions  . Prednisone Anaphylaxis    psychosis  . Amlodipine Besylate Other (See Comments)    Muscle weakness  . Avapro [Irbesartan] Other (See Comments)    Muscle weakness, fatigue -- States she can only take Benicar  . Ciprofloxacin Other (See Comments)    Confusion w/psychosis  . Furosemide Other (See Comments)    Muscle weakness  . Lipitor [Atorvastatin] Other (See Comments)    Muscle weakness  . Myrbetriq [Mirabegron] Other (See Comments)    Blurred vision   . Neurontin [Gabapentin] Other (See Comments)    Muscle jerking  . Novocain [Procaine]     Does not work  . Penicillins Hives  . Ranitidine     Pt. Knows that it can cause: Confusion & agitation, she is not  willing to take the risk of taking this med.   . Sulfa Antibiotics Other (See Comments)    swelling  . Tizanidine     Confusion   . Tramadol Other (See Comments)    Confusion w/psychosis  . Welchol [Colesevelam Hcl]     History  Substance Use Topics  . Smoking status: Never Smoker   . Smokeless tobacco: Never Used  . Alcohol Use: No    Family History  Problem Relation Age of Onset  . Cancer Mother   . Diabetes Maternal Grandmother   . Heart attack Father   . Cancer Father      Review of Systems  All other systems reviewed and are negative.   Objective:  Physical Exam  Vital signs in last 24 hours: Temp:  [98.1 F (36.7 C)] 98.1 F (36.7 C) (07/06 0725) Pulse Rate:  [89] 89  (07/06 0725) Resp:  [20] 20 (07/06 0725) BP: (129)/(72) 129/72 mmHg (  07/06 0725) SpO2:  [96 %] 96 % (07/06 0725) Weight:  [111.585 kg (246 lb)] 111.585 kg (246 lb) (07/06 0725)  Labs:   Estimated body mass index is 46.51 kg/(m^2) as calculated from the following:   Height as of this encounter: 5\' 1"  (1.549 m).   Weight as of this encounter: 111.585 kg (246 lb).   Imaging Review Plain radiographs demonstrate moderate degenerative joint disease of the left knee(s). The overall alignment ismild varus. The bone quality appears to be adequate for age and reported activity level.  Assessment/Plan:  End stage arthritis, left knee   The patient history, physical examination, clinical judgment of the provider and imaging studies are consistent with end stage degenerative joint disease of the left knee(s) and total knee arthroplasty is deemed medically necessary. The treatment options including medical management, injection therapy arthroscopy and arthroplasty were discussed at length. The risks and benefits of total knee arthroplasty were presented and reviewed. The risks due to aseptic loosening, infection, stiffness, patella tracking problems, thromboembolic complications and other imponderables were discussed. The patient acknowledged the explanation, agreed to proceed with the plan and consent was signed. Patient is being admitted for inpatient treatment for surgery, pain control, PT, OT, prophylactic antibiotics, VTE prophylaxis, progressive ambulation and ADL's and discharge planning. The patient is planning to be discharged to skilled nursing facility

## 2015-02-02 ENCOUNTER — Encounter (HOSPITAL_COMMUNITY): Payer: Self-pay | Admitting: General Practice

## 2015-02-02 LAB — CBC
HCT: 31.5 % — ABNORMAL LOW (ref 36.0–46.0)
Hemoglobin: 10.2 g/dL — ABNORMAL LOW (ref 12.0–15.0)
MCH: 29 pg (ref 26.0–34.0)
MCHC: 32.4 g/dL (ref 30.0–36.0)
MCV: 89.5 fL (ref 78.0–100.0)
Platelets: 262 10*3/uL (ref 150–400)
RBC: 3.52 MIL/uL — ABNORMAL LOW (ref 3.87–5.11)
RDW: 13.4 % (ref 11.5–15.5)
WBC: 6.9 10*3/uL (ref 4.0–10.5)

## 2015-02-02 LAB — BASIC METABOLIC PANEL WITH GFR
Anion gap: 6 (ref 5–15)
BUN: 22 mg/dL — ABNORMAL HIGH (ref 6–20)
CO2: 27 mmol/L (ref 22–32)
Calcium: 8.5 mg/dL — ABNORMAL LOW (ref 8.9–10.3)
Chloride: 102 mmol/L (ref 101–111)
Creatinine, Ser: 0.67 mg/dL (ref 0.44–1.00)
GFR calc Af Amer: >60 mL/min
GFR calc non Af Amer: >60 mL/min
Glucose, Bld: 101 mg/dL — ABNORMAL HIGH (ref 65–99)
Potassium: 4.5 mmol/L (ref 3.5–5.1)
Sodium: 135 mmol/L (ref 135–145)

## 2015-02-02 NOTE — Plan of Care (Signed)
Problem: Consults Goal: Diagnosis- Total Joint Replacement Outcome: Completed/Met Date Met:  02/02/15 Primary Total Knee left

## 2015-02-02 NOTE — Progress Notes (Signed)
Pt has home machine and states she does not need any assistance. RT will continue to monitor.

## 2015-02-02 NOTE — Progress Notes (Signed)
Patient stated that she wears her CPAP every night and will put it on herself when she is ready to go to sleep

## 2015-02-02 NOTE — Progress Notes (Signed)
Physical Therapy Treatment Patient Details Name: Janice [name] MRN: [id number] DOB: 01/27/1941 Today's Date: 02/02/2015    History of Present Illness Lt TKA due to osteoarthritis    PT Comments    Pt is s/p TKA resulting in the deficits listed below (see PT Problem List). Pt will benefit from skilled PT to increase their independence and safety with mobility to SNF. Able to sit edge of bed during morning session for approximately 5 minutes. Slow mobilization anticipated.     Follow Up Recommendations  SNF     Equipment Recommendations  None recommended by PT    Recommendations for Other Services       Precautions / Restrictions Precautions Precautions: Fall Required Braces or Orthoses:  (Rt. AFO when standing) Restrictions Weight Bearing Restrictions: Yes LLE Weight Bearing: Weight bearing as tolerated    Mobility  Bed Mobility Overal bed mobility: Needs Assistance Bed Mobility: Supine to Sit;Sit to Supine     Supine to sit: Mod assist     General bed mobility comments: using bed rails and elevated head of bed with supine to sitting.   Transfers                    Ambulation/Gait                 Stairs            Wheelchair Mobility    Modified Rankin (Stroke Patients Only)       Balance Overall balance assessment: Needs assistance Sitting-balance support: Bilateral upper extremity supported;Feet supported Sitting balance-Leahy Scale: Poor                              Cognition Arousal/Alertness: Awake/alert Behavior During Therapy: Anxious Overall Cognitive Status: Within Functional Limits for tasks assessed                      Exercises Total Joint Exercises Goniometric ROM: 6-51 Other Exercises Other Exercises: seated knee flexion stretch    General Comments        Pertinent Vitals/Pain Pain Assessment: 0-10 Pain Score: 7  Pain Location: Lt knee Pain Descriptors / Indicators: Aching Pain  Intervention(s): Monitored during session;Limited activity within patient's tolerance    Home Living Family/patient expects to be discharged to:: Other (Comment) (rehab ) Living Arrangements: Alone                  Prior Function            PT Goals (current goals can now be found in the care plan section) Acute Rehab PT Goals Patient Stated Goal: To go on hike in [place] PT Goal Formulation: With patient Time For Goal Achievement: 02/15/15 Potential to Achieve Goals: Fair Progress towards PT goals: Progressing toward goals    Frequency  7X/week    PT Plan Current plan remains appropriate    Co-evaluation             End of Session   Activity Tolerance: Patient tolerated treatment well Patient left: in bed;with call bell/phone within reach     Time: 1059-1120 PT Time Calculation (min) (ACUTE ONLY): 21 min  Charges:  $Therapeutic Activity: 8-22 mins                    G Codes:      Cassell Clement, PT, CSCS Pager [phone] Office [phone]  [phone]  02/02/2015, 11:29 AM

## 2015-02-02 NOTE — Progress Notes (Signed)
Physical Therapy Treatment Patient Details Name: Janice [name] MRN: [id number] DOB: 09/03/1940 Today's Date: 02/02/2015    History of Present Illness Lt TKA due to osteoarthritis    PT Comments    Patient requiring +2 assistance with mobility. Able to stand at edge of bed approximately 10 seconds before patient sitting down. Sat edge of bed for rest but patient refusing more attempts. One previous attempt at standing performed but not successful. Slow to mobilize, SNF placement appropriate.   Follow Up Recommendations  SNF     Equipment Recommendations  None recommended by PT    Recommendations for Other Services       Precautions / Restrictions Precautions Precautions: Fall;Knee Required Braces or Orthoses: Other Brace/Splint (Rt AFO when standing) Restrictions Weight Bearing Restrictions: Yes LLE Weight Bearing: Weight bearing as tolerated    Mobility  Bed Mobility Overal bed mobility: +2 for physical assistance Bed Mobility: Supine to Sit;Sit to Supine     Supine to sit: +2 for physical assistance;Mod assist Sit to supine: Max assist;+2 for physical assistance   General bed mobility comments: using bed rails and elevated head of bed with supine to sitting.   Transfers Overall transfer level: Needs assistance Equipment used: Rolling walker (2 wheeled) Transfers: Sit to/from Stand Sit to Stand: +2 physical assistance;Max assist         General transfer comment: stood edge of bed approximately 10 seconds before reporting that she was unable to continue.   Ambulation/Gait                 Stairs            Wheelchair Mobility    Modified Rankin (Stroke Patients Only)       Balance Overall balance assessment: Needs assistance Sitting-balance support: Bilateral upper extremity supported Sitting balance-Leahy Scale: Poor     Standing balance support: Bilateral upper extremity supported Standing balance-Leahy Scale: Poor                       Cognition Arousal/Alertness: Awake/alert Behavior During Therapy: Anxious Overall Cognitive Status: Within Functional Limits for tasks assessed                      Exercises Total Joint Exercises Goniometric ROM: 6-51 Other Exercises Other Exercises: seated knee flexion stretch    General Comments        Pertinent Vitals/Pain Pain Assessment: 0-10 Pain Score: 8  Pain Location: Lt knee Pain Descriptors / Indicators: Aching Pain Intervention(s): Monitored during session    Home Living                      Prior Function            PT Goals (current goals can now be found in the care plan section) Acute Rehab PT Goals Patient Stated Goal: To go on hike in [place] PT Goal Formulation: With patient Time For Goal Achievement: 02/15/15 Potential to Achieve Goals: Fair Progress towards PT goals: Progressing toward goals    Frequency  7X/week    PT Plan Current plan remains appropriate    Co-evaluation             End of Session Equipment Utilized During Treatment: Gait belt Activity Tolerance: Patient limited by pain Patient left: in bed;with call bell/phone within reach     Time: 1400-1424 PT Time Calculation (min) (ACUTE ONLY): 24 min  Charges:  $Therapeutic Activity: 23-37 mins  G Codes:      Cassell Clement, PT, CSCS Pager [phone] Office [phone]  02/02/2015, 3:07 PM

## 2015-02-02 NOTE — Clinical Social Work Placement (Signed)
   CLINICAL SOCIAL WORK PLACEMENT  NOTE  Date:  02/02/2015  Patient Details  Name: Janice [name] MRN: [id number] Date of Birth: Sep 10, 1940  Clinical Social Work is seeking post-discharge placement for this patient at the [place] level of care (*CSW will initial, date and re-position this form in  chart as items are completed):  Yes   Patient/family provided with [place] Work Department's list of facilities offering this level of care within the geographic area requested by the patient (or if unable, by the patient's family).  Yes   Patient/family informed of their freedom to choose among providers that offer the needed level of care, that participate in Medicare, Medicaid or managed care program needed by the patient, have an available bed and are willing to accept the patient.  Yes   Patient/family informed of 's ownership interest in [facility] and [facility], as well as of the fact that they are under no obligation to receive care at these facilities.  PASRR submitted to EDS on 02/02/15     PASRR number received on 02/02/15     Existing PASRR number confirmed on  (n/a)     FL2 transmitted to all facilities in geographic area requested by pt/family on 02/02/15     FL2 transmitted to all facilities within larger geographic area on  (n/a)     Patient informed that his/her managed care company has contracts with or will negotiate with certain facilities, including the following:   (yes, [facility])     Yes   Patient/family informed of bed offers received.  Patient chooses bed at [facility] [place] recommends and patient chooses bed at  (n/a)    Patient to be transferred to [facility] on 02/04/15.  Patient to be transferred to facility by [facility]     Patient family notified on   of transfer.  Name of family member notified:        PHYSICIAN Please sign FL2     Additional Comment:     _______________________________________________ Caroline Sauger, LCSW 02/02/2015, 3:02 PM [phone]

## 2015-02-02 NOTE — Clinical Social Work Note (Signed)
Clinical Social Work Assessment  Patient Details  Name: Janice [name] MRN: [id number] Date of Birth: January 10, 1941  Date of referral:  02/02/15               Reason for consult:  Discharge Planning, Facility Placement                Permission sought to share information with:  Chartered certified accountant granted to share information::  Yes, Verbal Permission Granted  Name::     n/a  Agency::  [facility]  Relationship::  n/a  Contact Information:  n/a  Housing/Transportation Living arrangements for the past 2 months:  Single Family Home Source of Information:  Patient Patient Interpreter Needed:  None Criminal Activity/Legal Involvement Pertinent to Current Situation/Hospitalization:  No - Comment as needed Significant Relationships:  Other Family Members Lives with:  Self Do you feel safe going back to the place where you live?  No (High fall risk.) Need for family participation in patient care:  No (Coment) (Patient able to make own decisions.)  Care giving concerns:  Patient expressed no concerns at this time.   Social Worker assessment / plan:  CSW received referral for possible SNF placement at time of discharge. CSW met with patient at bedside to discuss discharge disposition. Per patient, patient to discharge to [facility] SNF once medically stable for discharge. CSW confirmed discharge plan with [facility] admissions liaison. Per admissions liaison, patient to be admitted to facility on Saturday 02/04/2015. CSW to continue to follow and assist with discharge planning needs.  Employment status:  Retired Forensic scientist:  Medicare PT Recommendations:  [place] / Referral to community resources:  [place]  Patient/Family's Response to care:  Patient understanding and agreeable to CSW plan of care.  Patient/Family's Understanding of and Emotional Response to Diagnosis, Current Treatment, and Prognosis:  Patient  understanding and agreeable to CSW plan of care.  Emotional Assessment Appearance:  Appears stated age Attitude/Demeanor/Rapport:   (Pleasant) Affect (typically observed):  Accepting, Appropriate, Pleasant Orientation:  Oriented to Self, Oriented to Place, Oriented to  Time, Oriented to Situation Alcohol / Substance use:  Not Applicable Psych involvement (Current and /or in the community):  No (Comment) (Not appropriate on this admission.)  Discharge Needs  Concerns to be addressed:  No discharge needs identified Readmission within the last 30 days:  No Current discharge risk:  None Barriers to Discharge:  No Barriers Identified   Caroline Sauger, LCSW 02/02/2015, 2:57 PM [phone]

## 2015-02-02 NOTE — Progress Notes (Signed)
[name], female   DOB: [birth year], 74 y.o.   MRN: [id number] Postoperative day 1 status post left total knee arthroplasty. Patient does complain of some pain. We will work with physical therapy. Anticipate discharge to [facility] care. Most likely will need to be discharged on Saturday.

## 2015-02-02 NOTE — Progress Notes (Signed)
OT Cancellation Note  Patient Details Name: Janice [name] MRN: [id number] DOB: 06-12-1941   Cancelled Treatment:    Reason Eval/Treat Not Completed: Other (comment) Pt is Medicare/Medicaid and current D/C plan is SNF. No apparent immediate acute care OT needs, therefore will defer OT to SNF. If OT eval is needed please call Acute Rehab Dept. at [phone] or text page OT at [phone].   [place], OTR/L  [phone] 02/02/2015 02/02/2015, 7:27 AM

## 2015-02-03 LAB — CBC
HCT: 29.1 % — ABNORMAL LOW (ref 36.0–46.0)
Hemoglobin: 9.8 g/dL — ABNORMAL LOW (ref 12.0–15.0)
MCH: 30.2 pg (ref 26.0–34.0)
MCHC: 33.7 g/dL (ref 30.0–36.0)
MCV: 89.5 fL (ref 78.0–100.0)
Platelets: 240 10*3/uL (ref 150–400)
RBC: 3.25 MIL/uL — ABNORMAL LOW (ref 3.87–5.11)
RDW: 13.4 % (ref 11.5–15.5)
WBC: 9 10*3/uL (ref 4.0–10.5)

## 2015-02-03 MED ORDER — OXYCODONE-ACETAMINOPHEN 5-325 MG PO TABS: 1.0000 | ORAL_TABLET | ORAL | Status: DC | PRN

## 2015-02-03 MED ORDER — HYDROCODONE-ACETAMINOPHEN 5-325 MG PO TABS: 1.0000 | ORAL_TABLET | Freq: Four times a day (QID) | ORAL | Status: DC | PRN

## 2015-02-03 MED ORDER — ASPIRIN EC 325 MG PO TBEC: 325.0000 mg | DELAYED_RELEASE_TABLET | Freq: Every day | ORAL | Status: DC

## 2015-02-03 NOTE — Progress Notes (Signed)
[name], female   DOB: [birth year], 74 y.o.   MRN: [id number] Postoperative day 2 total knee arthroplasty. Change dressing today. Orders written prescriptions written FL 2 signed for discharge to skilled nursing tomorrow.

## 2015-02-03 NOTE — Progress Notes (Signed)
Offered patient miralax d/t last bowel movement 3 days ago, but patient is refusing at this time. She states she is passing gas, has not eaten very much, does not feel constipated, and does not want to risk having loose stools at this time.

## 2015-02-03 NOTE — Progress Notes (Signed)
Patient places self on home cpap

## 2015-02-03 NOTE — Progress Notes (Signed)
Physical Therapy Treatment Patient Details Name: Janice [name] MRN: [id number] DOB: [birth year] Today's Date: 02/03/2015    History of Present Illness Lt TKA due to osteoarthritis    PT Comments    Patient slow with mobility progression. Attempted bed exercises in afternoon. Patient complaining of cramping with knee flexion, very minimal ROM achieved. Encouraging patient to work on HEP in bed as able. Copy of HEP provided to patient. Anticipating D/C to SNF on Saturday.   Follow Up Recommendations  SNF     Equipment Recommendations  None recommended by PT    Recommendations for Other Services       Precautions / Restrictions Precautions Precautions: Fall;Knee Precaution Booklet Issued: Yes (comment) (HEP provided) Required Braces or Orthoses:  (Rt AFO with standing) Restrictions Weight Bearing Restrictions: Yes LLE Weight Bearing: Weight bearing as tolerated    Mobility  Bed Mobility                  Transfers                    Ambulation/Gait                 Stairs            Wheelchair Mobility    Modified Rankin (Stroke Patients Only)       Balance                                    Cognition Arousal/Alertness: Awake/alert Behavior During Therapy: Anxious Overall Cognitive Status: Within Functional Limits for tasks assessed                      Exercises Total Joint Exercises Ankle Circles/Pumps: AROM;Both;10 reps;Supine Quad Sets: Strengthening;Left;10 reps;Supine Gluteal Sets: Strengthening;Both;10 reps;Supine Heel Slides: AAROM;Left;10 reps;Supine Hip ABduction/ADduction: 10 reps;Strengthening;Supine Straight Leg Raises: Supine;5 reps;Strengthening (max assist) Goniometric ROM: 10-22    General Comments        Pertinent Vitals/Pain Pain Assessment: 0-10 Pain Score: 8  Pain Location: Lt leg Pain Descriptors / Indicators: Aching Pain Intervention(s): Repositioned    Home Living                       Prior Function            PT Goals (current goals can now be found in the care plan section) Acute Rehab PT Goals Patient Stated Goal: To go on hike in [place] PT Goal Formulation: With patient Time For Goal Achievement: 02/15/15 Potential to Achieve Goals: Fair Progress towards PT goals: Progressing toward goals (slow mobility progress)    Frequency  7X/week    PT Plan Current plan remains appropriate    Co-evaluation             End of Session   Activity Tolerance: Patient limited by pain Patient left: in bed;with call bell/phone within reach     Time: 2542-7062 PT Time Calculation (min) (ACUTE ONLY): 18 min  Charges:  $Therapeutic Exercise: 8-22 mins                    G Codes:      Cassell Clement, PT, CSCS Pager [phone] Office [phone]  02/03/2015, 3:42 PM

## 2015-02-03 NOTE — Care Management (Signed)
Important Message  Patient Details  Name: Janice [name] MRN: [id number] Date of Birth: [birth year]   Medicare Important Message Given:  Yes-second notification given    Delorse Lek 02/03/2015, 4:15 PM

## 2015-02-03 NOTE — Care Management Note (Signed)
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 CM consult completed.    Expected Discharge Date:                  Expected Discharge Plan:  Skilled Nursing Facility  In-House Referral:  Clinical Social Work  Discharge planning Services  CM Consult  Post Acute Care Choice:    Choice offered to:     DME Arranged:    DME Agency:     HH Arranged:    [place] Agency:     Status of Service:  Completed, signed off  Medicare Important Message Given:   Date Medicare IM Given:    Medicare IM give by:    Date Additional Medicare IM Given:    Additional Medicare Important Message give by:     If discussed at [place] of Stay Meetings, dates discussed:    Additional Comments:  Guido Sander, RN 02/03/2015, 8:00 PM

## 2015-02-03 NOTE — Discharge Summary (Signed)
Physician Discharge Summary  [name] MRN: [id number] DOB/AGE: 01/17/1941 74 y.o.  Admit date: 02/01/2015 Discharge date: 02/03/2015  Admission Diagnoses: Osteoarthritis left knee  Discharge Diagnoses:  Active Problems:   Total knee replacement status   Discharged Condition: stable  Hospital Course: Patient's hospital course was essentially unremarkable. She underwent total knee arthroplasty postoperatively she was discharged back to skilled care  Consults: None  Significant Diagnostic Studies: labs: Routine labs  Treatments: surgery: See operative note  Discharge Exam: Blood pressure 117/73, pulse 81, temperature 97.7 F (36.5 C), temperature source Oral, resp. rate 18, height 5\' 1"  (1.549 m), weight 111.585 kg (246 lb), SpO2 95 %. Incision/Wound: dressing clean and dry  Disposition: 01-Home or Self Care  Discharge Instructions    Call MD / Call 911    Complete by:  As directed   If you experience chest pain or shortness of breath, CALL 911 and be transported to the hospital emergency room.  If you develope a fever above 101 F, pus (white drainage) or increased drainage or redness at the wound, or calf pain, call your surgeon's office.     Constipation Prevention    Complete by:  As directed   Drink plenty of fluids.  Prune juice may be helpful.  You may use a stool softener, such as Colace (over the counter) 100 mg twice a day.  Use MiraLax (over the counter) for constipation as needed.     Diet - low sodium heart healthy    Complete by:  As directed      Increase activity slowly as tolerated    Complete by:  As directed      Weight bearing as tolerated    Complete by:  As directed             Medication List    TAKE these medications        Alpha-Lipoic Acid 300 MG Caps  Take 300 mg by mouth daily.     aspirin EC 325 MG tablet  Take 1 tablet (325 mg total) by mouth daily.     aspirin 81 MG tablet  Take 81 mg by mouth daily.     BENICAR 40  MG tablet  Generic drug:  olmesartan  Take 40 mg by mouth daily with breakfast.     BIOTIN PO  Take 1 tablet by mouth daily.     calcium carbonate 750 MG chewable tablet  Commonly known as:  TUMS EX  Chew 2 tablets by mouth daily. 1- 2 tabs hs daily     calcium carbonate 750 MG chewable tablet  Commonly known as:  TUMS EX  Chew 1 tablet by mouth daily.     CALCIUM PO  Take 500 mg by mouth daily.     fluticasone 50 MCG/ACT nasal spray  Commonly known as:  FLONASE  Place 1 spray into both nostrils daily as needed for allergies or rhinitis.     folic acid 1 MG tablet  Commonly known as:  FOLVITE  Take 1 mg by mouth daily.     guaiFENesin 600 MG 12 hr tablet  Commonly known as:  MUCINEX  Take 600 mg by mouth as needed (congestion).     HYDROcodone-acetaminophen 5-325 MG per tablet  Commonly known as:  NORCO/VICODIN  Take 1 tablet by mouth every 4 (four) hours as needed for moderate pain or severe pain.     HYDROcodone-acetaminophen 5-325 MG per tablet  Commonly known as:  NORCO  Take 1 tablet by mouth every 6 (six) hours as needed.     KLOR-CON M20 20 MEQ tablet  Generic drug:  potassium chloride SA  Take 20 mEq by mouth daily.     MAGNESIUM PO  Take 1 tablet by mouth daily as needed (500mg . each, muscle cramps, takes up to 3 tabs  (total) as needed).     methotrexate 2.5 MG tablet  Commonly known as:  RHEUMATREX  Take 10 mg by mouth once a week. On Fridays     montelukast 10 MG tablet  Commonly known as:  SINGULAIR  Take 10 mg by mouth daily.     naproxen sodium 220 MG tablet  Commonly known as:  ANAPROX  Take 220 mg by mouth 3 (three) times daily as needed (pain).     oxyCODONE-acetaminophen 5-325 MG per tablet  Commonly known as:  ROXICET  Take 1 tablet by mouth every 4 (four) hours as needed for severe pain.     SYNTHROID 112 MCG tablet  Generic drug:  levothyroxine  Take 112 mcg by mouth daily before breakfast.     TOVIAZ 8 MG Tb24 tablet  Generic  drug:  fesoterodine  Take 8 mg by mouth daily.     VENTOLIN HFA IN  Inhale 2 puffs into the lungs every 4 (four) hours as needed (shortness of breath).     Vitamin D 2000 UNITS Caps  Take 2,000 Units by mouth 2 (two) times daily.           Follow-up Information    Follow up with Margeart Allender V, MD In 2 weeks.   Specialty:  Orthopedic Surgery   Contact information:   [place] [address] [phone]       Signed: Newt Minion 02/03/2015, 6:54 AM

## 2015-02-03 NOTE — Progress Notes (Signed)
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 Recommending transition to SNF for further rehabilitation before returning home.   Follow Up Recommendations  SNF     Equipment Recommendations  None recommended by PT    Recommendations for Other Services       Precautions / Restrictions Precautions Precautions: Fall;Knee Required Braces or Orthoses: Other Brace/Splint (Rt AFO) Restrictions Weight Bearing Restrictions: Yes LLE Weight Bearing: Weight bearing as tolerated    Mobility  Bed Mobility Overal bed mobility: Needs Assistance Bed Mobility: Supine to Sit     Supine to sit: Mod assist;HOB elevated        Transfers Overall transfer level: Needs assistance Equipment used: Rolling walker (2 wheeled) Transfers: Sit to/from Stand Sit to Stand: +2 physical assistance         General transfer comment: stood edge of bed, unable to attempt steps, chair placed behind patient for sitting.   Ambulation/Gait                 Stairs            Wheelchair Mobility    Modified Rankin (Stroke Patients Only)       Balance Overall balance assessment: Needs assistance Sitting-balance support: Single extremity supported Sitting balance-Leahy Scale: Poor     Standing balance support: Bilateral upper extremity supported Standing balance-Leahy Scale: Poor                      Cognition Arousal/Alertness: Awake/alert Behavior During Therapy: Anxious Overall Cognitive Status: Within Functional Limits for tasks assessed                      Exercises      General Comments        Pertinent Vitals/Pain Pain Assessment: 0-10 Pain Score: 7  Pain Location: Lt knee Pain Descriptors / Indicators: Aching Pain Intervention(s): Monitored during  session;Repositioned    Home Living                      Prior Function            PT Goals (current goals can now be found in the care plan section) Acute Rehab PT Goals Patient Stated Goal: To go on hike in [place] PT Goal Formulation: With patient Time For Goal Achievement: 02/15/15 Potential to Achieve Goals: Fair Progress towards PT goals: Progressing toward goals    Frequency  7X/week    PT Plan Current plan remains appropriate    Co-evaluation             End of Session Equipment Utilized During Treatment: Gait belt (Rt AFO) Activity Tolerance: Patient limited by pain;Patient limited by fatigue Patient left: in chair;with call bell/phone within reach     Time: 0957-1023 PT Time Calculation (min) (ACUTE ONLY): 26 min  Charges:  $Therapeutic Activity: 23-37 mins                    G Codes:      Cassell Clement, PT, CSCS Pager [phone] Office [phone]  02/03/2015, 11:27 AM

## 2015-02-04 DIAGNOSIS — I10 Essential (primary) hypertension: Secondary | ICD-10-CM | POA: Diagnosis not present

## 2015-02-04 DIAGNOSIS — M6281 Muscle weakness (generalized): Secondary | ICD-10-CM | POA: Diagnosis not present

## 2015-02-04 DIAGNOSIS — Z4789 Encounter for other orthopedic aftercare: Secondary | ICD-10-CM | POA: Diagnosis not present

## 2015-02-04 DIAGNOSIS — K5900 Constipation, unspecified: Secondary | ICD-10-CM | POA: Diagnosis not present

## 2015-02-04 DIAGNOSIS — J45909 Unspecified asthma, uncomplicated: Secondary | ICD-10-CM | POA: Diagnosis not present

## 2015-02-04 DIAGNOSIS — M62838 Other muscle spasm: Secondary | ICD-10-CM | POA: Diagnosis not present

## 2015-02-04 DIAGNOSIS — M25562 Pain in left knee: Secondary | ICD-10-CM | POA: Diagnosis not present

## 2015-02-04 DIAGNOSIS — Z96652 Presence of left artificial knee joint: Secondary | ICD-10-CM | POA: Diagnosis not present

## 2015-02-04 DIAGNOSIS — M1712 Unilateral primary osteoarthritis, left knee: Secondary | ICD-10-CM | POA: Diagnosis not present

## 2015-02-04 DIAGNOSIS — Z471 Aftercare following joint replacement surgery: Secondary | ICD-10-CM | POA: Diagnosis not present

## 2015-02-04 DIAGNOSIS — I890 Lymphedema, not elsewhere classified: Secondary | ICD-10-CM | POA: Diagnosis not present

## 2015-02-04 DIAGNOSIS — R2689 Other abnormalities of gait and mobility: Secondary | ICD-10-CM | POA: Diagnosis not present

## 2015-02-04 LAB — CBC
HCT: 29.9 % — ABNORMAL LOW (ref 36.0–46.0)
Hemoglobin: 9.8 g/dL — ABNORMAL LOW (ref 12.0–15.0)
MCH: 29.4 pg (ref 26.0–34.0)
MCHC: 32.8 g/dL (ref 30.0–36.0)
MCV: 89.8 fL (ref 78.0–100.0)
Platelets: 237 K/uL (ref 150–400)
RBC: 3.33 MIL/uL — ABNORMAL LOW (ref 3.87–5.11)
RDW: 13.4 % (ref 11.5–15.5)
WBC: 9.1 K/uL (ref 4.0–10.5)

## 2015-02-04 NOTE — Progress Notes (Signed)
Pt is ready for discharge, hand off report gave to the nurse, [facility] at [facility]. Helped the EMS to transport pt 's belongings. Pain control is managed.

## 2015-02-04 NOTE — Progress Notes (Signed)
LCSW following for disposition.  Patient going to [facility] today per MD and RN. Patient agreeable, Alert and oriented x4.  Reports some frustrations regarding medications outcomes and switching medications, but ultimately very excited about leaving and beginning rehabilitation.  Patient does not want any family contacted at this time. She has her phone and will call.  LCSW completed DC paperwork and called for transport via [facility].  Lane Hacker, MSW Clinical Social Work: Emergency Room [phone]

## 2015-02-04 NOTE — Clinical Social Work Placement (Signed)
   CLINICAL SOCIAL WORK PLACEMENT  NOTE  Date:  02/04/2015  Patient Details  Name: Janice [name] MRN: [id number] Date of Birth: [birth year]  Clinical Social Work is seeking post-discharge placement for this patient at the [place] level of care (*CSW will initial, date and re-position this form in  chart as items are completed):  Yes   Patient/family provided with [facility] Work Department's list of facilities offering this level of care within the geographic area requested by the patient (or if unable, by the patient's family).  Yes   Patient/family informed of their freedom to choose among providers that offer the needed level of care, that participate in Medicare, Medicaid or managed care program needed by the patient, have an available bed and are willing to accept the patient.  Yes   Patient/family informed of [facility]'s ownership interest in [facility] and [facility], as well as of the fact that they are under no obligation to receive care at these facilities.  PASRR submitted to EDS on 02/02/15     PASRR number received on 02/02/15     Existing PASRR number confirmed on  (n/a)     FL2 transmitted to all facilities in geographic area requested by pt/family on 02/02/15     FL2 transmitted to all facilities within larger geographic area on  (n/a)     Patient informed that his/her managed care company has contracts with or will negotiate with certain facilities, including the following:   (yes, [facility])     Yes   Patient/family informed of bed offers received.  Patient chooses bed at [facility] recommends and patient chooses bed at  (n/a)    Patient to be transferred to [facility] [place] on 02/04/15.  Patient to be transferred to facility by [facility]     Patient family notified on   of transfer.  None patient declined  Name of family member notified:      NA  PHYSICIAN Please sign FL2      Additional Comment:    _______________________________________________ Lilly Cove, LCSW 02/04/2015, 10:21 AM

## 2015-02-04 NOTE — Progress Notes (Signed)
PT Cancellation Note  Patient Details Name: Janice [name] MRN: [id number] DOB: 10-Jul-1941   Cancelled Treatment:    Reason Eval/Treat Not Completed: Patient declined, no reason specified;Pain limiting ability to participate.  Social worker also reports pending transfer to SNF.   Starlit Raburn L 02/04/2015, 11:35 AM

## 2015-02-06 DIAGNOSIS — R2689 Other abnormalities of gait and mobility: Secondary | ICD-10-CM | POA: Diagnosis not present

## 2015-02-06 DIAGNOSIS — M6281 Muscle weakness (generalized): Secondary | ICD-10-CM | POA: Diagnosis not present

## 2015-02-06 DIAGNOSIS — K5900 Constipation, unspecified: Secondary | ICD-10-CM | POA: Diagnosis not present

## 2015-02-06 DIAGNOSIS — J45909 Unspecified asthma, uncomplicated: Secondary | ICD-10-CM | POA: Diagnosis not present

## 2015-02-06 DIAGNOSIS — I10 Essential (primary) hypertension: Secondary | ICD-10-CM | POA: Diagnosis not present

## 2015-02-06 DIAGNOSIS — Z471 Aftercare following joint replacement surgery: Secondary | ICD-10-CM | POA: Diagnosis not present

## 2015-02-08 DIAGNOSIS — M25562 Pain in left knee: Secondary | ICD-10-CM | POA: Diagnosis not present

## 2015-02-08 DIAGNOSIS — M62838 Other muscle spasm: Secondary | ICD-10-CM | POA: Diagnosis not present

## 2015-02-16 DIAGNOSIS — M1712 Unilateral primary osteoarthritis, left knee: Secondary | ICD-10-CM | POA: Diagnosis not present

## 2015-02-22 DIAGNOSIS — M25562 Pain in left knee: Secondary | ICD-10-CM | POA: Diagnosis not present

## 2015-02-22 DIAGNOSIS — I890 Lymphedema, not elsewhere classified: Secondary | ICD-10-CM | POA: Diagnosis not present

## 2015-02-22 DIAGNOSIS — M62838 Other muscle spasm: Secondary | ICD-10-CM | POA: Diagnosis not present

## 2015-03-03 DIAGNOSIS — R2689 Other abnormalities of gait and mobility: Secondary | ICD-10-CM | POA: Diagnosis not present

## 2015-03-03 DIAGNOSIS — M25562 Pain in left knee: Secondary | ICD-10-CM | POA: Diagnosis not present

## 2015-03-07 DIAGNOSIS — M6281 Muscle weakness (generalized): Secondary | ICD-10-CM | POA: Diagnosis not present

## 2015-03-07 DIAGNOSIS — M1990 Unspecified osteoarthritis, unspecified site: Secondary | ICD-10-CM | POA: Diagnosis not present

## 2015-03-07 DIAGNOSIS — Z96651 Presence of right artificial knee joint: Secondary | ICD-10-CM | POA: Diagnosis not present

## 2015-03-07 DIAGNOSIS — Z471 Aftercare following joint replacement surgery: Secondary | ICD-10-CM | POA: Diagnosis not present

## 2015-03-10 DIAGNOSIS — Z471 Aftercare following joint replacement surgery: Secondary | ICD-10-CM | POA: Diagnosis not present

## 2015-03-10 DIAGNOSIS — Z96651 Presence of right artificial knee joint: Secondary | ICD-10-CM | POA: Diagnosis not present

## 2015-03-10 DIAGNOSIS — M6281 Muscle weakness (generalized): Secondary | ICD-10-CM | POA: Diagnosis not present

## 2015-03-10 DIAGNOSIS — M1990 Unspecified osteoarthritis, unspecified site: Secondary | ICD-10-CM | POA: Diagnosis not present

## 2015-03-14 DIAGNOSIS — Z471 Aftercare following joint replacement surgery: Secondary | ICD-10-CM | POA: Diagnosis not present

## 2015-03-14 DIAGNOSIS — M1990 Unspecified osteoarthritis, unspecified site: Secondary | ICD-10-CM | POA: Diagnosis not present

## 2015-03-14 DIAGNOSIS — Z96651 Presence of right artificial knee joint: Secondary | ICD-10-CM | POA: Diagnosis not present

## 2015-03-14 DIAGNOSIS — M6281 Muscle weakness (generalized): Secondary | ICD-10-CM | POA: Diagnosis not present

## 2015-03-17 DIAGNOSIS — Z96651 Presence of right artificial knee joint: Secondary | ICD-10-CM | POA: Diagnosis not present

## 2015-03-17 DIAGNOSIS — M6281 Muscle weakness (generalized): Secondary | ICD-10-CM | POA: Diagnosis not present

## 2015-03-17 DIAGNOSIS — Z471 Aftercare following joint replacement surgery: Secondary | ICD-10-CM | POA: Diagnosis not present

## 2015-03-17 DIAGNOSIS — M1990 Unspecified osteoarthritis, unspecified site: Secondary | ICD-10-CM | POA: Diagnosis not present

## 2015-03-21 DIAGNOSIS — M4806 Spinal stenosis, lumbar region: Secondary | ICD-10-CM | POA: Diagnosis not present

## 2015-03-21 DIAGNOSIS — M3312 Other dermatopolymyositis with myopathy: Secondary | ICD-10-CM | POA: Diagnosis not present

## 2015-03-21 DIAGNOSIS — M150 Primary generalized (osteo)arthritis: Secondary | ICD-10-CM | POA: Diagnosis not present

## 2015-03-22 DIAGNOSIS — M1990 Unspecified osteoarthritis, unspecified site: Secondary | ICD-10-CM | POA: Diagnosis not present

## 2015-03-22 DIAGNOSIS — M6281 Muscle weakness (generalized): Secondary | ICD-10-CM | POA: Diagnosis not present

## 2015-03-22 DIAGNOSIS — Z96651 Presence of right artificial knee joint: Secondary | ICD-10-CM | POA: Diagnosis not present

## 2015-03-22 DIAGNOSIS — Z471 Aftercare following joint replacement surgery: Secondary | ICD-10-CM | POA: Diagnosis not present

## 2015-03-28 DIAGNOSIS — M6281 Muscle weakness (generalized): Secondary | ICD-10-CM | POA: Diagnosis not present

## 2015-03-28 DIAGNOSIS — Z471 Aftercare following joint replacement surgery: Secondary | ICD-10-CM | POA: Diagnosis not present

## 2015-03-28 DIAGNOSIS — M1990 Unspecified osteoarthritis, unspecified site: Secondary | ICD-10-CM | POA: Diagnosis not present

## 2015-03-28 DIAGNOSIS — Z96651 Presence of right artificial knee joint: Secondary | ICD-10-CM | POA: Diagnosis not present

## 2015-04-04 DIAGNOSIS — Z96651 Presence of right artificial knee joint: Secondary | ICD-10-CM | POA: Diagnosis not present

## 2015-04-04 DIAGNOSIS — Z471 Aftercare following joint replacement surgery: Secondary | ICD-10-CM | POA: Diagnosis not present

## 2015-04-04 DIAGNOSIS — M1990 Unspecified osteoarthritis, unspecified site: Secondary | ICD-10-CM | POA: Diagnosis not present

## 2015-04-04 DIAGNOSIS — M6281 Muscle weakness (generalized): Secondary | ICD-10-CM | POA: Diagnosis not present

## 2015-04-26 ENCOUNTER — Encounter: Payer: Self-pay | Admitting: Podiatry

## 2015-04-26 ENCOUNTER — Ambulatory Visit (INDEPENDENT_AMBULATORY_CARE_PROVIDER_SITE_OTHER): Payer: Medicare Other | Admitting: Podiatry

## 2015-04-26 DIAGNOSIS — M79676 Pain in unspecified toe(s): Secondary | ICD-10-CM

## 2015-04-26 DIAGNOSIS — Q828 Other specified congenital malformations of skin: Secondary | ICD-10-CM

## 2015-04-26 DIAGNOSIS — B351 Tinea unguium: Secondary | ICD-10-CM | POA: Diagnosis not present

## 2015-04-26 NOTE — Progress Notes (Signed)
Subjective:     [name], female   DOB: 27-Dec-1940, 74 y.o.   MRN: [id number]  HPI This patient presents to the office with long painful nails.  She also has callus on the outside left foot.  Patient has nerve and muscle damage to her legs and this requires her to ambulate using brace.  She says the nails are growing long and into corners of both big toes.  Review of Systems     Objective:   Physical Exam GENERAL APPEARANCE: Alert, conversant. Appropriately groomed. No acute distress.  VASCULAR: Pedal pulses palpable at  [facility] and PT bilateral.  Capillary refill time is immediate to all digits,  Normal temperature gradient.   NEUROLOGIC: sensation is normal to 5.07 monofilament at 5/5 sites bilateral.  Light touch is intact bilateral, Muscle strength normal.  MUSCULOSKELETAL: acceptable muscle strength, tone and stability bilateral.  Intrinsic muscluature intact bilateral.  Rectus appearance of foot and digits noted bilateral.   DERMATOLOGIC: skin color, texture, and turgor are within normal limits.  No preulcerative lesions or ulcers  are seen, no interdigital maceration noted.  No open lesions present.  There is callus sub fifth metabase left foot.. No drainage noted. NAILS  Thick disfigured discolored nails both hallux.      Assessment:     Onychomycosis Hallux B/L     Plan:     Debridement of nails.  Debridement of porokeratosis right foot.

## 2015-05-02 DIAGNOSIS — J4530 Mild persistent asthma, uncomplicated: Secondary | ICD-10-CM | POA: Diagnosis not present

## 2015-05-02 DIAGNOSIS — N3281 Overactive bladder: Secondary | ICD-10-CM | POA: Diagnosis not present

## 2015-05-02 DIAGNOSIS — I10 Essential (primary) hypertension: Secondary | ICD-10-CM | POA: Diagnosis not present

## 2015-05-02 DIAGNOSIS — E7800 Pure hypercholesterolemia, unspecified: Secondary | ICD-10-CM | POA: Diagnosis not present

## 2015-05-03 DIAGNOSIS — R1310 Dysphagia, unspecified: Secondary | ICD-10-CM | POA: Diagnosis not present

## 2015-05-03 DIAGNOSIS — K222 Esophageal obstruction: Secondary | ICD-10-CM | POA: Diagnosis not present

## 2015-05-03 DIAGNOSIS — Z7982 Long term (current) use of aspirin: Secondary | ICD-10-CM | POA: Diagnosis not present

## 2015-05-03 DIAGNOSIS — K229 Disease of esophagus, unspecified: Secondary | ICD-10-CM | POA: Diagnosis not present

## 2015-05-03 DIAGNOSIS — G8929 Other chronic pain: Secondary | ICD-10-CM | POA: Diagnosis not present

## 2015-05-03 DIAGNOSIS — I10 Essential (primary) hypertension: Secondary | ICD-10-CM | POA: Diagnosis not present

## 2015-05-03 DIAGNOSIS — K449 Diaphragmatic hernia without obstruction or gangrene: Secondary | ICD-10-CM | POA: Diagnosis not present

## 2015-05-03 DIAGNOSIS — K2970 Gastritis, unspecified, without bleeding: Secondary | ICD-10-CM | POA: Diagnosis not present

## 2015-05-03 DIAGNOSIS — G4733 Obstructive sleep apnea (adult) (pediatric): Secondary | ICD-10-CM | POA: Diagnosis not present

## 2015-05-03 DIAGNOSIS — J45909 Unspecified asthma, uncomplicated: Secondary | ICD-10-CM | POA: Diagnosis not present

## 2015-05-03 DIAGNOSIS — Z79899 Other long term (current) drug therapy: Secondary | ICD-10-CM | POA: Diagnosis not present

## 2015-05-03 DIAGNOSIS — E039 Hypothyroidism, unspecified: Secondary | ICD-10-CM | POA: Diagnosis not present

## 2015-05-03 DIAGNOSIS — R1013 Epigastric pain: Secondary | ICD-10-CM | POA: Diagnosis not present

## 2015-05-03 DIAGNOSIS — K208 Other esophagitis: Secondary | ICD-10-CM | POA: Diagnosis not present

## 2015-05-03 DIAGNOSIS — K2210 Ulcer of esophagus without bleeding: Secondary | ICD-10-CM | POA: Diagnosis not present

## 2015-05-03 DIAGNOSIS — E785 Hyperlipidemia, unspecified: Secondary | ICD-10-CM | POA: Diagnosis not present

## 2015-05-08 DIAGNOSIS — Z23 Encounter for immunization: Secondary | ICD-10-CM | POA: Diagnosis not present

## 2015-05-24 DIAGNOSIS — R1310 Dysphagia, unspecified: Secondary | ICD-10-CM | POA: Diagnosis not present

## 2015-05-24 DIAGNOSIS — R1013 Epigastric pain: Secondary | ICD-10-CM | POA: Diagnosis not present

## 2015-05-24 DIAGNOSIS — Z1211 Encounter for screening for malignant neoplasm of colon: Secondary | ICD-10-CM | POA: Diagnosis not present

## 2015-05-29 DIAGNOSIS — Z1211 Encounter for screening for malignant neoplasm of colon: Secondary | ICD-10-CM | POA: Diagnosis not present

## 2015-05-29 DIAGNOSIS — Z1212 Encounter for screening for malignant neoplasm of rectum: Secondary | ICD-10-CM | POA: Diagnosis not present

## 2015-06-07 DIAGNOSIS — Z96652 Presence of left artificial knee joint: Secondary | ICD-10-CM | POA: Diagnosis not present

## 2015-06-07 DIAGNOSIS — M3390 Dermatopolymyositis, unspecified, organ involvement unspecified: Secondary | ICD-10-CM | POA: Diagnosis not present

## 2015-06-07 DIAGNOSIS — J45909 Unspecified asthma, uncomplicated: Secondary | ICD-10-CM | POA: Diagnosis not present

## 2015-06-07 DIAGNOSIS — G5731 Lesion of lateral popliteal nerve, right lower limb: Secondary | ICD-10-CM | POA: Diagnosis not present

## 2015-06-21 DIAGNOSIS — M4806 Spinal stenosis, lumbar region: Secondary | ICD-10-CM | POA: Diagnosis not present

## 2015-06-21 DIAGNOSIS — E039 Hypothyroidism, unspecified: Secondary | ICD-10-CM | POA: Diagnosis not present

## 2015-06-21 DIAGNOSIS — M150 Primary generalized (osteo)arthritis: Secondary | ICD-10-CM | POA: Diagnosis not present

## 2015-06-21 DIAGNOSIS — M3312 Other dermatopolymyositis with myopathy: Secondary | ICD-10-CM | POA: Diagnosis not present

## 2015-07-26 ENCOUNTER — Ambulatory Visit: Payer: Medicare Other | Admitting: Sports Medicine

## 2015-07-27 ENCOUNTER — Ambulatory Visit: Payer: Medicare Other | Admitting: Sports Medicine

## 2015-08-03 ENCOUNTER — Ambulatory Visit: Payer: Medicare Other | Admitting: Sports Medicine

## 2015-08-09 ENCOUNTER — Encounter: Payer: Self-pay | Admitting: Sports Medicine

## 2015-08-09 ENCOUNTER — Ambulatory Visit (INDEPENDENT_AMBULATORY_CARE_PROVIDER_SITE_OTHER): Payer: Medicare Other | Admitting: Sports Medicine

## 2015-08-09 DIAGNOSIS — B351 Tinea unguium: Secondary | ICD-10-CM | POA: Diagnosis not present

## 2015-08-09 DIAGNOSIS — M79676 Pain in unspecified toe(s): Secondary | ICD-10-CM | POA: Diagnosis not present

## 2015-08-09 DIAGNOSIS — G588 Other specified mononeuropathies: Secondary | ICD-10-CM

## 2015-08-09 DIAGNOSIS — Z872 Personal history of diseases of the skin and subcutaneous tissue: Secondary | ICD-10-CM

## 2015-08-09 NOTE — Progress Notes (Signed)
Patient ID: JIONNI KOLM, female   DOB: 25-Apr-1941, 75 y.o.   MRN: [id number] Subjective: Janice [name] is a 75 y.o. female patient seen today in office with complaint of painful thickened and elongated toenails; unable to trim. Patient denies history of Diabetes or Vascular disease.  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 Patient has no other pedal complaints at this time.   Patient Active Problem List   Diagnosis Date Noted  . 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 Symptomatic cholelithiasis 03/25/2014   Current Outpatient Prescriptions on File Prior to Visit  Medication Sig Dispense Refill  . Albuterol Sulfate (VENTOLIN HFA IN) Inhale 2 puffs into the lungs every 4 (four) hours as needed (shortness of breath).     . Alpha-Lipoic Acid 300 MG CAPS Take 300 mg by mouth daily.    Marland Kitchen aspirin 81 MG tablet Take 81 mg by mouth daily.    Marland Kitchen BENICAR 40 MG tablet Take 40 mg by mouth daily with breakfast.     . BIOTIN PO Take 1 tablet by mouth daily.    Marland Kitchen CALCIUM PO Take 500 mg by mouth daily.     . Cholecalciferol (VITAMIN D) 2000 UNITS CAPS Take 2,000 Units by mouth 2 (two) times daily.    . fesoterodine (TOVIAZ) 8 MG TB24 tablet Take 8 mg by mouth daily.    . fluticasone (FLONASE) 50 MCG/ACT nasal spray Place 1 spray into both nostrils daily as needed for allergies or rhinitis.    . folic acid (FOLVITE) 1 MG tablet Take 1 mg by mouth daily.    Marland Kitchen guaiFENesin (MUCINEX) 600 MG 12 hr tablet Take 600 mg by mouth as needed (congestion).     Marland Kitchen MAGNESIUM PO Take 1 tablet by mouth daily as needed (500mg . each, muscle cramps, takes up to 3 tabs  (total) as needed).     . methotrexate (RHEUMATREX) 2.5 MG tablet Take 10 mg by mouth once a week. On Fridays    . montelukast (SINGULAIR) 10 MG tablet Take 10 mg by mouth daily.     . naproxen sodium (ANAPROX) 220 MG tablet Take 220 mg by mouth 3 (three) times daily as needed  (pain).     . SYNTHROID 112 MCG tablet Take 112 mcg by mouth daily before breakfast.   11   No current facility-administered medications on file prior to visit.   Allergies  Allergen Reactions  . Prednisone Anaphylaxis    psychosis  . Amlodipine Besylate Other (See Comments)    Muscle weakness  . Avapro [Irbesartan] Other (See Comments)    Muscle weakness, fatigue -- States she can only take Benicar  . Ciprofloxacin Other (See Comments)    Confusion w/psychosis  . Furosemide Other (See Comments)    Muscle weakness  . Lipitor [Atorvastatin] Other (See Comments)    Muscle weakness  . Myrbetriq [Mirabegron] Other (See Comments)    Blurred vision   . Neurontin [Gabapentin] Other (See Comments)    Muscle jerking  . Novocain [Procaine]     Does not work  . Penicillins Hives  . Ranitidine     Pt. Knows that it can cause: Confusion & agitation, she is not  willing to take the risk of taking this med.   . Sulfa Antibiotics Other (See Comments)    swelling  . Tizanidine     Confusion   . Tramadol Other (See Comments)  Confusion w/psychosis  . Welchol [Colesevelam Hcl]     Objective: Physical Exam  General: Well developed, nourished, no acute distress, awake, alert and oriented x 3  Vascular: Dorsalis pedis artery 2/4 bilateral, Posterior tibial artery 1/4 bilateral, skin temperature warm to warm proximal to distal bilateral lower extremities, + varicosities, decreased pedal hair present bilateral.  No edema, however, large Girth with overlapping skin and fat to both lower extremities that is non-painful and exhibits no pitting edema suggestive of possible chronic lymphedema versus chronic changes of skin Due to dermatomyositis.   Neurological: Gross sensation present via light touch bilateral.  Protective sensation intact bilateral, however, vibratory diminished  Episodes of increased sensitivity/hypersensitive to touch.   Dermatological: Skin is warm, dry, and supple  bilateral, Nails 1-10 are tender, long, thick, and discolored with mild subungal debris, no webspace macerations present bilateral, no open lesions present bilateral, no callus/corns/hyperkeratotic tissue present bilateral;  Previous sub fifth metatarsal base callus appears now resolved left foot. No signs of infection bilateral.  Musculoskeletal:  Mild asymptomatic hammertoe deformities noted bilateral. Muscular strength within normal limits without major pain or limitation on range of motion Except on right. There is mild weakness 4-5 compared to 5, 5 on left. No pain with calf compression bilateral.  Assessment and Plan:  Problem List Items Addressed This Visit    None    Visit Diagnoses    Pain due to onychomycosis of toenail    -  Primary    Other mononeuropathy        H/O dermatomyositis           -Examined patient.  -Discussed treatment options for painful mycotic nails. -Mechanically debrided and reduced mycotic nails with sterile nail nipper and dremel nail file without incident.  Patient prefers [place] to be gentle with nail trims. - Continue with AFO on right and good supportive shoes daily -Patient to return in 3 months for follow up evaluation or sooner if symptoms worsen.  Landis Martins, DPM

## 2015-09-04 ENCOUNTER — Other Ambulatory Visit: Payer: Self-pay | Admitting: Endocrinology

## 2015-09-04 DIAGNOSIS — E049 Nontoxic goiter, unspecified: Secondary | ICD-10-CM

## 2015-09-06 DIAGNOSIS — E039 Hypothyroidism, unspecified: Secondary | ICD-10-CM | POA: Diagnosis not present

## 2015-09-09 IMAGING — US US SOFT TISSUE HEAD/NECK
1 series · 14 of 25 positions shown · non-contrast
Comparison: 08/31/2012 and earlier studies

CLINICAL DATA: Goiter. Previous FNA of left lesions 01/24/2010,
05/08/2004

EXAM:
THYROID ULTRASOUND
TECHNIQUE: Ultrasound examination of the thyroid gland and adjacent soft
tissues was performed.

[Series 1: us soft tissue head/neck · 0.11mm/px · 14 of 47 slices shown]
[im 1/47]
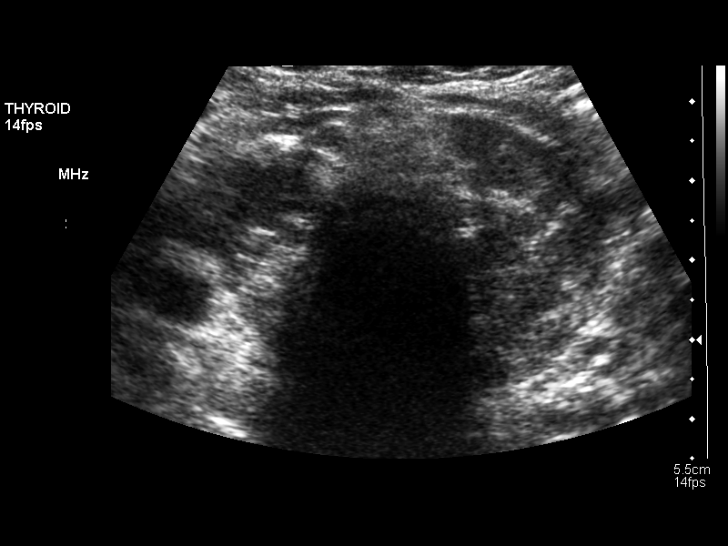
[im 4/47]
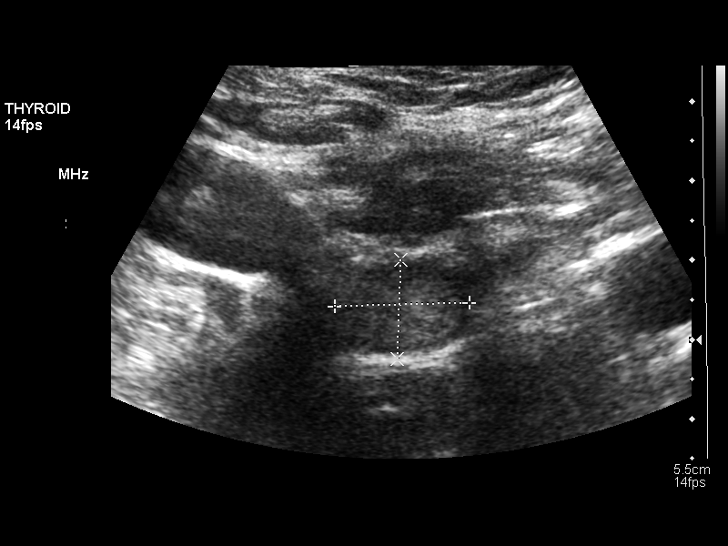
[im 8/47]
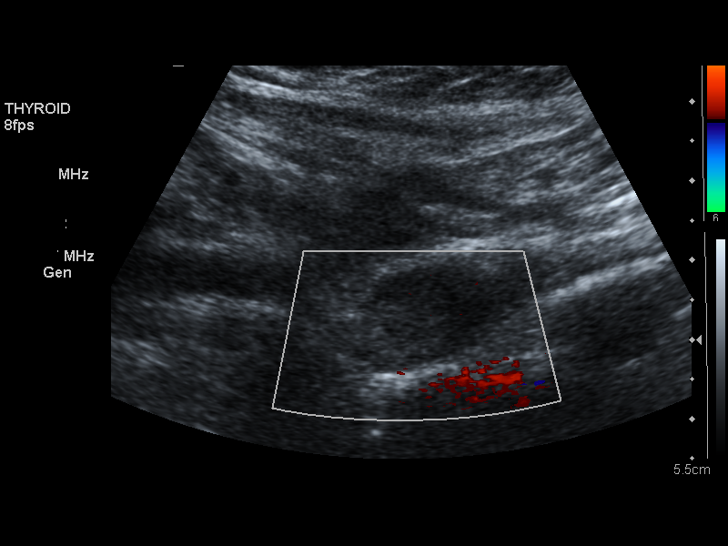
[im 12/47]
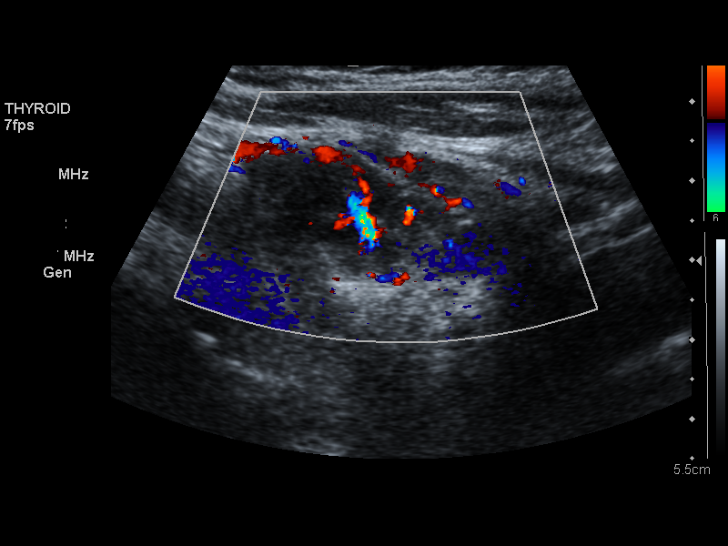
[im 16/47]
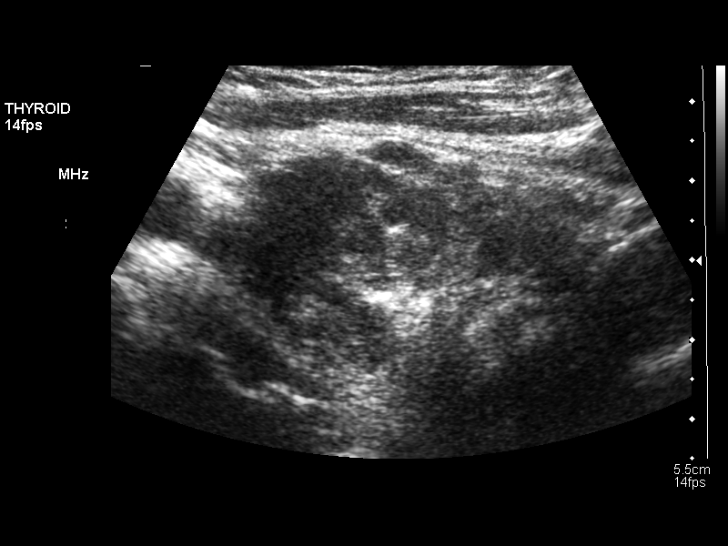
[im 18/47]
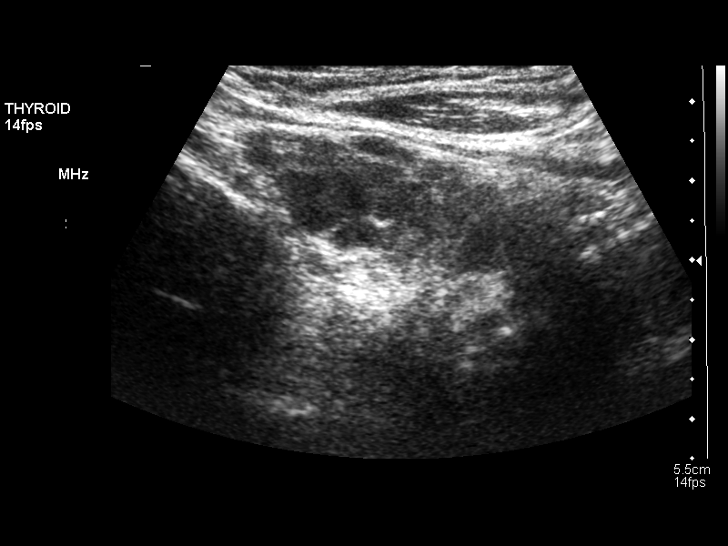
[im 22/47]
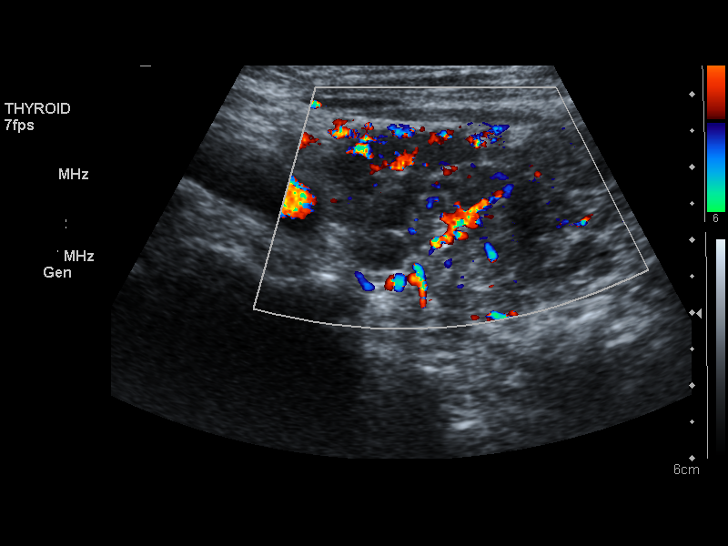
[im 25/47]
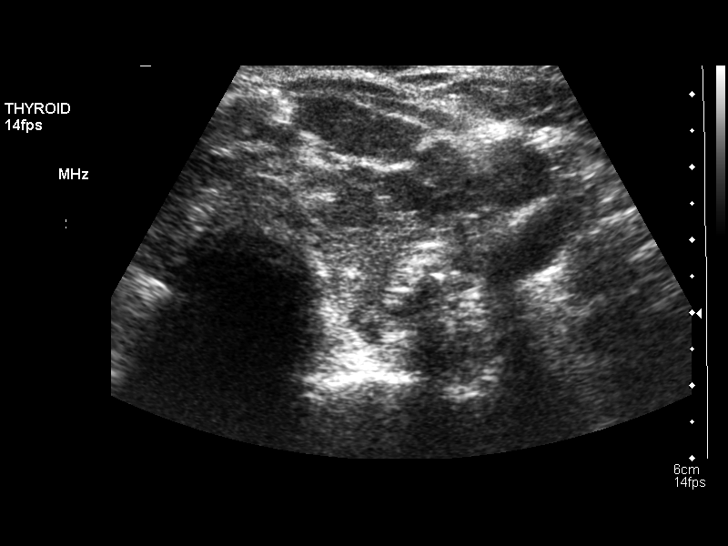
[im 29/47]
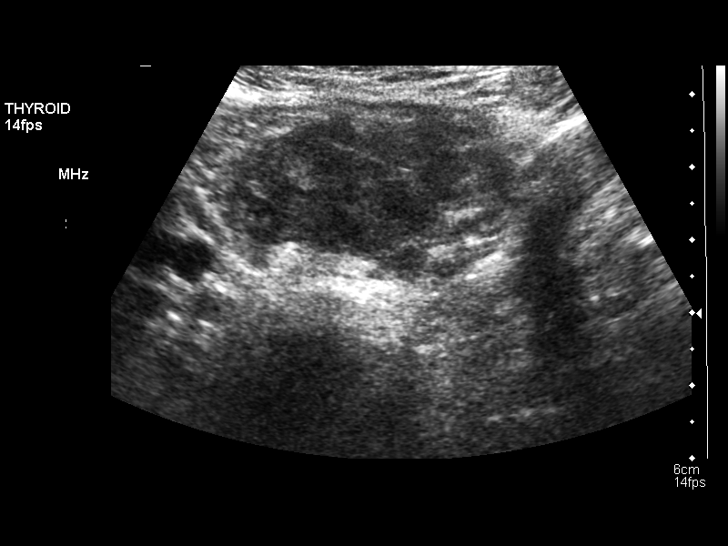
[im 31/47]
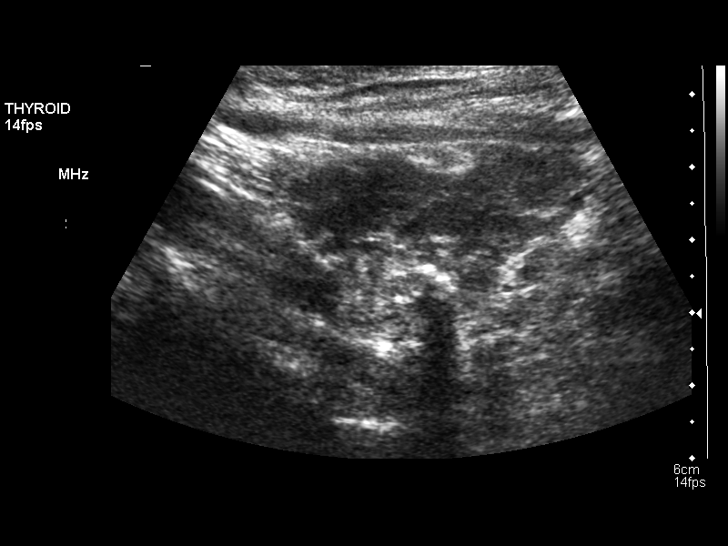
[im 35/47]
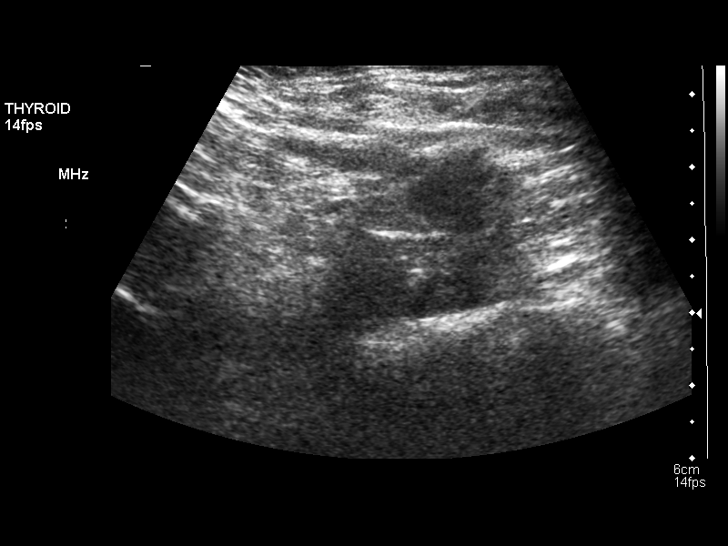
[im 39/47]
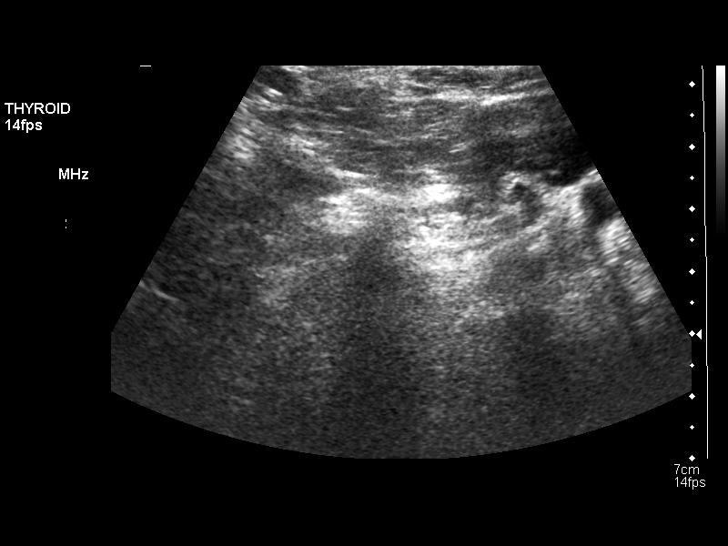
[im 43/47]
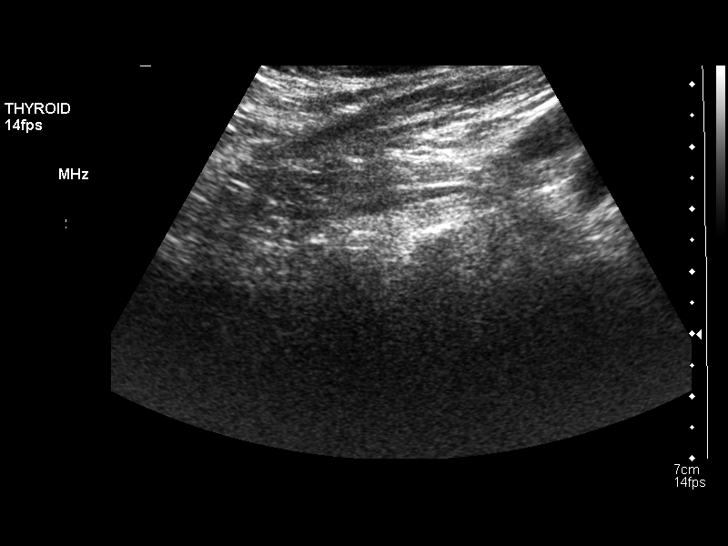
[im 47/47]
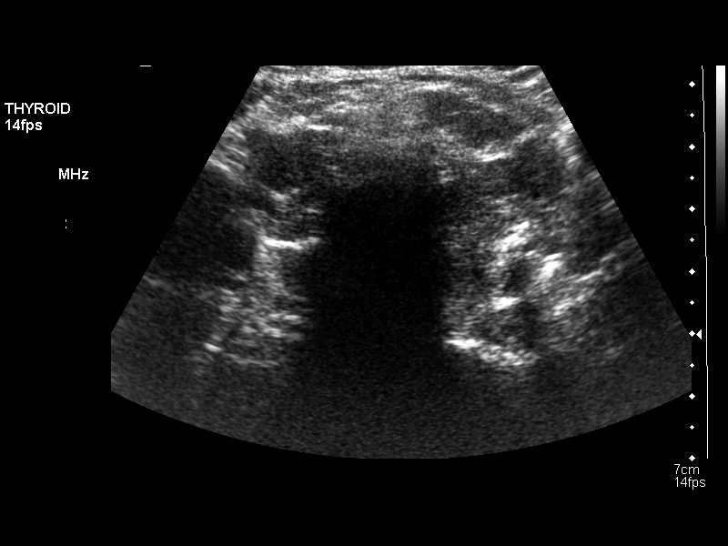

[14 of 25 positions shown; findings below may reference images not displayed]

FINDINGS: Right thyroid lobe

Measurements: 44 x 17 x 24 mm. Markedly heterogeneous echotexture.
17 x 13 x 19 mm medial nodule, inferior to the isthmus (previously
22 x 12 x 20).

Left thyroid lobe

Measurements: 46 x 28 x 30 mm. Lobular inhomogeneous echotexture.
There is an 11 x 13 mm poorly marginated region containing coarse
calcifications, mid lobe (previously 13 mm).

Isthmus

Thickness:  14 mm.  No nodules visualized.

Lymphadenopathy

None visualized.
IMPRESSION: Heterogeneous thyroid with slight decrease in dominant bilateral
nodules. Correlate with previous biopsy results.

## 2015-09-18 ENCOUNTER — Other Ambulatory Visit: Payer: PRIVATE HEALTH INSURANCE

## 2015-09-23 DIAGNOSIS — M79604 Pain in right leg: Secondary | ICD-10-CM | POA: Diagnosis not present

## 2015-10-09 DIAGNOSIS — E039 Hypothyroidism, unspecified: Secondary | ICD-10-CM | POA: Diagnosis not present

## 2015-10-09 DIAGNOSIS — M3312 Other dermatopolymyositis with myopathy: Secondary | ICD-10-CM | POA: Diagnosis not present

## 2015-10-09 DIAGNOSIS — M150 Primary generalized (osteo)arthritis: Secondary | ICD-10-CM | POA: Diagnosis not present

## 2015-10-09 DIAGNOSIS — M4806 Spinal stenosis, lumbar region: Secondary | ICD-10-CM | POA: Diagnosis not present

## 2015-10-16 ENCOUNTER — Ambulatory Visit
Admission: RE | Admit: 2015-10-16 | Discharge: 2015-10-16 | Disposition: A | Discharge status: Home / Self Care | Payer: Medicare Other | Source: Ambulatory Visit | Attending: Endocrinology | Admitting: Endocrinology

## 2015-10-16 DIAGNOSIS — E049 Nontoxic goiter, unspecified: Secondary | ICD-10-CM

## 2015-10-16 DIAGNOSIS — E042 Nontoxic multinodular goiter: Secondary | ICD-10-CM | POA: Diagnosis not present

## 2015-10-18 ENCOUNTER — Other Ambulatory Visit: Payer: Self-pay | Admitting: Endocrinology

## 2015-10-18 DIAGNOSIS — E039 Hypothyroidism, unspecified: Secondary | ICD-10-CM | POA: Diagnosis not present

## 2015-10-18 DIAGNOSIS — E049 Nontoxic goiter, unspecified: Secondary | ICD-10-CM

## 2015-10-23 DIAGNOSIS — I10 Essential (primary) hypertension: Secondary | ICD-10-CM | POA: Diagnosis not present

## 2015-10-23 DIAGNOSIS — M5410 Radiculopathy, site unspecified: Secondary | ICD-10-CM | POA: Diagnosis not present

## 2015-10-23 DIAGNOSIS — G5731 Lesion of lateral popliteal nerve, right lower limb: Secondary | ICD-10-CM | POA: Diagnosis not present

## 2015-10-23 DIAGNOSIS — M542 Cervicalgia: Secondary | ICD-10-CM | POA: Diagnosis not present

## 2015-10-23 DIAGNOSIS — Z96651 Presence of right artificial knee joint: Secondary | ICD-10-CM | POA: Diagnosis not present

## 2015-10-23 DIAGNOSIS — J45909 Unspecified asthma, uncomplicated: Secondary | ICD-10-CM | POA: Diagnosis not present

## 2015-10-23 DIAGNOSIS — E669 Obesity, unspecified: Secondary | ICD-10-CM | POA: Diagnosis not present

## 2015-10-23 DIAGNOSIS — M79601 Pain in right arm: Secondary | ICD-10-CM | POA: Diagnosis not present

## 2015-10-23 DIAGNOSIS — M3390 Dermatopolymyositis, unspecified, organ involvement unspecified: Secondary | ICD-10-CM | POA: Diagnosis not present

## 2015-10-23 DIAGNOSIS — Z7982 Long term (current) use of aspirin: Secondary | ICD-10-CM | POA: Diagnosis not present

## 2015-10-30 DIAGNOSIS — M3312 Other dermatopolymyositis with myopathy: Secondary | ICD-10-CM | POA: Diagnosis not present

## 2015-10-30 DIAGNOSIS — M6281 Muscle weakness (generalized): Secondary | ICD-10-CM | POA: Diagnosis not present

## 2015-10-31 DIAGNOSIS — J4530 Mild persistent asthma, uncomplicated: Secondary | ICD-10-CM | POA: Diagnosis not present

## 2015-10-31 DIAGNOSIS — E7800 Pure hypercholesterolemia, unspecified: Secondary | ICD-10-CM | POA: Diagnosis not present

## 2015-10-31 DIAGNOSIS — E039 Hypothyroidism, unspecified: Secondary | ICD-10-CM | POA: Diagnosis not present

## 2015-10-31 DIAGNOSIS — M3392 Dermatopolymyositis, unspecified with myopathy: Secondary | ICD-10-CM | POA: Diagnosis not present

## 2015-10-31 DIAGNOSIS — G500 Trigeminal neuralgia: Secondary | ICD-10-CM | POA: Diagnosis not present

## 2015-10-31 DIAGNOSIS — I10 Essential (primary) hypertension: Secondary | ICD-10-CM | POA: Diagnosis not present

## 2015-11-07 DIAGNOSIS — M50122 Cervical disc disorder at C5-C6 level with radiculopathy: Secondary | ICD-10-CM | POA: Diagnosis not present

## 2015-11-07 DIAGNOSIS — M9981 Other biomechanical lesions of cervical region: Secondary | ICD-10-CM | POA: Diagnosis not present

## 2015-11-07 DIAGNOSIS — M2578 Osteophyte, vertebrae: Secondary | ICD-10-CM | POA: Diagnosis not present

## 2015-11-07 DIAGNOSIS — M5410 Radiculopathy, site unspecified: Secondary | ICD-10-CM | POA: Diagnosis not present

## 2015-11-08 ENCOUNTER — Ambulatory Visit: Payer: Medicare Other | Admitting: Sports Medicine

## 2015-11-13 DIAGNOSIS — M3312 Other dermatopolymyositis with myopathy: Secondary | ICD-10-CM | POA: Diagnosis not present

## 2015-11-13 DIAGNOSIS — M6281 Muscle weakness (generalized): Secondary | ICD-10-CM | POA: Diagnosis not present

## 2015-11-15 DIAGNOSIS — M3312 Other dermatopolymyositis with myopathy: Secondary | ICD-10-CM | POA: Diagnosis not present

## 2015-11-15 DIAGNOSIS — M6281 Muscle weakness (generalized): Secondary | ICD-10-CM | POA: Diagnosis not present

## 2015-11-20 DIAGNOSIS — M6281 Muscle weakness (generalized): Secondary | ICD-10-CM | POA: Diagnosis not present

## 2015-11-20 DIAGNOSIS — M3312 Other dermatopolymyositis with myopathy: Secondary | ICD-10-CM | POA: Diagnosis not present

## 2015-11-27 ENCOUNTER — Encounter: Payer: Self-pay | Admitting: Podiatry

## 2015-11-27 ENCOUNTER — Ambulatory Visit (INDEPENDENT_AMBULATORY_CARE_PROVIDER_SITE_OTHER): Payer: Medicare Other | Admitting: Podiatry

## 2015-11-27 DIAGNOSIS — M3312 Other dermatopolymyositis with myopathy: Secondary | ICD-10-CM | POA: Diagnosis not present

## 2015-11-27 DIAGNOSIS — Q828 Other specified congenital malformations of skin: Secondary | ICD-10-CM | POA: Diagnosis not present

## 2015-11-27 DIAGNOSIS — M79676 Pain in unspecified toe(s): Secondary | ICD-10-CM

## 2015-11-27 DIAGNOSIS — M6281 Muscle weakness (generalized): Secondary | ICD-10-CM | POA: Diagnosis not present

## 2015-11-27 DIAGNOSIS — B351 Tinea unguium: Secondary | ICD-10-CM

## 2015-11-27 NOTE — Progress Notes (Signed)
Subjective:     [name], female   DOB: 06/19/1941, 75 y.o.   MRN: [id number]  HPI This patient presents to the office with long painful nails.  She also has callus on the outside left foot.  Patient has nerve and muscle damage to her legs and this requires her to ambulate using brace.  She says the nails are growing long and into corners of both big toes.  Review of Systems     Objective:   Physical Exam GENERAL APPEARANCE: Alert, conversant. Appropriately groomed. No acute distress.  VASCULAR: Pedal pulses palpable at  [facility] and PT bilateral.  Capillary refill time is immediate to all digits,  Normal temperature gradient.   NEUROLOGIC: sensation is normal to 5.07 monofilament at 5/5 sites bilateral.  Light touch is intact bilateral, Muscle strength normal.  MUSCULOSKELETAL: acceptable muscle strength, tone and stability bilateral.  Intrinsic muscluature intact bilateral.  Rectus appearance of foot and digits noted bilateral.   DERMATOLOGIC: skin color, texture, and turgor are within normal limits.  No preulcerative lesions or ulcers  are seen, no interdigital maceration noted.  No open lesions present.  There is callus sub fifth metabase left foot.. No drainage noted. NAILS  Thick disfigured discolored nails both hallux.      Assessment:     Onychomycosis Hallux B/L.  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     Plan:     Debridement of nails.  Debridement of porokeratosis right foot.    Gardiner Barefoot DPM

## 2015-11-30 ENCOUNTER — Other Ambulatory Visit: Payer: Self-pay | Admitting: Endocrinology

## 2015-11-30 DIAGNOSIS — M3312 Other dermatopolymyositis with myopathy: Secondary | ICD-10-CM | POA: Diagnosis not present

## 2015-11-30 DIAGNOSIS — E010 Iodine-deficiency related diffuse (endemic) goiter: Secondary | ICD-10-CM

## 2015-11-30 DIAGNOSIS — M6281 Muscle weakness (generalized): Secondary | ICD-10-CM | POA: Diagnosis not present

## 2015-12-11 DIAGNOSIS — M3312 Other dermatopolymyositis with myopathy: Secondary | ICD-10-CM | POA: Diagnosis not present

## 2015-12-11 DIAGNOSIS — M6281 Muscle weakness (generalized): Secondary | ICD-10-CM | POA: Diagnosis not present

## 2015-12-14 DIAGNOSIS — M3312 Other dermatopolymyositis with myopathy: Secondary | ICD-10-CM | POA: Diagnosis not present

## 2015-12-14 DIAGNOSIS — M6281 Muscle weakness (generalized): Secondary | ICD-10-CM | POA: Diagnosis not present

## 2015-12-18 DIAGNOSIS — M3312 Other dermatopolymyositis with myopathy: Secondary | ICD-10-CM | POA: Diagnosis not present

## 2015-12-18 DIAGNOSIS — M6281 Muscle weakness (generalized): Secondary | ICD-10-CM | POA: Diagnosis not present

## 2015-12-21 DIAGNOSIS — M6281 Muscle weakness (generalized): Secondary | ICD-10-CM | POA: Diagnosis not present

## 2015-12-21 DIAGNOSIS — M3312 Other dermatopolymyositis with myopathy: Secondary | ICD-10-CM | POA: Diagnosis not present

## 2015-12-28 DIAGNOSIS — M6281 Muscle weakness (generalized): Secondary | ICD-10-CM | POA: Diagnosis not present

## 2015-12-28 DIAGNOSIS — M3312 Other dermatopolymyositis with myopathy: Secondary | ICD-10-CM | POA: Diagnosis not present

## 2016-01-02 DIAGNOSIS — M6281 Muscle weakness (generalized): Secondary | ICD-10-CM | POA: Diagnosis not present

## 2016-01-02 DIAGNOSIS — M3312 Other dermatopolymyositis with myopathy: Secondary | ICD-10-CM | POA: Diagnosis not present

## 2016-01-04 ENCOUNTER — Other Ambulatory Visit: Payer: Self-pay | Admitting: Endocrinology

## 2016-01-04 ENCOUNTER — Ambulatory Visit
Admission: RE | Admit: 2016-01-04 | Discharge: 2016-01-04 | Disposition: A | Discharge status: Home / Self Care | Payer: Medicare Other | Source: Ambulatory Visit | Attending: Endocrinology | Admitting: Endocrinology

## 2016-01-04 DIAGNOSIS — E041 Nontoxic single thyroid nodule: Secondary | ICD-10-CM | POA: Diagnosis not present

## 2016-01-04 DIAGNOSIS — E010 Iodine-deficiency related diffuse (endemic) goiter: Secondary | ICD-10-CM

## 2016-01-08 DIAGNOSIS — M3312 Other dermatopolymyositis with myopathy: Secondary | ICD-10-CM | POA: Diagnosis not present

## 2016-01-08 DIAGNOSIS — E039 Hypothyroidism, unspecified: Secondary | ICD-10-CM | POA: Diagnosis not present

## 2016-01-08 DIAGNOSIS — M150 Primary generalized (osteo)arthritis: Secondary | ICD-10-CM | POA: Diagnosis not present

## 2016-01-08 DIAGNOSIS — M4806 Spinal stenosis, lumbar region: Secondary | ICD-10-CM | POA: Diagnosis not present

## 2016-01-11 DIAGNOSIS — M6281 Muscle weakness (generalized): Secondary | ICD-10-CM | POA: Diagnosis not present

## 2016-01-11 DIAGNOSIS — M3312 Other dermatopolymyositis with myopathy: Secondary | ICD-10-CM | POA: Diagnosis not present

## 2016-01-18 DIAGNOSIS — M3312 Other dermatopolymyositis with myopathy: Secondary | ICD-10-CM | POA: Diagnosis not present

## 2016-01-18 DIAGNOSIS — M6281 Muscle weakness (generalized): Secondary | ICD-10-CM | POA: Diagnosis not present

## 2016-01-24 DIAGNOSIS — S93401A Sprain of unspecified ligament of right ankle, initial encounter: Secondary | ICD-10-CM | POA: Diagnosis not present

## 2016-01-24 DIAGNOSIS — M79671 Pain in right foot: Secondary | ICD-10-CM | POA: Diagnosis not present

## 2016-01-24 DIAGNOSIS — M7989 Other specified soft tissue disorders: Secondary | ICD-10-CM | POA: Diagnosis not present

## 2016-01-24 DIAGNOSIS — S93601A Unspecified sprain of right foot, initial encounter: Secondary | ICD-10-CM | POA: Diagnosis not present

## 2016-01-24 DIAGNOSIS — R600 Localized edema: Secondary | ICD-10-CM | POA: Diagnosis not present

## 2016-01-24 DIAGNOSIS — M79604 Pain in right leg: Secondary | ICD-10-CM | POA: Diagnosis not present

## 2016-01-24 DIAGNOSIS — S76911A Strain of unspecified muscles, fascia and tendons at thigh level, right thigh, initial encounter: Secondary | ICD-10-CM | POA: Diagnosis not present

## 2016-02-08 DIAGNOSIS — M6281 Muscle weakness (generalized): Secondary | ICD-10-CM | POA: Diagnosis not present

## 2016-02-08 DIAGNOSIS — M3312 Other dermatopolymyositis with myopathy: Secondary | ICD-10-CM | POA: Diagnosis not present

## 2016-02-15 DIAGNOSIS — M3312 Other dermatopolymyositis with myopathy: Secondary | ICD-10-CM | POA: Diagnosis not present

## 2016-02-15 DIAGNOSIS — M6281 Muscle weakness (generalized): Secondary | ICD-10-CM | POA: Diagnosis not present

## 2016-02-19 DIAGNOSIS — G5731 Lesion of lateral popliteal nerve, right lower limb: Secondary | ICD-10-CM | POA: Diagnosis not present

## 2016-02-19 DIAGNOSIS — Z7982 Long term (current) use of aspirin: Secondary | ICD-10-CM | POA: Diagnosis not present

## 2016-02-19 DIAGNOSIS — I10 Essential (primary) hypertension: Secondary | ICD-10-CM | POA: Diagnosis not present

## 2016-02-19 DIAGNOSIS — Z96652 Presence of left artificial knee joint: Secondary | ICD-10-CM | POA: Diagnosis not present

## 2016-02-19 DIAGNOSIS — E669 Obesity, unspecified: Secondary | ICD-10-CM | POA: Diagnosis not present

## 2016-02-19 DIAGNOSIS — Z79899 Other long term (current) drug therapy: Secondary | ICD-10-CM | POA: Diagnosis not present

## 2016-02-19 DIAGNOSIS — M3390 Dermatopolymyositis, unspecified, organ involvement unspecified: Secondary | ICD-10-CM | POA: Diagnosis not present

## 2016-02-19 DIAGNOSIS — J45909 Unspecified asthma, uncomplicated: Secondary | ICD-10-CM | POA: Diagnosis not present

## 2016-02-26 ENCOUNTER — Ambulatory Visit: Payer: Medicare Other | Admitting: Podiatry

## 2016-02-26 DIAGNOSIS — I87323 Chronic venous hypertension (idiopathic) with inflammation of bilateral lower extremity: Secondary | ICD-10-CM | POA: Diagnosis not present

## 2016-02-28 ENCOUNTER — Ambulatory Visit: Payer: Medicare Other | Attending: Orthopedic Surgery | Admitting: Physical Therapy

## 2016-02-28 DIAGNOSIS — I890 Lymphedema, not elsewhere classified: Secondary | ICD-10-CM | POA: Diagnosis not present

## 2016-02-28 DIAGNOSIS — R2689 Other abnormalities of gait and mobility: Secondary | ICD-10-CM | POA: Insufficient documentation

## 2016-02-29 NOTE — Therapy (Signed)
[place], [place], [address] Phone: [phone]   Fax:  [phone]  Physical Therapy Evaluation  Patient Details  Name: Janice [name] MRN: [id number] Date of Birth: 1940/10/04 Referring Provider: Dr. Meridee Score  Encounter Date: 02/28/2016      PT End of Session - 02/29/16 0840    Visit Number 1   Number of Visits 2   Date for PT Re-Evaluation 04/27/16   PT Start Time 1435   PT Stop Time 1533   PT Time Calculation (min) 58 min   Activity Tolerance Patient tolerated treatment well   Behavior During Therapy [facility] for tasks assessed/performed      Past Medical History:  Diagnosis Date  . Anemia    rec'd her own blood in the past postop x2   . Arthritis    OSA- knees & shoulders   . Asthma   . 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 Family history of anesthesia complication    SISTER WAKES UP FIGHTING "  . Fibromyalgia    perhaps, not sure    . GERD (gastroesophageal reflux disease)   . Headache    migraine history  . Heart murmur    echo 09-very mild  . Hx of appendectomy   . Hypertension, benign   . Hypothyroidism   . Muscular weakness   . Sciatic pain   . Sleep apnea    sleep study done /w Dr. Maxwell Caul, uses CPAP q night   . Spinal stenosis   . Stenosis of right carotid artery   . Trigeminal neuralgia   . Wears glasses     Past Surgical History:  Procedure Laterality Date  . APPENDECTOMY    . CHOLECYSTECTOMY  04/12/2014   DR BLACMAN  . CHOLECYSTECTOMY N/A 04/12/2014   Procedure: LAPAROSCOPIC CHOLECYSTECTOMY;  Surgeon: Coralie Keens, MD;  Location: [place];  Service: General;  Laterality: N/A;  . EYE SURGERY     cataracts removed, /w IOL - bilateral   . LUMBAR FUSION  2004  . SHOULDER SURGERY  2000   right  . TONSILLECTOMY    . TOTAL HIP ARTHROPLASTY  2005   right  . TOTAL KNEE ARTHROPLASTY  2001   right  . TOTAL KNEE ARTHROPLASTY Left  02/01/2015  . TOTAL KNEE ARTHROPLASTY Left 02/01/2015   Procedure: LEFT TOTAL KNEE ARTHROPLASTY;  Surgeon: Newt Minion, MD;  Location: [place];  Service: Orthopedics;  Laterality: Left;    There were no vitals filed for this visit.       Subjective Assessment - 02/28/16 1438    Subjective Diagnosed a couple years ago with dermatomyositis; in 2005 had a nerve injury during a total hip replacement surgery and has weakness in right leg.  Four episodes in last year of right leg swelling.  Leg became suddenly swollen and very painful; last episode was in late July. Until two weeks ago she couldn't put pressure on the foot due to pain.  Has had ultrasound that ruled out DVT.  Dermatomyositis makes muscles painful so that she can't tolerate pressure on the muscles, including bandaging, which she reports was tried at Dr. Jess Barters office.  She has a Circaid calf garment that she only wears at night.  Recently spent three weeks keeping leg elevated to enable her to step on her foot and get her shoe back on.     Pertinent History See subjective note above for some history.  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  h/o asthma, now well controlled.  h/o bilateral TKAs (right >10 years ago, left last summer) and spinal stenosis surgery (fusion).   Patient Stated Goals get swelling and the pain that comes with it in the leg to reduce so she can live her life.   Currently in Pain? Yes   Pain Score 4    Pain Location Leg  lower leg and foot   Pain Orientation Right   Pain Descriptors / Indicators Other (Comment)  uncomfortable   Aggravating Factors  when swelling is greater, bearing weight on the right foot   Pain Relieving Factors elevating leg for a long time            [facility] PT Assessment - 02/29/16 0001      Assessment   Medical Diagnosis right LE lymphedema, dermatomyositis, h/o right THA with sciatic nerve injury    Referring  Provider Dr. Meridee Score     Precautions   Precautions Fall   Required Braces or Orthoses Other Brace/Splint  AFO     Balance Screen   Has the patient fallen in the past 6 months No   Has the patient had a decrease in activity level because of a fear of falling?  --  is very careful, not fearful   Is the patient reluctant to leave their home because of a fear of falling?  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 THA and bilat. TKAs)     Strength   Overall Strength Comments not formally assessed today, since patient is receiving therapy elsewhere, but functional weakness seen in her inability to stand from a standard height chair without significant difficulty, and in her need for an AFO on right     Ambulation/Gait   Ambulation/Gait Yes   Ambulation/Gait Assistance 6: Modified independent (Device/Increase time)   Assistive device Rolling walker  also an AFO for right--bilateral stays and inside shoe piece   Gait Comments has a wheelchair that she uses just to sit in; it is lightweight           LYMPHEDEMA/ONCOLOGY QUESTIONNAIRE - 02/29/16 0838      Type   Cancer Type none     What other symptoms do you have   Are you having pitting edema Yes     Lymphedema Assessments   Lymphedema Assessments Lower extremities     Right Lower Extremity Lymphedema   10 cm Proximal to Suprapatella 69.1 cm   30 cm Proximal to Floor at  Lateral Plantar Foot 53.2 cm   20 cm Proximal to Floor at Lateral Plantar Foot 45.6 1   10  cm Proximal to Floor at Lateral Malleoli 34.2 cm   5 cm Proximal to 1st MTP Joint 25.5 cm  Across MTP Joint 23.2 cm   Around Proximal Great Toe 8.3 cm     Left Lower Extremity Lymphedema   10 cm Proximal to Suprapatella 64.3 cm   30 cm Proximal to Floor at Lateral Plantar Foot 45.9 cm   20 cm Proximal to Floor at Lateral Plantar Foot 37.7 cm   10 cm Proximal to Floor at Lateral Malleoli 29.5 cm   5 cm Proximal to 1st MTP Joint 24.5 cm   Across MTP Joint 23.4 cm   Around Proximal Great Toe 7.4 cm                        PT Education - 02/29/16 0839    Education provided Yes   Education Details about Flexitouch pump as an option that may help her and be tolerable, whereas compression is too uncomfortable for her   Person(s) Educated Patient   Methods Explanation;Handout   Comprehension Verbalized understanding                [place] - 02/29/16 [phone]      CC Long Term Goal  #1   Title Patient will have had a trial/demonstration of a Flexitouch pump and will at least have the process started for obtaining that if it is beneficial.   Baseline 4   Period Weeks   Status New     CC Long Term Goal  #2   Title Patient will have obtained her replacement Circaid garments for nighttime leg compression.   Baseline She has a prescription and plans to get these through a local vendor.   Time 4   Period Weeks   Status New            Plan - 02/29/16 0841    Clinical Impression Statement Patient with complex medical history including her current chief complaint, right > left LE swelling, with treatment for that complicated by pain from dermatomyositis.  She has right LE weakness from a nerve injury during total hip replacement surgery; she has also had bilateral TKAs.  She uses an AFO for right foot clearance, ambulates with a rolling walker, and has a  lightweight wheelchair to enable her to sit and stand more easily than she can from average chairs.  She has had a trial of bandaging at Dr. Jess Barters office for compression of right LE swelling, and reports she could not tolerate it due to pain from dermatomyositis.  I did mention to her that one factor in that MAY be that the bandages they used MAY not have been short stretch bandages, which should be more comfortable that Ace wraps.  In any case, we discussed the availability of lymphedema pumps and one in particular, the Flexitouch, uses light pressure that she may tolerate well.  She could use this at home to help manage her swelling.  She is already getting PT elsewhere, which she is very satisfied with, to work on her LE strength issues.    Rehab Potential Good   PT Frequency --  May have one follow-up visit to check on benefit of lymphedema pump, once that has been tried.   PT Treatment/Interventions ADLs/Self Care Home Management;Patient/family education   PT Next Visit Plan Therapist will forward patient's information to [employer] to set in motion the process for her to try and obtain a Flexitouch lymphedema pump.  We may have a follow-up visit once she has had a demonstration/trial, to assess its benefit.  Patient  will pursue independently obtaining a new Circaid garment to wear at night and new compression stockings, prescribed by Dr. Sharol Given.  If these measures are not found to be adequate by the patient, we may resume discussion of trying complete decongestive therapy with the hope that she would be willing to try once more to see if she could tolerate that.   Consulted and Agree with Plan of Care Patient      Patient will benefit from skilled therapeutic intervention in order to improve the following deficits and impairments:  Abnormal gait, Decreased mobility, Decreased strength, Increased edema, Pain  Visit Diagnosis: Lymphedema, not elsewhere classified - Plan: PT plan of care  cert/re-cert  Other abnormalities of gait and mobility - Plan: PT plan of care cert/re-cert      G-Codes - [id number]    Self Care Goal Status [id number]) At least 1 percent but less than 20 percent impaired, limited or restricted       Problem List Patient Active Problem List   Diagnosis Date Noted  . 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 Symptomatic cholelithiasis 03/25/2014    SALISBURY,DONNA 02/29/2016, 9:02 AM  [place], [place], [address] Phone: [phone]   Fax:  [phone]  Name: Janice [name] MRN: [id number] Date of Birth: 07-10-1941  Serafina Royals, PT 02/29/16 9:02 AM

## 2016-03-04 ENCOUNTER — Encounter: Payer: Self-pay | Admitting: Podiatry

## 2016-03-04 ENCOUNTER — Ambulatory Visit (INDEPENDENT_AMBULATORY_CARE_PROVIDER_SITE_OTHER): Payer: Medicare Other | Admitting: Podiatry

## 2016-03-04 DIAGNOSIS — M79676 Pain in unspecified toe(s): Secondary | ICD-10-CM

## 2016-03-04 DIAGNOSIS — L84 Corns and callosities: Secondary | ICD-10-CM | POA: Diagnosis not present

## 2016-03-04 DIAGNOSIS — M79673 Pain in unspecified foot: Secondary | ICD-10-CM

## 2016-03-04 DIAGNOSIS — B351 Tinea unguium: Secondary | ICD-10-CM | POA: Diagnosis not present

## 2016-03-04 NOTE — Progress Notes (Signed)
This patient presents the office for continued care on her long painful nails. He also says she has developed a new painful corn on the fifth toe of the left foot. This was due in part to the brace that she uses to ambulate. She says the corn and the nails are both painful walking and wearing her shoes. She presents the office today for evaluation and treatment of her feet   GENERAL APPEARANCE: Alert, conversant. Appropriately groomed. No acute distress.  VASCULAR: Pedal pulses are  palpable at  [facility] and PT bilateral.  Capillary refill time is immediate to all digits,  Normal temperature gradient.  Digital hair growth is present bilateral  NEUROLOGIC: sensation is normal to 5.07 monofilament at 5/5 sites bilateral.  Light touch is intact bilateral, Muscle strength normal.  MUSCULOSKELETAL: acceptable muscle strength, tone and stability bilateral.  Intrinsic muscluature intact bilateral.  Rectus appearance of foot and digits noted bilateral.   DERMATOLOGIC: skin color, texture, and turgor are within normal limits.  No preulcerative lesions or ulcers  are seen, no interdigital maceration noted.  No open lesions present.   No drainage noted. Painful corn on dorsolateral aspect DIPJ fifth toe left foot.  NAILS  Thick disfigured discolored nails hallux B/L   DX  Onychomycosis B/L  Debride corn fifth toe left foot.   Tx.  Debridement and grinding of nails  Debridement corn fifth toe left foot.   Gardiner Barefoot DPM

## 2016-03-05 IMAGING — US US ABDOMEN LIMITED
1 series · 14 of 25 positions shown · non-contrast
Comparison: None.

CLINICAL DATA: Right upper quadrant pain

EXAM:
US ABDOMEN LIMITED - RIGHT UPPER QUADRANT

[Series 1: us abdomen limited · 0.24mm/px · 14 of 40 slices shown]
[im 1/40]
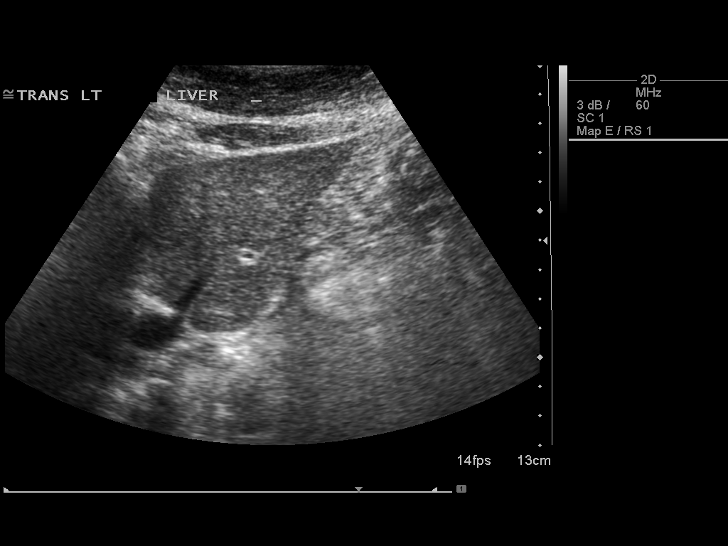
[im 4/40]
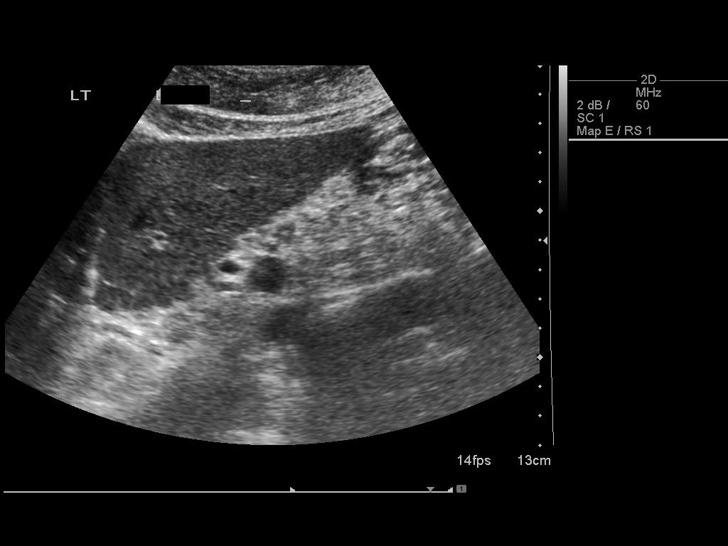
[im 7/40]
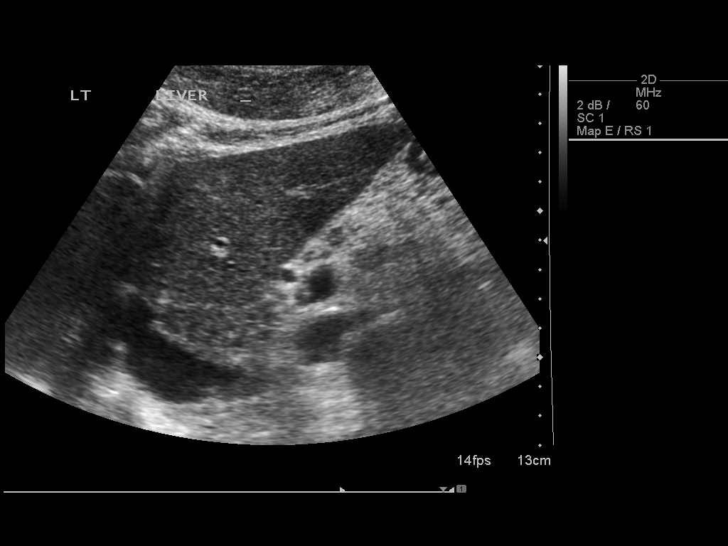
[im 10/40]
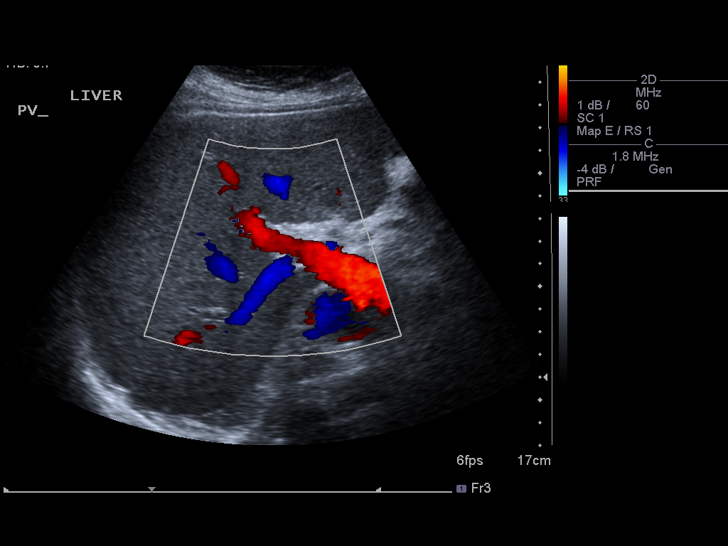
[im 14/40]
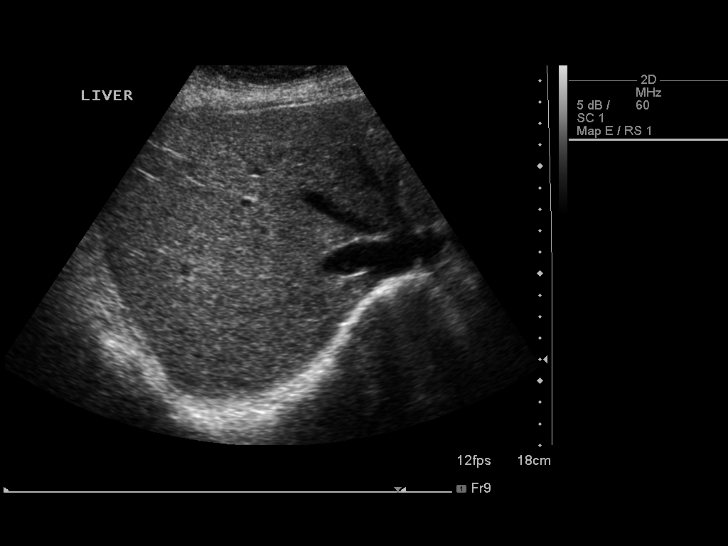
[im 15/40]
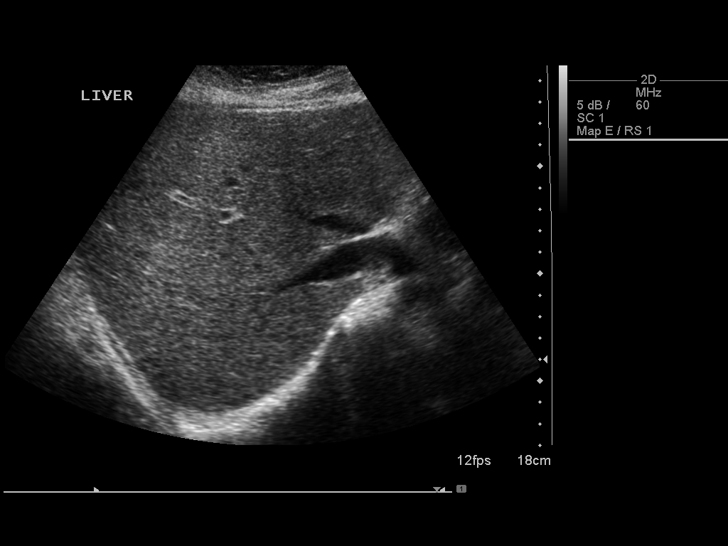
[im 18/40]
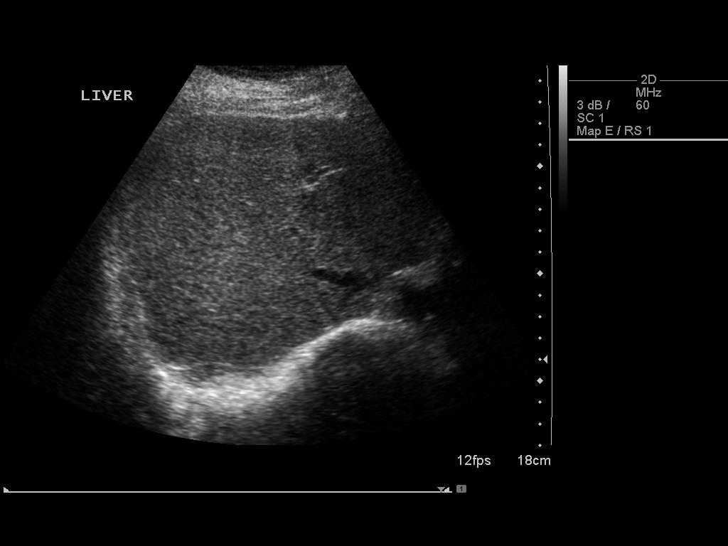
[im 22/40]
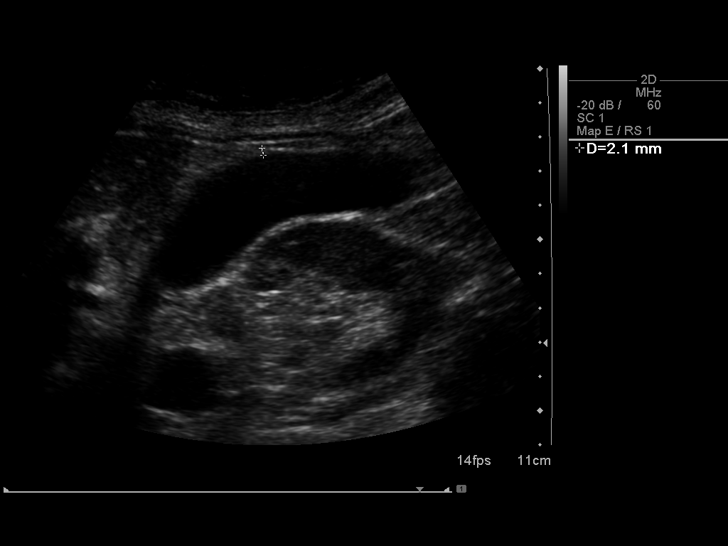
[im 25/40]
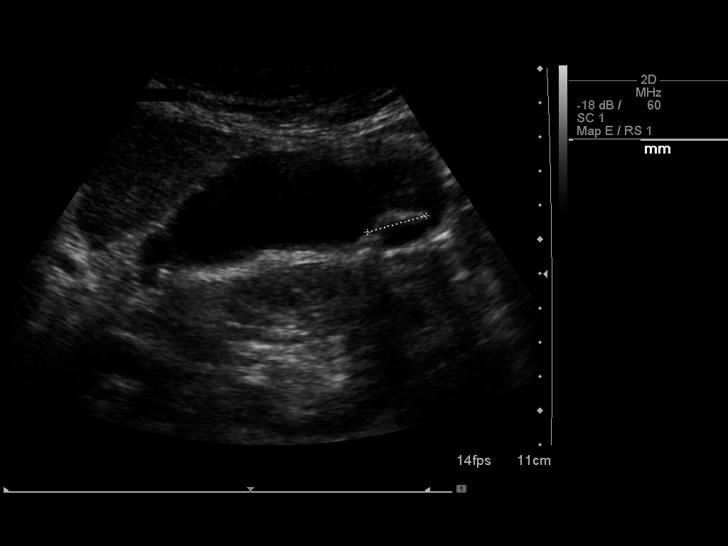
[im 27/40]
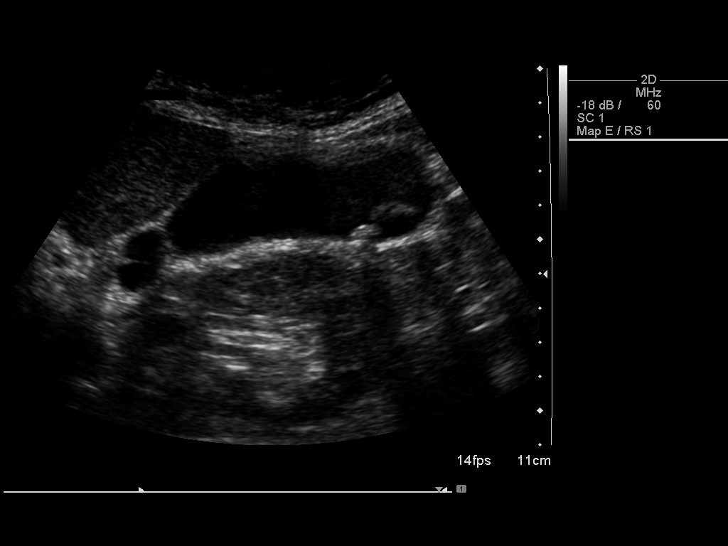
[im 30/40]
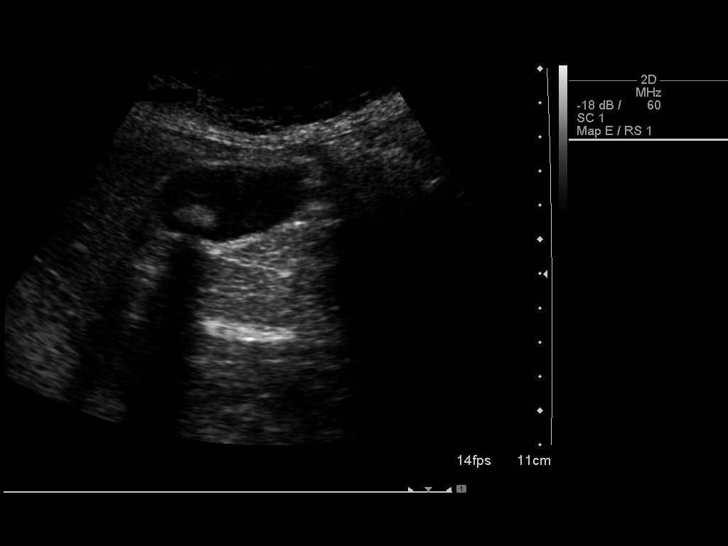
[im 33/40]
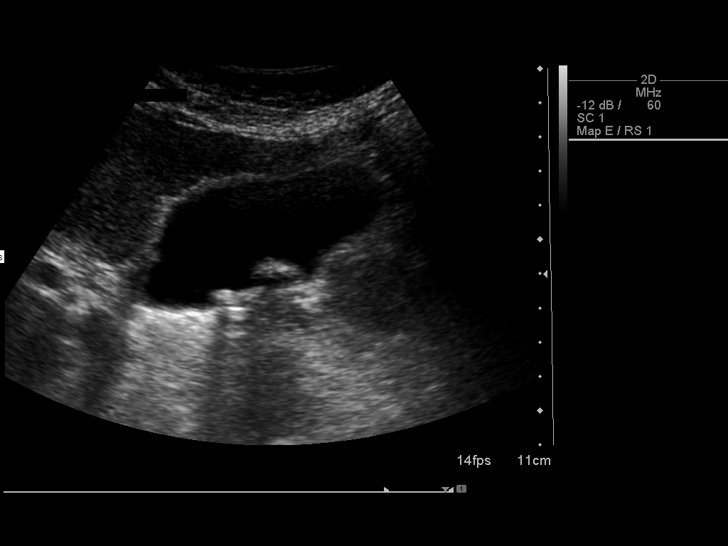
[im 36/40]
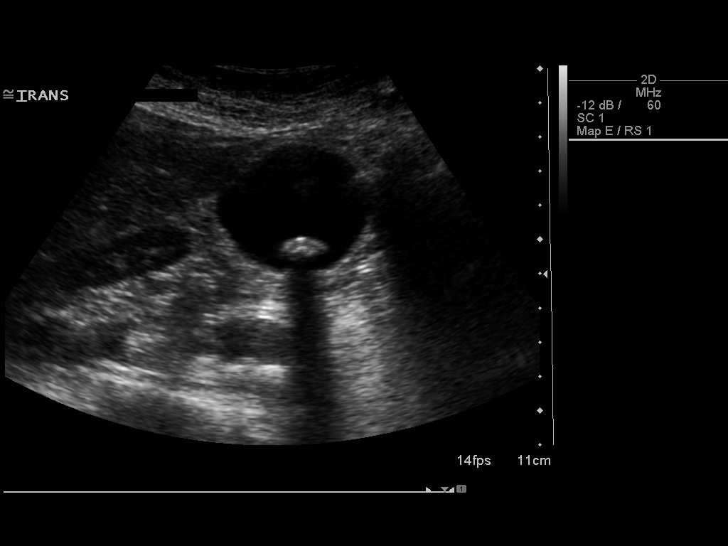
[im 40/40]
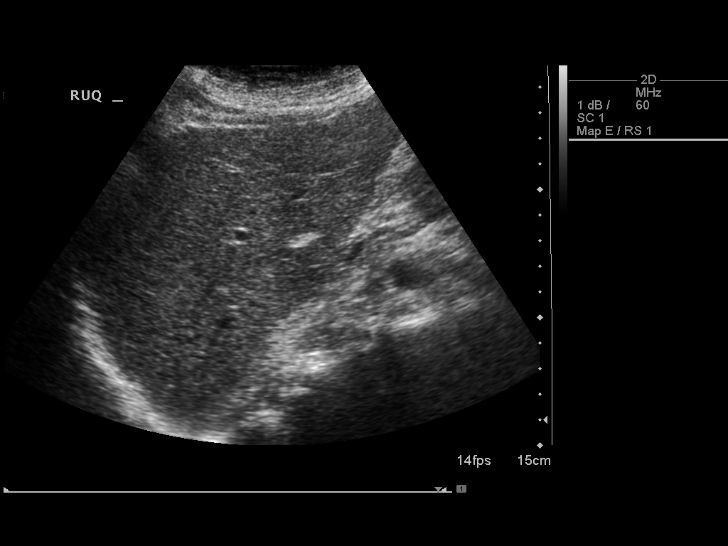

[14 of 25 positions shown; findings below may reference images not displayed]

FINDINGS: Gallbladder:

Multiple gallstones are identified. The largest of these measures
1.8 cm. No wall thickening is noted.

Common bile duct:

Diameter: 2.5 mm

Liver:

No focal lesion identified. Within normal limits in parenchymal
echogenicity.
IMPRESSION: Cholelithiasis without complicating factors.

## 2016-03-08 NOTE — Therapy (Addendum)
PHYSICAL THERAPY DISCHARGE SUMMARY  Visits from Start of Care: 1  Current functional level related to goals / functional outcomes: Patient has received a Biocompression pump.     Remaining deficits: Unknown, as patient did not return after initial visit.   Education / Equipment: Biocompression lymphedema pump was obtained. Plan: Patient agrees to discharge.  Patient goals were partially met. Patient is being discharged due to the patient's request.  ?????   The patient did not feel she could tolerate standard therapy, but was interested in trying a lymphedema pump.  This was arranged for her and she did receive the pump. She has not reported to [place] how beneficial she finds this.  Serafina Royals, PT 06/25/16 11:15 AM

## 2016-03-11 DIAGNOSIS — M6281 Muscle weakness (generalized): Secondary | ICD-10-CM | POA: Diagnosis not present

## 2016-03-11 DIAGNOSIS — M3312 Other dermatopolymyositis with myopathy: Secondary | ICD-10-CM | POA: Diagnosis not present

## 2016-03-14 DIAGNOSIS — M25551 Pain in right hip: Secondary | ICD-10-CM | POA: Diagnosis not present

## 2016-03-25 DIAGNOSIS — M6281 Muscle weakness (generalized): Secondary | ICD-10-CM | POA: Diagnosis not present

## 2016-03-25 DIAGNOSIS — M3312 Other dermatopolymyositis with myopathy: Secondary | ICD-10-CM | POA: Diagnosis not present

## 2016-04-05 ENCOUNTER — Other Ambulatory Visit: Payer: Medicare Other

## 2016-04-13 IMAGING — CR DG CHEST 2V
2 series · 2 of 2 positions shown · non-contrast
Comparison: October 16, 2011

CLINICAL DATA: Asthma ; preoperative cholecystectomy

EXAM:
CHEST  2 VIEW

[w chest pa]
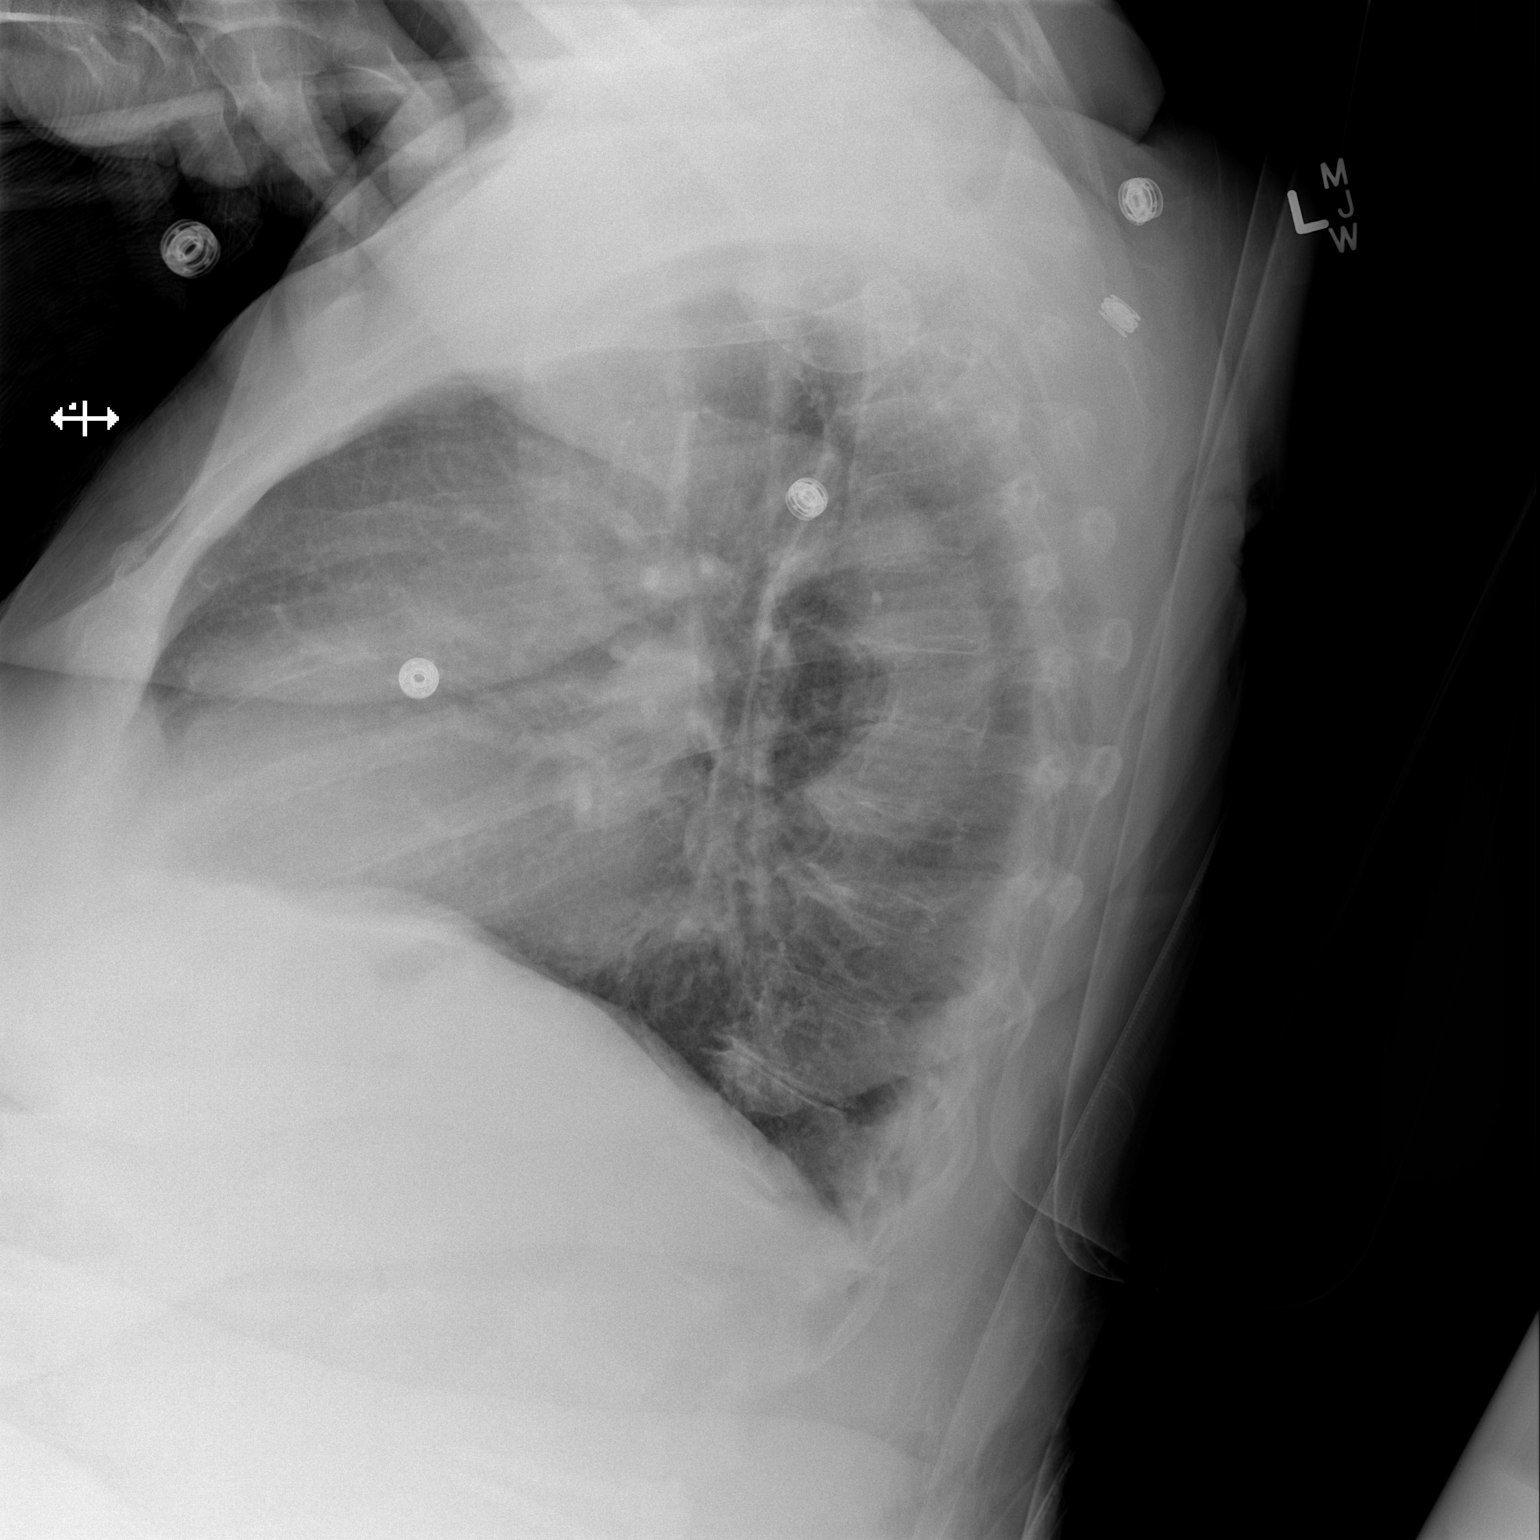

[x chest ap]
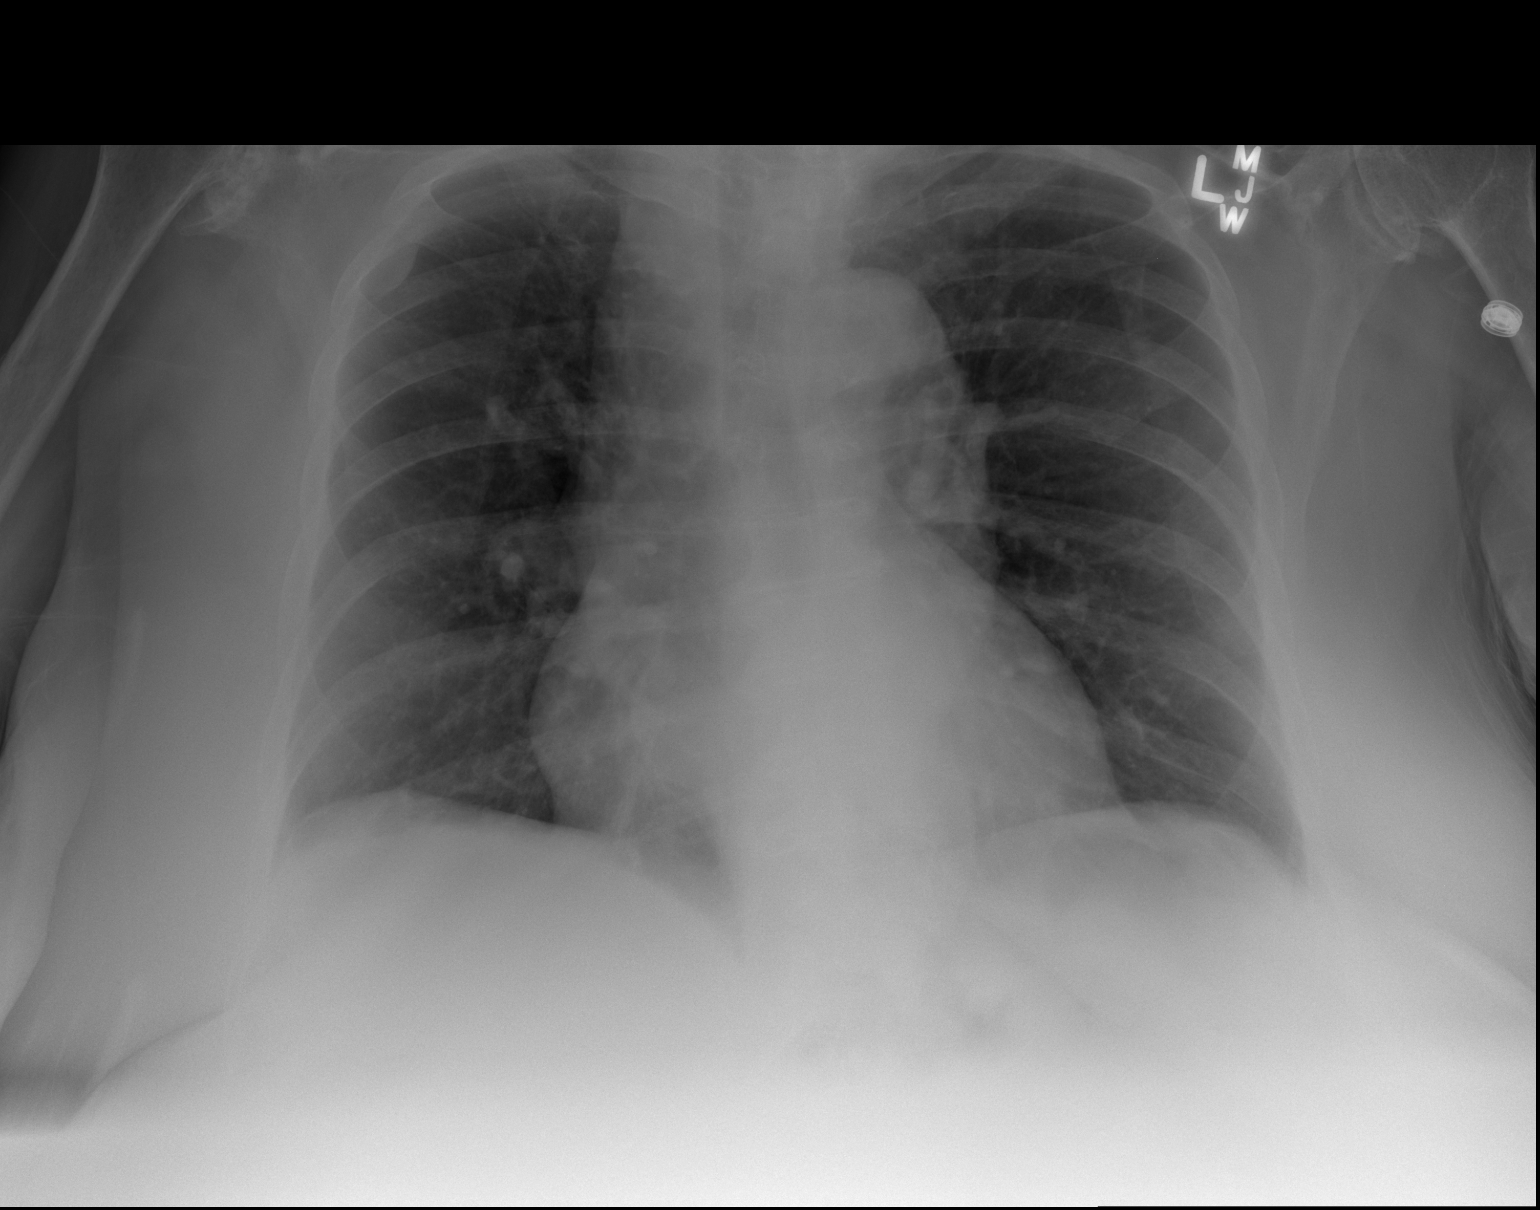

[2 of 2 positions shown; findings below may reference images not displayed]

FINDINGS: There is no edema or consolidation. The heart is upper normal in
size with pulmonary vascularity within normal limits. No appreciable
adenopathy. Prominence in the superior mediastinum is felt to be due
to great vessel prominence, a stable finding. Aorta is tortuous.
There is degenerative change in the thoracic spine. There is
arthropathy in both shoulders, more severe on the right than on the
left.
IMPRESSION: No edema or consolidation. Aorta is somewhat tortuous. This finding
may be indicative of chronic hypertension.

## 2016-04-23 DIAGNOSIS — I10 Essential (primary) hypertension: Secondary | ICD-10-CM | POA: Diagnosis not present

## 2016-04-23 DIAGNOSIS — E049 Nontoxic goiter, unspecified: Secondary | ICD-10-CM | POA: Diagnosis not present

## 2016-04-23 DIAGNOSIS — E039 Hypothyroidism, unspecified: Secondary | ICD-10-CM | POA: Diagnosis not present

## 2016-04-23 DIAGNOSIS — E559 Vitamin D deficiency, unspecified: Secondary | ICD-10-CM | POA: Diagnosis not present

## 2016-04-29 ENCOUNTER — Ambulatory Visit (INDEPENDENT_AMBULATORY_CARE_PROVIDER_SITE_OTHER): Payer: Medicare Other | Admitting: Orthopedic Surgery

## 2016-05-14 DIAGNOSIS — I10 Essential (primary) hypertension: Secondary | ICD-10-CM | POA: Diagnosis not present

## 2016-05-14 DIAGNOSIS — N3281 Overactive bladder: Secondary | ICD-10-CM | POA: Diagnosis not present

## 2016-05-14 DIAGNOSIS — E039 Hypothyroidism, unspecified: Secondary | ICD-10-CM | POA: Diagnosis not present

## 2016-05-14 DIAGNOSIS — J4530 Mild persistent asthma, uncomplicated: Secondary | ICD-10-CM | POA: Diagnosis not present

## 2016-05-14 DIAGNOSIS — M3392 Dermatopolymyositis, unspecified with myopathy: Secondary | ICD-10-CM | POA: Diagnosis not present

## 2016-05-16 DIAGNOSIS — M3312 Other dermatopolymyositis with myopathy: Secondary | ICD-10-CM | POA: Diagnosis not present

## 2016-05-16 DIAGNOSIS — D518 Other vitamin B12 deficiency anemias: Secondary | ICD-10-CM | POA: Diagnosis not present

## 2016-05-16 DIAGNOSIS — M150 Primary generalized (osteo)arthritis: Secondary | ICD-10-CM | POA: Diagnosis not present

## 2016-05-16 DIAGNOSIS — E039 Hypothyroidism, unspecified: Secondary | ICD-10-CM | POA: Diagnosis not present

## 2016-06-03 ENCOUNTER — Ambulatory Visit (INDEPENDENT_AMBULATORY_CARE_PROVIDER_SITE_OTHER): Payer: Medicare Other | Admitting: Podiatry

## 2016-06-03 ENCOUNTER — Encounter: Payer: Self-pay | Admitting: Podiatry

## 2016-06-03 VITALS — Resp 18 | Ht 61.0 in

## 2016-06-03 DIAGNOSIS — M2042 Other hammer toe(s) (acquired), left foot: Secondary | ICD-10-CM

## 2016-06-03 DIAGNOSIS — B351 Tinea unguium: Secondary | ICD-10-CM

## 2016-06-03 DIAGNOSIS — M79676 Pain in unspecified toe(s): Secondary | ICD-10-CM

## 2016-06-03 DIAGNOSIS — L84 Corns and callosities: Secondary | ICD-10-CM | POA: Diagnosis not present

## 2016-06-03 NOTE — Progress Notes (Signed)
This patient presents the office for continued care on her long painful nails. He also says she has developed a new painful corn on the fifth toe of the left foot. This was due in part to the brace that she uses to ambulate. She says the corn and the nails are both painful walking and wearing her shoes. She presents the office today for evaluation and treatment of her feet   GENERAL APPEARANCE: Alert, conversant. Appropriately groomed. No acute distress.  VASCULAR: Pedal pulses are  palpable at  [facility] and PT bilateral.  Capillary refill time is immediate to all digits,  Normal temperature gradient.  Digital hair growth is present bilateral  NEUROLOGIC: sensation is normal to 5.07 monofilament at 5/5 sites bilateral.  Light touch is intact bilateral, Muscle strength normal.  MUSCULOSKELETAL: acceptable muscle strength, tone and stability bilateral.  Intrinsic muscluature intact bilateral.  Rectus appearance of foot and digits noted bilateral.   DERMATOLOGIC: skin color, texture, and turgor are within normal limits.  No preulcerative lesions or ulcers  are seen, no interdigital maceration noted.  No open lesions present.   No drainage noted. Painful corn on dorsolateral aspect DIPJ fifth toe left foot.  NAILS  Thick disfigured discolored nails hallux B/L   DX  Onychomycosis B/L  Debride corn fifth toe left foot.   Tx.  Debridement and grinding of nails  Debridement corn fifth toe left foot. RTC 3 months   Gardiner Barefoot DPM

## 2016-06-04 DIAGNOSIS — M3312 Other dermatopolymyositis with myopathy: Secondary | ICD-10-CM | POA: Diagnosis not present

## 2016-06-04 DIAGNOSIS — M6281 Muscle weakness (generalized): Secondary | ICD-10-CM | POA: Diagnosis not present

## 2016-07-09 DIAGNOSIS — M6281 Muscle weakness (generalized): Secondary | ICD-10-CM | POA: Diagnosis not present

## 2016-07-09 DIAGNOSIS — M3312 Other dermatopolymyositis with myopathy: Secondary | ICD-10-CM | POA: Diagnosis not present

## 2016-07-16 DIAGNOSIS — M48061 Spinal stenosis, lumbar region without neurogenic claudication: Secondary | ICD-10-CM | POA: Diagnosis not present

## 2016-07-16 DIAGNOSIS — M150 Primary generalized (osteo)arthritis: Secondary | ICD-10-CM | POA: Diagnosis not present

## 2016-07-16 DIAGNOSIS — M3392 Dermatopolymyositis, unspecified with myopathy: Secondary | ICD-10-CM | POA: Diagnosis not present

## 2016-07-29 HISTORY — PX: ESOPHAGOGASTRODUODENOSCOPY: SHX1529

## 2016-08-06 DIAGNOSIS — M6281 Muscle weakness (generalized): Secondary | ICD-10-CM | POA: Diagnosis not present

## 2016-08-06 DIAGNOSIS — M3312 Other dermatopolymyositis with myopathy: Secondary | ICD-10-CM | POA: Diagnosis not present

## 2016-08-13 DIAGNOSIS — M6281 Muscle weakness (generalized): Secondary | ICD-10-CM | POA: Diagnosis not present

## 2016-08-13 DIAGNOSIS — M3312 Other dermatopolymyositis with myopathy: Secondary | ICD-10-CM | POA: Diagnosis not present

## 2016-08-20 DIAGNOSIS — M3312 Other dermatopolymyositis with myopathy: Secondary | ICD-10-CM | POA: Diagnosis not present

## 2016-08-20 DIAGNOSIS — M6281 Muscle weakness (generalized): Secondary | ICD-10-CM | POA: Diagnosis not present

## 2016-08-23 DIAGNOSIS — M6281 Muscle weakness (generalized): Secondary | ICD-10-CM | POA: Diagnosis not present

## 2016-08-23 DIAGNOSIS — M3312 Other dermatopolymyositis with myopathy: Secondary | ICD-10-CM | POA: Diagnosis not present

## 2016-08-27 DIAGNOSIS — M3312 Other dermatopolymyositis with myopathy: Secondary | ICD-10-CM | POA: Diagnosis not present

## 2016-08-27 DIAGNOSIS — M6281 Muscle weakness (generalized): Secondary | ICD-10-CM | POA: Diagnosis not present

## 2016-08-28 DIAGNOSIS — M3390 Dermatopolymyositis, unspecified, organ involvement unspecified: Secondary | ICD-10-CM | POA: Diagnosis not present

## 2016-08-28 DIAGNOSIS — M05161 Rheumatoid lung disease with rheumatoid arthritis of right knee: Secondary | ICD-10-CM | POA: Diagnosis not present

## 2016-08-28 DIAGNOSIS — R531 Weakness: Secondary | ICD-10-CM | POA: Diagnosis not present

## 2016-08-28 DIAGNOSIS — M3391 Dermatopolymyositis, unspecified with respiratory involvement: Secondary | ICD-10-CM | POA: Diagnosis not present

## 2016-08-28 DIAGNOSIS — R6883 Chills (without fever): Secondary | ICD-10-CM | POA: Diagnosis not present

## 2016-09-03 ENCOUNTER — Ambulatory Visit: Payer: Medicare Other | Admitting: Podiatry

## 2016-09-04 ENCOUNTER — Encounter (INDEPENDENT_AMBULATORY_CARE_PROVIDER_SITE_OTHER): Payer: Self-pay

## 2016-09-04 ENCOUNTER — Ambulatory Visit (INDEPENDENT_AMBULATORY_CARE_PROVIDER_SITE_OTHER): Payer: Medicare Other | Admitting: Family

## 2016-09-04 VITALS — Ht 61.0 in | Wt 246.0 lb

## 2016-09-04 DIAGNOSIS — I890 Lymphedema, not elsewhere classified: Secondary | ICD-10-CM

## 2016-09-04 DIAGNOSIS — M6281 Muscle weakness (generalized): Secondary | ICD-10-CM | POA: Diagnosis not present

## 2016-09-04 DIAGNOSIS — M3312 Other dermatopolymyositis with myopathy: Secondary | ICD-10-CM | POA: Diagnosis not present

## 2016-09-04 NOTE — Progress Notes (Signed)
Office Visit Note   Patient: Janice [name]           Date of Birth: May 12, 1941           MRN: [id number] Visit Date: 09/04/2016              Requested by: Carol Ada, MD [employer] [place], [place] [address] PCP: Reginia Naas, MD  No chief complaint on file.   HPI: Lymphedema to the right lower extremity. Pt states that she has been using an at home pump but she is not sure about what settings she is supposed to be using or the duration she should keep it on. She currently is using 1-2 hours at night. She states that her therapist was worried about the leg having an increase in swelling and  That the skin was red. The pt states that this has since resolved but she was still nervous about this and wanted it " checked out" she is weight bearing with a walker in a double up right brace with extra depth shoe and orthotics. She does not have any skin break down and there is no weaping or redness noted. Pamella Pert, RMA  Patient is a 76 year old woman seen today for evaluation of right lower extremity lymphedema with concern for cellulitis. Was at a physical therapy appointment earlier today when therapist noticed redness to the right leg. Was concerned for infection and recommended she be seen today. Is seen as a urgent work in. The patient has been wearing a night pump for lymphedema off an on for years. Wears it for 1-2 hours. States she was never told by the manufacturer how long to wear this. Has used compression stockings, wrappings as well as the circaid in the past. These were too painful for her to tolerate. Does still use the circaid some but feels it does not provide adequate compression.    Assessment & Plan: Visit Diagnoses: No diagnosis found.  Plan: Offered Compression wraps. Patient deferred at this time. Would like to resume her lymphedema pump in the evening. Has had good results with this in the past. Recommended that she call the manufacturer  for pump settings and recommendations for use. Reassurance provided, no cellulitis today. Will follow up for continued issues with swelling or concern for infection.   Follow-Up Instructions: No Follow-up on file.   Ortho Exam Physical Exam  Constitutional: Appears well-developed.  Head: Normocephalic.  Eyes: EOM are normal.  Neck: Normal range of motion.  Cardiovascular: Normal rate.   Pulmonary/Chest: Effort normal.  Neurological: Is alert.  Skin: Skin is warm.  Psychiatric: Has a normal mood and affect. Does have an antalgic gait, ampulatory with rolling walker.  Right lower extremity: 3+ pitting edema to the right lower extremity up to tibial tubercle. No erythema, no warmth. No dermatitis or ulceration.   Imaging: No results found.  Orders:  No orders of the defined types were placed in this encounter.  No orders of the defined types were placed in this encounter.    Procedures: No procedures performed  Clinical Data: No additional findings.  Subjective: Review of Systems  Constitutional: Negative for chills and fever.  Cardiovascular: Positive for leg swelling.  Skin: Negative for color change, rash and wound.    Objective: Vital Signs: There were no vitals taken for this visit.  Specialty Comments:  No specialty comments available.  PMFS History: Patient Active Problem List   Diagnosis Date Noted  .  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 Symptomatic cholelithiasis 03/25/2014   Past Medical History:  Diagnosis Date  . Anemia    rec'd her own blood in the past postop x2   . Arthritis    OSA- knees & shoulders   . Asthma   . 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 Family history of anesthesia complication    SISTER WAKES UP FIGHTING "  . Fibromyalgia    perhaps, not sure    . GERD (gastroesophageal reflux disease)   . Headache    migraine history  . Heart murmur    echo 09-very mild  . Hx of appendectomy     . Hypertension, benign   . Hypothyroidism   . Muscular weakness   . Sciatic pain   . Sleep apnea    sleep study done /w Dr. Maxwell Caul, uses CPAP q night   . Spinal stenosis   . Stenosis of right carotid artery   . Trigeminal neuralgia   . Wears glasses     Family History  Problem Relation Age of Onset  . Cancer Mother   . Diabetes Maternal Grandmother   . Heart attack Father   . Cancer Father     Past Surgical History:  Procedure Laterality Date  . APPENDECTOMY    . CHOLECYSTECTOMY  04/12/2014   DR BLACMAN  . CHOLECYSTECTOMY N/A 04/12/2014   Procedure: LAPAROSCOPIC CHOLECYSTECTOMY;  Surgeon: Coralie Keens, MD;  Location: [place];  Service: General;  Laterality: N/A;  . EYE SURGERY     cataracts removed, /w IOL - bilateral   . LUMBAR FUSION  2004  . SHOULDER SURGERY  2000   right  . TONSILLECTOMY    . TOTAL HIP ARTHROPLASTY  2005   right  . TOTAL KNEE ARTHROPLASTY  2001   right  . TOTAL KNEE ARTHROPLASTY Left 02/01/2015  . TOTAL KNEE ARTHROPLASTY Left 02/01/2015   Procedure: LEFT TOTAL KNEE ARTHROPLASTY;  Surgeon: Newt Minion, MD;  Location: [place];  Service: Orthopedics;  Laterality: Left;   Social History   Occupational History  . Not on file.   Social History Main Topics  . Smoking status: Never Smoker  . Smokeless tobacco: Never Used  . Alcohol use No  . Drug use: No  . Sexual activity: Not on file

## 2016-09-06 DIAGNOSIS — M05161 Rheumatoid lung disease with rheumatoid arthritis of right knee: Secondary | ICD-10-CM | POA: Diagnosis not present

## 2016-09-06 DIAGNOSIS — M3391 Dermatopolymyositis, unspecified with respiratory involvement: Secondary | ICD-10-CM | POA: Diagnosis not present

## 2016-09-06 DIAGNOSIS — R6883 Chills (without fever): Secondary | ICD-10-CM | POA: Diagnosis not present

## 2016-09-06 DIAGNOSIS — R918 Other nonspecific abnormal finding of lung field: Secondary | ICD-10-CM | POA: Diagnosis not present

## 2016-09-10 DIAGNOSIS — M3312 Other dermatopolymyositis with myopathy: Secondary | ICD-10-CM | POA: Diagnosis not present

## 2016-09-10 DIAGNOSIS — M6281 Muscle weakness (generalized): Secondary | ICD-10-CM | POA: Diagnosis not present

## 2016-09-13 DIAGNOSIS — R1310 Dysphagia, unspecified: Secondary | ICD-10-CM | POA: Diagnosis not present

## 2016-09-13 DIAGNOSIS — R933 Abnormal findings on diagnostic imaging of other parts of digestive tract: Secondary | ICD-10-CM | POA: Insufficient documentation

## 2016-09-13 DIAGNOSIS — R1013 Epigastric pain: Secondary | ICD-10-CM | POA: Diagnosis not present

## 2016-09-13 DIAGNOSIS — Z1211 Encounter for screening for malignant neoplasm of colon: Secondary | ICD-10-CM | POA: Diagnosis not present

## 2016-09-24 DIAGNOSIS — D128 Benign neoplasm of rectum: Secondary | ICD-10-CM | POA: Diagnosis not present

## 2016-09-24 DIAGNOSIS — K648 Other hemorrhoids: Secondary | ICD-10-CM | POA: Diagnosis not present

## 2016-09-24 DIAGNOSIS — K644 Residual hemorrhoidal skin tags: Secondary | ICD-10-CM | POA: Diagnosis not present

## 2016-09-24 DIAGNOSIS — K222 Esophageal obstruction: Secondary | ICD-10-CM | POA: Diagnosis not present

## 2016-09-24 DIAGNOSIS — Z96641 Presence of right artificial hip joint: Secondary | ICD-10-CM | POA: Diagnosis not present

## 2016-09-24 DIAGNOSIS — K2210 Ulcer of esophagus without bleeding: Secondary | ICD-10-CM | POA: Diagnosis not present

## 2016-09-24 DIAGNOSIS — Z96653 Presence of artificial knee joint, bilateral: Secondary | ICD-10-CM | POA: Diagnosis not present

## 2016-09-24 DIAGNOSIS — J45909 Unspecified asthma, uncomplicated: Secondary | ICD-10-CM | POA: Diagnosis not present

## 2016-09-24 DIAGNOSIS — K210 Gastro-esophageal reflux disease with esophagitis: Secondary | ICD-10-CM | POA: Diagnosis not present

## 2016-09-24 DIAGNOSIS — M1990 Unspecified osteoarthritis, unspecified site: Secondary | ICD-10-CM | POA: Diagnosis not present

## 2016-09-24 DIAGNOSIS — Z1211 Encounter for screening for malignant neoplasm of colon: Secondary | ICD-10-CM | POA: Diagnosis not present

## 2016-09-24 DIAGNOSIS — K449 Diaphragmatic hernia without obstruction or gangrene: Secondary | ICD-10-CM | POA: Diagnosis not present

## 2016-09-24 DIAGNOSIS — Z880 Allergy status to penicillin: Secondary | ICD-10-CM | POA: Diagnosis not present

## 2016-09-24 DIAGNOSIS — E785 Hyperlipidemia, unspecified: Secondary | ICD-10-CM | POA: Diagnosis not present

## 2016-09-24 DIAGNOSIS — K269 Duodenal ulcer, unspecified as acute or chronic, without hemorrhage or perforation: Secondary | ICD-10-CM | POA: Diagnosis not present

## 2016-09-24 DIAGNOSIS — I10 Essential (primary) hypertension: Secondary | ICD-10-CM | POA: Diagnosis not present

## 2016-09-24 DIAGNOSIS — Z7982 Long term (current) use of aspirin: Secondary | ICD-10-CM | POA: Diagnosis not present

## 2016-09-24 DIAGNOSIS — Z79899 Other long term (current) drug therapy: Secondary | ICD-10-CM | POA: Diagnosis not present

## 2016-09-24 DIAGNOSIS — K259 Gastric ulcer, unspecified as acute or chronic, without hemorrhage or perforation: Secondary | ICD-10-CM | POA: Diagnosis not present

## 2016-09-24 DIAGNOSIS — R1310 Dysphagia, unspecified: Secondary | ICD-10-CM | POA: Diagnosis not present

## 2016-09-24 DIAGNOSIS — E039 Hypothyroidism, unspecified: Secondary | ICD-10-CM | POA: Diagnosis not present

## 2016-09-24 DIAGNOSIS — G4730 Sleep apnea, unspecified: Secondary | ICD-10-CM | POA: Diagnosis not present

## 2016-10-15 DIAGNOSIS — M150 Primary generalized (osteo)arthritis: Secondary | ICD-10-CM | POA: Diagnosis not present

## 2016-10-15 DIAGNOSIS — M48061 Spinal stenosis, lumbar region without neurogenic claudication: Secondary | ICD-10-CM | POA: Diagnosis not present

## 2016-10-15 DIAGNOSIS — M3312 Other dermatopolymyositis with myopathy: Secondary | ICD-10-CM | POA: Diagnosis not present

## 2016-10-15 DIAGNOSIS — M3392 Dermatopolymyositis, unspecified with myopathy: Secondary | ICD-10-CM | POA: Diagnosis not present

## 2016-10-24 ENCOUNTER — Encounter: Payer: Self-pay | Admitting: Podiatry

## 2016-10-24 ENCOUNTER — Ambulatory Visit (INDEPENDENT_AMBULATORY_CARE_PROVIDER_SITE_OTHER): Payer: Medicare Other | Admitting: Podiatry

## 2016-10-24 DIAGNOSIS — B351 Tinea unguium: Secondary | ICD-10-CM | POA: Diagnosis not present

## 2016-10-24 DIAGNOSIS — M79676 Pain in unspecified toe(s): Secondary | ICD-10-CM | POA: Diagnosis not present

## 2016-10-24 DIAGNOSIS — M2042 Other hammer toe(s) (acquired), left foot: Secondary | ICD-10-CM

## 2016-10-24 DIAGNOSIS — Q828 Other specified congenital malformations of skin: Secondary | ICD-10-CM | POA: Diagnosis not present

## 2016-10-24 NOTE — Progress Notes (Signed)
This patient presents the office for continued care on her long painful nails. He also says she has developed a new painful corn on the fifth toe of the left foot. This was due in part to the brace that she uses to ambulate. She says the corn and the nails are both painful walking and wearing her shoes. She presents the office today for evaluation and treatment of her feet   GENERAL APPEARANCE: Alert, conversant. Appropriately groomed. No acute distress.  VASCULAR: Pedal pulses are  palpable at  [facility] and PT bilateral.  Capillary refill time is immediate to all digits,  Normal temperature gradient.  Digital hair growth is present bilateral  NEUROLOGIC: sensation is normal to 5.07 monofilament at 5/5 sites bilateral.  Light touch is intact bilateral, Muscle strength normal.  MUSCULOSKELETAL: acceptable muscle strength, tone and stability bilateral.  Intrinsic muscluature intact bilateral.  Rectus appearance of foot and digits noted bilateral.   DERMATOLOGIC: skin color, texture, and turgor are within normal limits.  No preulcerative lesions or ulcers  are seen, no interdigital maceration noted.  No open lesions present.   No drainage noted. Painful corn on dorsolateral aspect DIPJ fifth toe left foot. Porokeratosis sub 5th metabase right foot.  NAILS  Thick disfigured discolored nails hallux B/L.  Debride porokeratosis/corn   DX  Onychomycosis B/L  Debride corn fifth toe left foot.   Tx.  Debridement and grinding of nails  Debridement corn fifth toe left foot. Debridement of porokeratosis. RTC 3 months   Gardiner Barefoot DPM

## 2016-12-02 DIAGNOSIS — I890 Lymphedema, not elsewhere classified: Secondary | ICD-10-CM | POA: Diagnosis not present

## 2016-12-19 DIAGNOSIS — G4733 Obstructive sleep apnea (adult) (pediatric): Secondary | ICD-10-CM | POA: Diagnosis not present

## 2017-01-07 DIAGNOSIS — G4733 Obstructive sleep apnea (adult) (pediatric): Secondary | ICD-10-CM | POA: Diagnosis not present

## 2017-01-28 ENCOUNTER — Ambulatory Visit (INDEPENDENT_AMBULATORY_CARE_PROVIDER_SITE_OTHER): Payer: Medicare Other | Admitting: Podiatry

## 2017-01-28 ENCOUNTER — Encounter: Payer: Self-pay | Admitting: Podiatry

## 2017-01-28 DIAGNOSIS — B351 Tinea unguium: Secondary | ICD-10-CM | POA: Diagnosis not present

## 2017-01-28 DIAGNOSIS — Q828 Other specified congenital malformations of skin: Secondary | ICD-10-CM | POA: Diagnosis not present

## 2017-01-28 DIAGNOSIS — M2042 Other hammer toe(s) (acquired), left foot: Secondary | ICD-10-CM

## 2017-01-28 DIAGNOSIS — M79676 Pain in unspecified toe(s): Secondary | ICD-10-CM | POA: Diagnosis not present

## 2017-01-28 NOTE — Progress Notes (Signed)
This patient presents the office for continued care on her long painful nails. He also says she has developed a painful  corn on the fifth toe of the left foot. This was due in part to the brace that she uses to ambulate.  She presents the office today for evaluation and treatment of her feet   GENERAL APPEARANCE: Alert, conversant. Appropriately groomed. No acute distress.  VASCULAR: Pedal pulses are  palpable at  [facility] and PT bilateral.  Capillary refill time is immediate to all digits,  Normal temperature gradient.  Digital hair growth is present bilateral  NEUROLOGIC: sensation is normal to 5.07 monofilament at 5/5 sites bilateral.  Light touch is intact bilateral, Muscle strength normal.  MUSCULOSKELETAL: acceptable muscle strength, tone and stability bilateral.  Intrinsic muscluature intact bilateral.  Rectus appearance of foot and digits noted bilateral.   DERMATOLOGIC: skin color, texture, and turgor are within normal limits.  No preulcerative lesions or ulcers  are seen, no interdigital maceration noted.  No open lesions present.   No drainage noted. Painful corn on dorsolateral aspect DIPJ fifth toe left foot. Porokeratosis sub 5th metabase right foot.  NAILS  Thick disfigured discolored nails hallux B/L.  Debride porokeratosis/corn   DX  Onychomycosis B/L  Debride corn fifth toe left foot.   Tx.  Debridement and grinding of nails  Debridement corn fifth toe left foot. Marland Kitchen RTC 3 months   Gardiner Barefoot DPM

## 2017-02-04 DIAGNOSIS — E559 Vitamin D deficiency, unspecified: Secondary | ICD-10-CM | POA: Diagnosis not present

## 2017-02-04 DIAGNOSIS — N3281 Overactive bladder: Secondary | ICD-10-CM | POA: Diagnosis not present

## 2017-02-04 DIAGNOSIS — E039 Hypothyroidism, unspecified: Secondary | ICD-10-CM | POA: Diagnosis not present

## 2017-02-04 DIAGNOSIS — I10 Essential (primary) hypertension: Secondary | ICD-10-CM | POA: Diagnosis not present

## 2017-02-04 DIAGNOSIS — M3392 Dermatopolymyositis, unspecified with myopathy: Secondary | ICD-10-CM | POA: Diagnosis not present

## 2017-02-04 DIAGNOSIS — E7800 Pure hypercholesterolemia, unspecified: Secondary | ICD-10-CM | POA: Diagnosis not present

## 2017-02-04 DIAGNOSIS — J4530 Mild persistent asthma, uncomplicated: Secondary | ICD-10-CM | POA: Diagnosis not present

## 2017-02-14 ENCOUNTER — Other Ambulatory Visit: Payer: Self-pay | Admitting: Endocrinology

## 2017-02-14 DIAGNOSIS — E049 Nontoxic goiter, unspecified: Secondary | ICD-10-CM

## 2017-02-24 DIAGNOSIS — M3390 Dermatopolymyositis, unspecified, organ involvement unspecified: Secondary | ICD-10-CM | POA: Diagnosis not present

## 2017-02-24 DIAGNOSIS — M150 Primary generalized (osteo)arthritis: Secondary | ICD-10-CM | POA: Diagnosis not present

## 2017-02-24 DIAGNOSIS — M48062 Spinal stenosis, lumbar region with neurogenic claudication: Secondary | ICD-10-CM | POA: Diagnosis not present

## 2017-02-24 DIAGNOSIS — M62838 Other muscle spasm: Secondary | ICD-10-CM | POA: Diagnosis not present

## 2017-02-26 DIAGNOSIS — R252 Cramp and spasm: Secondary | ICD-10-CM | POA: Diagnosis not present

## 2017-02-26 DIAGNOSIS — Z79899 Other long term (current) drug therapy: Secondary | ICD-10-CM | POA: Diagnosis not present

## 2017-02-26 DIAGNOSIS — M3390 Dermatopolymyositis, unspecified, organ involvement unspecified: Secondary | ICD-10-CM | POA: Diagnosis not present

## 2017-03-03 DIAGNOSIS — S81802A Unspecified open wound, left lower leg, initial encounter: Secondary | ICD-10-CM | POA: Diagnosis not present

## 2017-03-12 ENCOUNTER — Encounter (HOSPITAL_COMMUNITY): Payer: Self-pay

## 2017-03-12 ENCOUNTER — Inpatient Hospital Stay (HOSPITAL_COMMUNITY)
Admission: EM | Admit: 2017-03-12 | Discharge: 2017-03-15 | DRG: 603 | Disposition: A | Discharge status: Home / Self Care | Payer: Medicare Other | Attending: Family Medicine | Admitting: Family Medicine

## 2017-03-12 DIAGNOSIS — E039 Hypothyroidism, unspecified: Secondary | ICD-10-CM | POA: Diagnosis present

## 2017-03-12 DIAGNOSIS — I10 Essential (primary) hypertension: Secondary | ICD-10-CM | POA: Diagnosis present

## 2017-03-12 DIAGNOSIS — Z880 Allergy status to penicillin: Secondary | ICD-10-CM

## 2017-03-12 DIAGNOSIS — Z888 Allergy status to other drugs, medicaments and biological substances status: Secondary | ICD-10-CM

## 2017-03-12 DIAGNOSIS — L03116 Cellulitis of left lower limb: Secondary | ICD-10-CM | POA: Diagnosis not present

## 2017-03-12 DIAGNOSIS — E119 Type 2 diabetes mellitus without complications: Secondary | ICD-10-CM | POA: Diagnosis present

## 2017-03-12 DIAGNOSIS — Z833 Family history of diabetes mellitus: Secondary | ICD-10-CM

## 2017-03-12 DIAGNOSIS — M3313 Other dermatomyositis without myopathy: Secondary | ICD-10-CM | POA: Diagnosis not present

## 2017-03-12 DIAGNOSIS — Z881 Allergy status to other antibiotic agents status: Secondary | ICD-10-CM

## 2017-03-12 DIAGNOSIS — G4733 Obstructive sleep apnea (adult) (pediatric): Secondary | ICD-10-CM | POA: Diagnosis not present

## 2017-03-12 DIAGNOSIS — Z981 Arthrodesis status: Secondary | ICD-10-CM

## 2017-03-12 DIAGNOSIS — J45909 Unspecified asthma, uncomplicated: Secondary | ICD-10-CM | POA: Diagnosis present

## 2017-03-12 DIAGNOSIS — M3390 Dermatopolymyositis, unspecified, organ involvement unspecified: Secondary | ICD-10-CM

## 2017-03-12 DIAGNOSIS — M62838 Other muscle spasm: Secondary | ICD-10-CM | POA: Diagnosis present

## 2017-03-12 DIAGNOSIS — M7989 Other specified soft tissue disorders: Secondary | ICD-10-CM

## 2017-03-12 DIAGNOSIS — Z96652 Presence of left artificial knee joint: Secondary | ICD-10-CM | POA: Diagnosis present

## 2017-03-12 DIAGNOSIS — Z9989 Dependence on other enabling machines and devices: Secondary | ICD-10-CM

## 2017-03-12 DIAGNOSIS — K219 Gastro-esophageal reflux disease without esophagitis: Secondary | ICD-10-CM | POA: Diagnosis present

## 2017-03-12 HISTORY — DX: Cellulitis of left lower limb: L03.116

## 2017-03-12 HISTORY — DX: Cutaneous abscess of left lower limb: L02.416

## 2017-03-12 LAB — COMPREHENSIVE METABOLIC PANEL
ALK PHOS: 75 U/L (ref 38–126)
ALT: 16 U/L (ref 14–54)
AST: 18 U/L (ref 15–41)
Albumin: 3.4 g/dL — ABNORMAL LOW (ref 3.5–5.0)
Anion gap: 9 (ref 5–15)
BUN: 15 mg/dL (ref 6–20)
CALCIUM: 9.5 mg/dL (ref 8.9–10.3)
CO2: 24 mmol/L (ref 22–32)
CREATININE: 0.68 mg/dL (ref 0.44–1.00)
Chloride: 104 mmol/L (ref 101–111)
GFR calc non Af Amer: >60 mL/min (ref >60)
Glucose, Bld: 152 mg/dL — ABNORMAL HIGH (ref 65–99)
Potassium: 3.9 mmol/L (ref 3.5–5.1)
Sodium: 137 mmol/L (ref 135–145)
Total Bilirubin: 0.4 mg/dL (ref 0.3–1.2)
Total Protein: 7 g/dL (ref 6.5–8.1)

## 2017-03-12 LAB — CBC WITH DIFFERENTIAL/PLATELET
BASOS PCT: 0 %
Basophils Absolute: 0 10*3/uL (ref 0.0–0.1)
EOS PCT: 2 %
Eosinophils Absolute: 0.2 10*3/uL (ref 0.0–0.7)
HCT: 37 % (ref 36.0–46.0)
Hemoglobin: 12.3 g/dL (ref 12.0–15.0)
Lymphocytes Relative: 21 %
Lymphs Abs: 1.8 10*3/uL (ref 0.7–4.0)
MCH: 28.9 pg (ref 26.0–34.0)
MCHC: 33.2 g/dL (ref 30.0–36.0)
MCV: 86.9 fL (ref 78.0–100.0)
Monocytes Absolute: 0.6 10*3/uL (ref 0.1–1.0)
Monocytes Relative: 7 %
NEUTROS PCT: 70 %
Neutro Abs: 5.9 10*3/uL (ref 1.7–7.7)
Platelets: 322 10*3/uL (ref 150–400)
RBC: 4.26 MIL/uL (ref 3.87–5.11)
RDW: 12.8 % (ref 11.5–15.5)
WBC: 8.7 10*3/uL (ref 4.0–10.5)

## 2017-03-12 LAB — I-STAT CG4 LACTIC ACID, ED: Lactic Acid, Venous: 1.61 mmol/L (ref 0.5–1.9)

## 2017-03-12 MED ORDER — VANCOMYCIN HCL 10 G IV SOLR
2000.0000 mg | Freq: Once | INTRAVENOUS | Status: AC
  Administered 2017-03-13: 2000 mg via INTRAVENOUS
  Filled 2017-03-12: qty 2000

## 2017-03-12 MED ORDER — SODIUM CHLORIDE 0.9 % IV BOLUS (SEPSIS)
1000.0000 mL | Freq: Once | INTRAVENOUS | Status: AC
  Administered 2017-03-13: 1000 mL via INTRAVENOUS

## 2017-03-12 NOTE — ED Provider Notes (Signed)
[facility] DEPT Provider Note   CSN: [id number] Arrival date & time: 03/12/17  2423     History   Chief Complaint Chief Complaint  Patient presents with  . Cellulitis    HPI Janice [name] is a 76 y.o. female with history of lymphedema and diabetes presenting with left lower extremity swelling, erythema and pain for the past 2 weeks. She was previously seen for this and prescribed Keflex and has completed the course for the last 10 days without improvement. Swelling has increased and pain has increased. She also reports feeling generally ill and fatigued over the past week, no fever, chills nausea, vomiting.  HPI  Past Medical History:  Diagnosis Date  . Anemia    rec'd her own blood in the past postop x2   . Arthritis    OSA- knees & shoulders   . Asthma   . 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 Family history of anesthesia complication    SISTER WAKES UP FIGHTING "  . Fibromyalgia    perhaps, not sure    . GERD (gastroesophageal reflux disease)   . Headache    migraine history  . Heart murmur    echo 09-very mild  . Hx of appendectomy   . Hypertension, benign   . Hypothyroidism   . Muscular weakness   . Sciatic pain   . Sleep apnea    sleep study done /w Dr. Maxwell Caul, uses CPAP q night   . Spinal stenosis   . Stenosis of right carotid artery   . Trigeminal neuralgia   . Wears glasses     Patient Active Problem List   Diagnosis Date Noted  . Essential hypertension 03/13/2017  . Cellulitis of left leg 03/13/2017  . Hypothyroidism   . Dermatomyositis ([place])   . Asthma   . 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 Symptomatic cholelithiasis 03/25/2014    Past Surgical History:  Procedure Laterality Date  . APPENDECTOMY    . CHOLECYSTECTOMY  04/12/2014   DR BLACMAN  . CHOLECYSTECTOMY N/A 04/12/2014   Procedure: LAPAROSCOPIC CHOLECYSTECTOMY;  Surgeon: Coralie Keens, MD;  Location: [facility];  Service: General;   Laterality: N/A;  . EYE SURGERY     cataracts removed, /w IOL - bilateral   . LUMBAR FUSION  2004  . SHOULDER SURGERY  2000   right  . TONSILLECTOMY    . TOTAL HIP ARTHROPLASTY  2005   right  . TOTAL KNEE ARTHROPLASTY  2001   right  . TOTAL KNEE ARTHROPLASTY Left 02/01/2015  . TOTAL KNEE ARTHROPLASTY Left 02/01/2015   Procedure: LEFT TOTAL KNEE ARTHROPLASTY;  Surgeon: Newt Minion, MD;  Location: [facility];  Service: Orthopedics;  Laterality: Left;    OB History    No data available       Home Medications    Prior to Admission medications   Medication Sig Start Date End Date Taking? Authorizing Provider  Albuterol Sulfate (VENTOLIN HFA IN) Inhale 2 puffs into the lungs every 4 (four) hours as needed (shortness of breath).     [provider]  Alpha-Lipoic Acid 300 MG CAPS Take 300 mg by mouth daily.    [provider]  aspirin 81 MG tablet Take 81 mg by mouth daily.    [provider]  BENICAR 40 MG tablet Take 40 mg by mouth daily with breakfast.  11/21/12   [provider]  BIOTIN PO Take 1 tablet by mouth  daily.    [provider]  CALCIUM PO Take 500 mg by mouth daily.     [provider]  Cholecalciferol (VITAMIN D) 2000 UNITS CAPS Take 2,000 Units by mouth 2 (two) times daily.    [provider]  fesoterodine (TOVIAZ) 8 MG TB24 tablet Take 8 mg by mouth daily.    [provider]  fluticasone (FLONASE) 50 MCG/ACT nasal spray Place 1 spray into both nostrils daily as needed for allergies or rhinitis.    [provider]  folic acid (FOLVITE) 1 MG tablet Take 1 mg by mouth daily.    [provider]  guaiFENesin (MUCINEX) 600 MG 12 hr tablet Take 600 mg by mouth as needed (congestion).     [provider]  MAGNESIUM PO Take 1 tablet by mouth daily as needed (500mg . each, muscle cramps, takes up to 3 tabs  (total) as needed).     [provider]  methotrexate (RHEUMATREX) 2.5 MG  tablet Take 10 mg by mouth once a week. On Fridays 03/07/14   [provider]  montelukast (SINGULAIR) 10 MG tablet Take 10 mg by mouth daily.  10/30/12   [provider]  Multiple Vitamins-Minerals (MULTIVITAMIN ADULT PO) Take by mouth 1 day or 1 dose.    [provider]  naproxen sodium (ANAPROX) 220 MG tablet Take 220 mg by mouth 3 (three) times daily as needed (pain).     [provider]  SYNTHROID 112 MCG tablet Take 112 mcg by mouth daily before breakfast.  09/03/14   [provider]    Family History Family History  Problem Relation Age of Onset  . Cancer Mother   . Heart attack Father   . Cancer Father   . Diabetes Maternal Grandmother     Social History Social History  Substance Use Topics  . Smoking status: Never Smoker  . Smokeless tobacco: Never Used  . Alcohol use No     Allergies   Neurontin [gabapentin]; Prednisone; Amlodipine besylate; Avapro [irbesartan]; Ciprofloxacin; Colesevelam; Furosemide; Lipitor [atorvastatin]; Myrbetriq [mirabegron]; Novocain [procaine]; Penicillins; Ranitidine; Sulfa antibiotics; Tizanidine; Tramadol; and Welchol [colesevelam hcl]   Review of Systems Review of Systems  Constitutional: Positive for fatigue. Negative for chills and fever.  Respiratory: Negative for cough, shortness of breath, wheezing and stridor.   Cardiovascular: Positive for leg swelling. Negative for chest pain and palpitations.  Gastrointestinal: Negative for abdominal pain, nausea and vomiting.  Genitourinary: Negative for dysuria and hematuria.  Skin: Positive for color change. Negative for pallor and rash.  Neurological: Negative for seizures, syncope, weakness and numbness.     Physical Exam Updated Vital Signs BP 127/87   Pulse 72   Temp 98.2 F (36.8 C) (Oral)   Resp 16   Ht 5\' 1"  (1.549 m)   Wt 111.6 kg (246 lb 0.5 oz)   SpO2 98%   BMI 46.49 kg/m   Physical Exam  Constitutional: She appears  well-developed and well-nourished. No distress.  HENT:  Head: Normocephalic and atraumatic.  Eyes: Conjunctivae are normal.  Neck: Normal range of motion. Neck supple.  Cardiovascular: Normal rate, regular rhythm, normal heart sounds and intact distal pulses.   No murmur heard. Shortly dorsalis pedis pulses  Pulmonary/Chest: Effort normal and breath sounds normal. No respiratory distress. She has no wheezes. She has no rales. She exhibits no tenderness.  Abdominal: She exhibits no distension.  Musculoskeletal: Normal range of motion. She exhibits edema and tenderness.  Neurological: She is alert.  Neurovascularly  intact distally.   Skin: Skin is warm and dry. She is not diaphoretic. There is erythema.  Psychiatric: She has a normal mood and affect.  Nursing note and vitals reviewed.    ED Treatments / Results  Labs (all labs ordered are listed, but only abnormal results are displayed) Labs Reviewed  COMPREHENSIVE METABOLIC PANEL - Abnormal; Notable for the following:       Result Value   Glucose, Bld 152 (*)    Albumin 3.4 (*)    All other components within normal limits  CULTURE, BLOOD (ROUTINE X 2)  CULTURE, BLOOD (ROUTINE X 2)  CBC WITH DIFFERENTIAL/PLATELET  I-STAT CG4 LACTIC ACID, ED  I-STAT CG4 LACTIC ACID, ED    EKG  EKG Interpretation None       Radiology No results found.  Procedures Procedures (including critical care time)  Medications Ordered in ED Medications  sodium chloride 0.9 % bolus 1,000 mL (not administered)  vancomycin (VANCOCIN) 2,000 mg in sodium chloride 0.9 % 500 mL IVPB (not administered)     Initial Impression / Assessment and Plan / ED Course  I have reviewed the triage vital signs and the nursing notes.  Pertinent labs & imaging results that were available during my care of the patient were reviewed by me and considered in my medical decision making (see chart for details).    Patient with history of lymphedema and diabetes  presenting with left lower extremity cellulitis. Non-circumferential. She has failed outpatient therapy. Treated for the past 10 days with keflex.  Will need to be admitted for IV antibiotics and monitoring  Vancomycin per pharmacy consult, iv fluids initiated.  Consulted for admission Spoke to dr. Blaine Hamper who will be admitting patient.  Final Clinical Impressions(s) / ED Diagnoses   Final diagnoses:  Left leg cellulitis  Hypothyroidism, unspecified type  Dermatomyositis ([place])  Mild asthma without complication, unspecified whether persistent    New Prescriptions New Prescriptions   No medications on file     Dossie Der 03/13/17 Caren Hazy, MD 03/13/17 [phone]

## 2017-03-12 NOTE — ED Triage Notes (Addendum)
Pt has been taking Keflex X10 days for cellulitis of left leg. Pt denies improvement. Pt seen by UC today and told to come here to have further treatment. Pt also diabetic. Leg is swollen, red, and warm to touch.

## 2017-03-13 ENCOUNTER — Encounter (HOSPITAL_COMMUNITY): Payer: Self-pay | Admitting: Internal Medicine

## 2017-03-13 ENCOUNTER — Observation Stay (HOSPITAL_COMMUNITY): Payer: Medicare Other

## 2017-03-13 DIAGNOSIS — J45909 Unspecified asthma, uncomplicated: Secondary | ICD-10-CM | POA: Diagnosis present

## 2017-03-13 DIAGNOSIS — I10 Essential (primary) hypertension: Secondary | ICD-10-CM | POA: Diagnosis not present

## 2017-03-13 DIAGNOSIS — L03116 Cellulitis of left lower limb: Secondary | ICD-10-CM | POA: Diagnosis not present

## 2017-03-13 DIAGNOSIS — E039 Hypothyroidism, unspecified: Secondary | ICD-10-CM | POA: Diagnosis present

## 2017-03-13 DIAGNOSIS — M3390 Dermatopolymyositis, unspecified, organ involvement unspecified: Secondary | ICD-10-CM | POA: Diagnosis present

## 2017-03-13 DIAGNOSIS — L02416 Cutaneous abscess of left lower limb: Secondary | ICD-10-CM

## 2017-03-13 HISTORY — DX: Cellulitis of left lower limb: L02.416

## 2017-03-13 LAB — I-STAT CG4 LACTIC ACID, ED: LACTIC ACID, VENOUS: 0.83 mmol/L (ref 0.5–1.9)

## 2017-03-13 LAB — BASIC METABOLIC PANEL
Anion gap: 8 (ref 5–15)
BUN: 13 mg/dL (ref 6–20)
CO2: 25 mmol/L (ref 22–32)
CREATININE: 0.54 mg/dL (ref 0.44–1.00)
Calcium: 9.6 mg/dL (ref 8.9–10.3)
Chloride: 107 mmol/L (ref 101–111)
GFR calc Af Amer: >60 mL/min (ref >60)
GLUCOSE: 97 mg/dL (ref 65–99)
Potassium: 3.8 mmol/L (ref 3.5–5.1)
SODIUM: 140 mmol/L (ref 135–145)

## 2017-03-13 LAB — CBC
HEMATOCRIT: 37.1 % (ref 36.0–46.0)
Hemoglobin: 12.3 g/dL (ref 12.0–15.0)
MCH: 28.9 pg (ref 26.0–34.0)
MCHC: 33.2 g/dL (ref 30.0–36.0)
MCV: 87.3 fL (ref 78.0–100.0)
PLATELETS: 301 10*3/uL (ref 150–400)
RBC: 4.25 MIL/uL (ref 3.87–5.11)
RDW: 13.1 % (ref 11.5–15.5)
WBC: 8.4 10*3/uL (ref 4.0–10.5)

## 2017-03-13 LAB — C-REACTIVE PROTEIN

## 2017-03-13 MED ORDER — ALBUTEROL SULFATE (2.5 MG/3ML) 0.083% IN NEBU: 2.5000 mg | INHALATION_SOLUTION | RESPIRATORY_TRACT | Status: DC | PRN

## 2017-03-13 MED ORDER — HYDRALAZINE HCL 20 MG/ML IJ SOLN: 5.0000 mg | INTRAMUSCULAR | Status: DC | PRN

## 2017-03-13 MED ORDER — LEVOTHYROXINE SODIUM 112 MCG PO TABS
112.0000 ug | ORAL_TABLET | Freq: Once | ORAL | Status: AC
  Administered 2017-03-13: 112 ug via ORAL
  Filled 2017-03-13: qty 1

## 2017-03-13 MED ORDER — HYDRALAZINE HCL 25 MG PO TABS
25.0000 mg | ORAL_TABLET | Freq: Three times a day (TID) | ORAL | Status: DC
  Filled 2017-03-13: qty 1

## 2017-03-13 MED ORDER — ZOLPIDEM TARTRATE 5 MG PO TABS: 5.0000 mg | ORAL_TABLET | Freq: Every evening | ORAL | Status: DC | PRN

## 2017-03-13 MED ORDER — VANCOMYCIN HCL IN DEXTROSE 750-5 MG/150ML-% IV SOLN
750.0000 mg | Freq: Two times a day (BID) | INTRAVENOUS | Status: DC
  Administered 2017-03-13 – 2017-03-15 (×5): 750 mg via INTRAVENOUS
  Filled 2017-03-13 (×6): qty 150

## 2017-03-13 MED ORDER — FLUTICASONE PROPIONATE 50 MCG/ACT NA SUSP: 1.0000 | Freq: Every day | NASAL | Status: DC | PRN

## 2017-03-13 MED ORDER — BIOTIN 5 MG PO CAPS: 1.0000 | ORAL_CAPSULE | Freq: Every day | ORAL | Status: DC

## 2017-03-13 MED ORDER — ACETAMINOPHEN 325 MG PO TABS
650.0000 mg | ORAL_TABLET | Freq: Four times a day (QID) | ORAL | Status: DC | PRN
  Administered 2017-03-13 – 2017-03-14 (×3): 650 mg via ORAL
  Filled 2017-03-13 (×3): qty 2

## 2017-03-13 MED ORDER — GUAIFENESIN ER 600 MG PO TB12: 600.0000 mg | ORAL_TABLET | ORAL | Status: DC | PRN

## 2017-03-13 MED ORDER — VITAMIN D 1000 UNITS PO TABS
2000.0000 [IU] | ORAL_TABLET | Freq: Two times a day (BID) | ORAL | Status: DC
  Administered 2017-03-13 – 2017-03-15 (×4): 2000 [IU] via ORAL
  Filled 2017-03-13 (×6): qty 2

## 2017-03-13 MED ORDER — BACLOFEN 5 MG HALF TABLET
5.0000 mg | ORAL_TABLET | Freq: Two times a day (BID) | ORAL | Status: DC
  Administered 2017-03-13: 5 mg via ORAL
  Filled 2017-03-13 (×6): qty 1

## 2017-03-13 MED ORDER — OLMESARTAN MEDOXOMIL 40 MG PO TABS
40.0000 mg | ORAL_TABLET | Freq: Every day | ORAL | Status: DC
  Administered 2017-03-13 – 2017-03-14 (×2): 40 mg via ORAL
  Filled 2017-03-13 (×2): qty 1

## 2017-03-13 MED ORDER — ASPIRIN 81 MG PO CHEW
81.0000 mg | CHEWABLE_TABLET | Freq: Every day | ORAL | Status: DC
  Administered 2017-03-13 – 2017-03-15 (×3): 81 mg via ORAL
  Filled 2017-03-13 (×3): qty 1

## 2017-03-13 MED ORDER — FESOTERODINE FUMARATE ER 8 MG PO TB24
8.0000 mg | ORAL_TABLET | Freq: Every day | ORAL | Status: DC
  Administered 2017-03-13 – 2017-03-14 (×2): 8 mg via ORAL
  Filled 2017-03-13 (×3): qty 1

## 2017-03-13 MED ORDER — CALCIUM CARBONATE 1250 (500 CA) MG PO TABS
1.0000 | ORAL_TABLET | Freq: Every day | ORAL | Status: DC
  Administered 2017-03-13 – 2017-03-15 (×2): 500 mg via ORAL
  Filled 2017-03-13 (×3): qty 1

## 2017-03-13 MED ORDER — ONDANSETRON HCL 4 MG/2ML IJ SOLN: 4.0000 mg | Freq: Three times a day (TID) | INTRAMUSCULAR | Status: DC | PRN

## 2017-03-13 MED ORDER — ALPHA-LIPOIC ACID 300 MG PO CAPS: 300.0000 mg | ORAL_CAPSULE | Freq: Every day | ORAL | Status: DC

## 2017-03-13 MED ORDER — ENOXAPARIN SODIUM 40 MG/0.4ML ~~LOC~~ SOLN
40.0000 mg | SUBCUTANEOUS | Status: DC
  Administered 2017-03-13: 40 mg via SUBCUTANEOUS
  Filled 2017-03-13 (×2): qty 0.4

## 2017-03-13 MED ORDER — ADULT MULTIVITAMIN W/MINERALS CH
1.0000 | ORAL_TABLET | Freq: Every day | ORAL | Status: DC
  Administered 2017-03-13 – 2017-03-14 (×2): 1 via ORAL
  Filled 2017-03-13 (×3): qty 1

## 2017-03-13 MED ORDER — MONTELUKAST SODIUM 10 MG PO TABS
10.0000 mg | ORAL_TABLET | Freq: Every day | ORAL | Status: DC
  Administered 2017-03-13 – 2017-03-15 (×3): 10 mg via ORAL
  Filled 2017-03-13 (×3): qty 1

## 2017-03-13 MED ORDER — MAGNESIUM OXIDE 400 (241.3 MG) MG PO TABS
200.0000 mg | ORAL_TABLET | Freq: Three times a day (TID) | ORAL | Status: DC
  Administered 2017-03-13 (×3): 200 mg via ORAL
  Filled 2017-03-13 (×3): qty 1

## 2017-03-13 MED ORDER — NAPROXEN 250 MG PO TABS: 250.0000 mg | ORAL_TABLET | Freq: Three times a day (TID) | ORAL | Status: DC | PRN

## 2017-03-13 MED ORDER — LEVOTHYROXINE SODIUM 112 MCG PO TABS
112.0000 ug | ORAL_TABLET | Freq: Every day | ORAL | Status: DC
  Administered 2017-03-13 – 2017-03-14 (×3): 112 ug via ORAL
  Filled 2017-03-13 (×2): qty 1

## 2017-03-13 MED ORDER — FOLIC ACID 1 MG PO TABS
1.0000 mg | ORAL_TABLET | Freq: Every day | ORAL | Status: DC
  Administered 2017-03-13 – 2017-03-15 (×2): 1 mg via ORAL
  Filled 2017-03-13 (×3): qty 1

## 2017-03-13 NOTE — Progress Notes (Signed)
Patient refused her morning Hydralazine this am.  She requested her Benicar.  Notified on-call via [facility].  Informed day shift nurse to follow up.

## 2017-03-13 NOTE — H&P (Signed)
History and Physical    Janice [name] GOT:[id number] DOB: [birth year] DOA: 03/12/2017  Referring MD/NP/PA:   PCP: Carol Ada, MD   Patient coming from:  The patient is coming from home.  At baseline, pt is independent for most of ADL.   Chief Complaint: Left leg pain and swelling  HPI: Janice [name] is a 76 y.o. female with medical history significant of hypertension, asthma, GERD, hypothyroidism, trigeminal neuralgia, right carotid artery stenosis, OSA on CPAP, spinal stenosis, anemia, bilateral lower leg lymphedema, dermatomyositis, who presents with left lower leg pain.  Patient states that she has left lower leg infection, which has been going on for almost 2 weeks. She has been taking oral Keflex in the past to 10 days without improvement. Her left lower leg is swelling, tender and erythematous, which has been progressively getting worse. The pain is constant, sharp, can reach up to 8 out of 10 in severity, currently pain is mild, nonradiating. No fever, chills. Denies chest pain, SOB, cough, nausea, vomiting, diarrhea, abdominal pain or symptoms of UTI. Patient states that she has chronic muscle spasm issue, which has not changed recently. She is taking magnesium by mouth for muscle spasm.  ED Course: pt was found to have have WBC 8.7, lactic acid 1.61, electrolytes renal function okay, temperature normal, slightly tachycardia, oxygen saturation 97% on room air. Patient is placed on MedSurg bed for observation  Review of Systems:   General: no fevers, chills, no body weight gain, has fatigue HEENT: no blurry vision, hearing changes or sore throat Respiratory: no dyspnea, coughing, wheezing CV: no chest pain, no palpitations GI: no nausea, vomiting, abdominal pain, diarrhea, constipation GU: no dysuria, burning on urination, increased urinary frequency, hematuria  Ext: has leg edema and left leg pain, swelling and erythema  Neuro: no unilateral weakness, numbness, or tingling,  no vision change or hearing loss Skin: no rash, no skin tear. MSK: No muscle spasm, no deformity, no limitation of range of movement in spin Heme: No easy bruising.  Travel history: No recent long distant travel.  Allergy:  Allergies  Allergen Reactions  . Neurontin [Gabapentin] Other (See Comments)    confusion Muscle jerking  . Prednisone Anaphylaxis    psychosis  . Amlodipine Besylate Other (See Comments)    Muscle weakness  . Avapro [Irbesartan] Other (See Comments)    Muscle weakness, fatigue -- States she can only take Benicar Muscle weakness, fatigue -- States she can only take Benicar  . Ciprofloxacin Other (See Comments)    Confusion w/psychosis  . Colesevelam Other (See Comments)    Decreased energy   . Furosemide Other (See Comments)    Muscle weakness  . Lipitor [Atorvastatin] Other (See Comments)    Muscle cramps/ muscle weakness Muscle weakness  . Myrbetriq [Mirabegron] Other (See Comments)    Blurred vision   . Novocain [Procaine]     Does not work  . Penicillins Hives  . Ranitidine     Pt. Knows that it can cause: Confusion & agitation, she is not  willing to take the risk of taking this med.   . Sulfa Antibiotics Other (See Comments)    swelling  . Tizanidine     Confusion   . Tramadol Other (See Comments)    Confusion w/psychosis  . Welchol [Colesevelam Hcl] Other (See Comments)    Muscle weakness     Past Medical History:  Diagnosis Date  . Anemia    rec'd her own blood in the past postop  x2   . Arthritis    OSA- knees & shoulders   . Asthma   . 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 Family history of anesthesia complication    SISTER WAKES UP FIGHTING "  . Fibromyalgia    perhaps, not sure    . GERD (gastroesophageal reflux disease)   . Headache    migraine history  . Heart murmur    echo 09-very mild  . Hx of appendectomy   . Hypertension, benign   . Hypothyroidism   . Muscular weakness   .  Sciatic pain   . Sleep apnea    sleep study done /w Dr. Maxwell Caul, uses CPAP q night   . Spinal stenosis   . Stenosis of right carotid artery   . Trigeminal neuralgia   . Wears glasses     Past Surgical History:  Procedure Laterality Date  . APPENDECTOMY    . CHOLECYSTECTOMY  04/12/2014   DR BLACMAN  . CHOLECYSTECTOMY N/A 04/12/2014   Procedure: LAPAROSCOPIC CHOLECYSTECTOMY;  Surgeon: Coralie Keens, MD;  Location: [facility];  Service: General;  Laterality: N/A;  . EYE SURGERY     cataracts removed, /w IOL - bilateral   . LUMBAR FUSION  2004  . SHOULDER SURGERY  2000   right  . TONSILLECTOMY    . TOTAL HIP ARTHROPLASTY  2005   right  . TOTAL KNEE ARTHROPLASTY  2001   right  . TOTAL KNEE ARTHROPLASTY Left 02/01/2015  . TOTAL KNEE ARTHROPLASTY Left 02/01/2015   Procedure: LEFT TOTAL KNEE ARTHROPLASTY;  Surgeon: Newt Minion, MD;  Location: [facility];  Service: Orthopedics;  Laterality: Left;    Social History:  reports that she has never smoked. She has never used smokeless tobacco. She reports that she does not drink alcohol or use drugs.  Family History:  Family History  Problem Relation Age of Onset  . Cancer Mother   . Heart attack Father   . Cancer Father   . Diabetes Maternal Grandmother      Prior to Admission medications   Medication Sig Start Date End Date Taking? Authorizing Provider  Albuterol Sulfate (VENTOLIN HFA IN) Inhale 2 puffs into the lungs every 4 (four) hours as needed (shortness of breath).     [provider]  Alpha-Lipoic Acid 300 MG CAPS Take 300 mg by mouth daily.    [provider]  aspirin 81 MG tablet Take 81 mg by mouth daily.    [provider]  BENICAR 40 MG tablet Take 40 mg by mouth daily with breakfast.  11/21/12   [provider]  BIOTIN PO Take 1 tablet by mouth daily.    [provider]  CALCIUM PO Take 500 mg by mouth daily.     [provider]  Cholecalciferol (VITAMIN D) 2000 UNITS CAPS  Take 2,000 Units by mouth 2 (two) times daily.    [provider]  fesoterodine (TOVIAZ) 8 MG TB24 tablet Take 8 mg by mouth daily.    [provider]  fluticasone (FLONASE) 50 MCG/ACT nasal spray Place 1 spray into both nostrils daily as needed for allergies or rhinitis.    [provider]  folic acid (FOLVITE) 1 MG tablet Take 1 mg by mouth daily.    [provider]  guaiFENesin (MUCINEX) 600 MG 12 hr tablet Take 600 mg by mouth as needed (congestion).     [provider]  MAGNESIUM PO Take  1 tablet by mouth daily as needed (528m. each, muscle cramps, takes up to 3 tabs  (total) as needed).     [provider]  methotrexate (RHEUMATREX) 2.5 MG tablet Take 10 mg by mouth once a week. On Fridays 03/07/14   [provider]  montelukast (SINGULAIR) 10 MG tablet Take 10 mg by mouth daily.  10/30/12   [provider]  Multiple Vitamins-Minerals (MULTIVITAMIN ADULT PO) Take by mouth 1 day or 1 dose.    [provider]  naproxen sodium (ANAPROX) 220 MG tablet Take 220 mg by mouth 3 (three) times daily as needed (pain).     [provider]  SYNTHROID 112 MCG tablet Take 112 mcg by mouth daily before breakfast.  09/03/14   [provider]    Physical Exam: Vitals:   03/13/17 0015 03/13/17 0100 03/13/17 0216 03/13/17 0345  BP: 127/87 (!) 174/83 (!) 165/76   Pulse: 72 66 74 71  Resp: _0 Temp:   97.8 F (36.6 C)   TempSrc:   Oral   SpO2: 98% 100% 99% 99%  Weight:      Height:       General: Not in acute distress HEENT:       Eyes: PERRL, EOMI, no scleral icterus.       ENT: No discharge from the ears and nose, no pharynx injection, no tonsillar enlargement.        Neck: No JVD, no bruit, no mass felt. Heme: No neck lymph node enlargement. Cardiac: S1/S2, RRR, has 1/6 systolic murmurs, No gallops or rubs. Respiratory: No rales, wheezing, rhonchi or rubs. GI: Soft, nondistended, nontender, no  rebound pain, no organomegaly, BS present. GU: No hematuria Ext: 2+DP/PT pulse bilaterally.The patient has bilateral lower leg lymphedema with 3+ edema. Left leg is swelling, tender, warm, erythematous Musculoskeletal: No joint deformities, No joint redness or warmth, no limitation of ROM in spin. Skin: No rashes.  Neuro: Alert, oriented X3, cranial nerves II-XII grossly intact, moves all extremities normally.  Psych: Patient is not psychotic, no suicidal or hemocidal ideation.  Labs on Admission: I have personally reviewed following labs and imaging studies  CBC:  Recent Labs Lab 03/12/17 1825 03/13/17 0342  WBC 8.7 8.4  NEUTROABS 5.9  --   HGB 12.3 12.3  HCT 37.0 37.1  MCV 86.9 87.3  PLT 322 3280  Basic Metabolic Panel:  Recent Labs Lab 03/12/17 1825 03/13/17 0342  NA 137 140  K 3.9 3.8  CL 104 107  CO2 24 25  GLUCOSE 152* 97  BUN 15 13  CREATININE 0.68 0.54  CALCIUM 9.5 9.6   GFR: Estimated Creatinine Clearance: 68.1 mL/min (by C-G formula based on SCr of 0.54 mg/dL). Liver Function Tests:  Recent Labs Lab 03/12/17 1825  AST 18  ALT 16  ALKPHOS 75  BILITOT 0.4  PROT 7.0  ALBUMIN 3.4*   No results for input(s): LIPASE, AMYLASE in the last 168 hours. No results for input(s): AMMONIA in the last 168 hours. Coagulation Profile: No results for input(s): INR, PROTIME in the last 168 hours. Cardiac Enzymes: No results for input(s): CKTOTAL, CKMB, CKMBINDEX, TROPONINI in the last 168 hours. BNP (last 3 results) No results for input(s): PROBNP in the last 8760 hours. HbA1C: No results for input(s): HGBA1C in the last 72 hours. CBG: No results for input(s): GLUCAP in the last 168 hours. Lipid Profile: No results for input(s): CHOL, HDL, LDLCALC, TRIG, CHOLHDL, LDLDIRECT in the last  72 hours. Thyroid Function Tests: No results for input(s): TSH, T4TOTAL, FREET4, T3FREE, THYROIDAB in the last 72 hours. Anemia Panel: No results for input(s): VITAMINB12,  FOLATE, FERRITIN, TIBC, IRON, RETICCTPCT in the last 72 hours. Urine analysis: No results found for: COLORURINE, APPEARANCEUR, LABSPEC, PHURINE, GLUCOSEU, HGBUR, BILIRUBINUR, KETONESUR, PROTEINUR, UROBILINOGEN, NITRITE, LEUKOCYTESUR Sepsis Labs: _0 (procalcitonin:4,lacticidven:4) )No results found for this or any previous visit (from the past 240 hour(s)).   Radiological Exams on Admission: No results found.   EKG: Not done in ED.   Assessment/Plan Principal Problem:   Cellulitis of left leg Active Problems:   Hypothyroidism   Dermatomyositis (HCC)   Asthma   Essential hypertension   Cellulitis of left leg in the setting of chronic lymphedema: pt is not septic. No fever or leukocytosis. Lactic acid normal. Hemodynamically stable.  - will place on tele bed for obs - Empiric antimicrobial treatment with vancomycin - PRN Zofran for nausea, naproxen and tyelnol for pain - Blood cultures x 2  - ESR and CRP - IVF: 1 L of NS bolus in ED - LE doppler to r/o DVT  Hypothyroidism: Last TSH was 1.018 on 12/22/12 -Continue home Synthroid  Dermatomyositis ([place]): pt  Used to be on methotrexate. She has been followed By Dr. Larose Kells in [facility]. She started taking methotrexate 3 months ago -Follow up with Dr. Larose Kells in [place] hypertension: Blood pressure 141/110 -IV hydralazine when necessary -pt is on Benicar at home, but pt is allergic to [place], cannot switch. -add oral hydralazine 25 mg tid  Asthma: stable -prn albuerol nebs   DVT ppx:  SQ Lovenox Code Status: Full code Family Communication: None at bed side.   Disposition Plan:  Anticipate discharge back to previous home environment Consults called: none Admission status:  medical floor/obs    Date of Service 03/13/2017    Ivor Costa [facility] Pager [phone]  If 7PM-7AM, please contact night-coverage [email or web address] Password [email or web address] 03/13/2017, 5:29 AM

## 2017-03-13 NOTE — Progress Notes (Signed)
Patient seen and evaluated earlier this a.m. by my associate. She is currently being treated for left lower extremity cellulitis which did not improve despite outpatient therapy. Continue current IV antibiotics.  Gen.: Patient in no acute distress, alert and awake Cardiovascular: No cyanosis Pulmonary: Equal chest rise, no increased work of breathing  Plan on reassessing next a.m. Patient refuses hydralazine would like to continue Benicar. Consult at pharmacy for assistance with that. She is allergic to replacement ARB which the hospital uses in place of Benicar.  Valetta Mulroy, [employer]

## 2017-03-13 NOTE — Progress Notes (Signed)
Refused her Hydralazine, requested Benicar,states that is all she takes at home

## 2017-03-13 NOTE — Progress Notes (Signed)
Lab called.  Need to recheck ESR collection b/c of blood clot found in sample. Patient has adamantly refused to allow lab to re-check.  Two RN's spoke with patient.  Patient refuses.  Will have MD to speak with patient.

## 2017-03-13 NOTE — Progress Notes (Signed)
Patient not ready for CPAP at this time. Stated they would place themselves on later and would call if any issue arise. H20 added to humidifier and setting set per patient comfort and home settings. Patient can place mask on and off herself.

## 2017-03-13 NOTE — Progress Notes (Signed)
Pharmacy Antibiotic Note  Janice [name] is a 76 y.o. female admitted on 03/12/2017 with cellulitis.  Pharmacy has been consulted for vancomycin dosing.  Plan: Vancomycin 2000mg  x1 then 750mg  IV every 12 hours.  Goal trough 10-15 mcg/mL.  Height: 5\' 1"  (154.9 cm) Weight: 246 lb 0.5 oz (111.6 kg) IBW/kg (Calculated) : 47.8  Temp (24hrs), Avg:98.2 F (36.8 C), Min:98.2 F (36.8 C), Max:98.2 F (36.8 C)   Recent Labs Lab 03/12/17 1825 03/12/17 1840 03/13/17 0019  WBC 8.7  --   --   CREATININE 0.68  --   --   LATICACIDVEN  --  1.61 0.83    Estimated Creatinine Clearance: 68.1 mL/min (by C-G formula based on SCr of 0.68 mg/dL).    Allergies  Allergen Reactions  . Neurontin [Gabapentin] Other (See Comments)    confusion Muscle jerking  . Prednisone Anaphylaxis    psychosis  . Amlodipine Besylate Other (See Comments)    Muscle weakness  . Avapro [Irbesartan] Other (See Comments)    Muscle weakness, fatigue -- States she can only take Benicar Muscle weakness, fatigue -- States she can only take Benicar  . Ciprofloxacin Other (See Comments)    Confusion w/psychosis  . Colesevelam Other (See Comments)    Decreased energy   . Furosemide Other (See Comments)    Muscle weakness  . Lipitor [Atorvastatin] Other (See Comments)    Muscle cramps/ muscle weakness Muscle weakness  . Myrbetriq [Mirabegron] Other (See Comments)    Blurred vision   . Novocain [Procaine]     Does not work  . Penicillins Hives  . Ranitidine     Pt. Knows that it can cause: Confusion & agitation, she is not  willing to take the risk of taking this med.   . Sulfa Antibiotics Other (See Comments)    swelling  . Tizanidine     Confusion   . Tramadol Other (See Comments)    Confusion w/psychosis  . Welchol [Colesevelam Hcl] Other (See Comments)    Muscle weakness      Thank you for allowing pharmacy to be a part of this patient's care.  Wynona Neat, PharmD, BCPS  03/13/2017 1:03 AM

## 2017-03-14 ENCOUNTER — Inpatient Hospital Stay (HOSPITAL_COMMUNITY): Payer: Medicare Other

## 2017-03-14 DIAGNOSIS — E119 Type 2 diabetes mellitus without complications: Secondary | ICD-10-CM | POA: Diagnosis present

## 2017-03-14 DIAGNOSIS — J45909 Unspecified asthma, uncomplicated: Secondary | ICD-10-CM | POA: Diagnosis present

## 2017-03-14 DIAGNOSIS — Z881 Allergy status to other antibiotic agents status: Secondary | ICD-10-CM | POA: Diagnosis not present

## 2017-03-14 DIAGNOSIS — M3313 Other dermatomyositis without myopathy: Secondary | ICD-10-CM | POA: Diagnosis present

## 2017-03-14 DIAGNOSIS — Z888 Allergy status to other drugs, medicaments and biological substances status: Secondary | ICD-10-CM | POA: Diagnosis not present

## 2017-03-14 DIAGNOSIS — M7989 Other specified soft tissue disorders: Secondary | ICD-10-CM | POA: Diagnosis not present

## 2017-03-14 DIAGNOSIS — I10 Essential (primary) hypertension: Secondary | ICD-10-CM | POA: Diagnosis present

## 2017-03-14 DIAGNOSIS — Z981 Arthrodesis status: Secondary | ICD-10-CM | POA: Diagnosis not present

## 2017-03-14 DIAGNOSIS — M62838 Other muscle spasm: Secondary | ICD-10-CM | POA: Diagnosis present

## 2017-03-14 DIAGNOSIS — Z96652 Presence of left artificial knee joint: Secondary | ICD-10-CM | POA: Diagnosis present

## 2017-03-14 DIAGNOSIS — L03116 Cellulitis of left lower limb: Secondary | ICD-10-CM | POA: Diagnosis present

## 2017-03-14 DIAGNOSIS — Z833 Family history of diabetes mellitus: Secondary | ICD-10-CM | POA: Diagnosis not present

## 2017-03-14 DIAGNOSIS — E039 Hypothyroidism, unspecified: Secondary | ICD-10-CM | POA: Diagnosis present

## 2017-03-14 DIAGNOSIS — Z9989 Dependence on other enabling machines and devices: Secondary | ICD-10-CM | POA: Diagnosis not present

## 2017-03-14 DIAGNOSIS — K219 Gastro-esophageal reflux disease without esophagitis: Secondary | ICD-10-CM | POA: Diagnosis present

## 2017-03-14 DIAGNOSIS — Z880 Allergy status to penicillin: Secondary | ICD-10-CM | POA: Diagnosis not present

## 2017-03-14 DIAGNOSIS — G4733 Obstructive sleep apnea (adult) (pediatric): Secondary | ICD-10-CM | POA: Diagnosis present

## 2017-03-14 LAB — BLOOD CULTURE ID PANEL (REFLEXED)
ACINETOBACTER BAUMANNII: NOT DETECTED
CANDIDA ALBICANS: NOT DETECTED
CANDIDA PARAPSILOSIS: NOT DETECTED
Candida glabrata: NOT DETECTED
Candida krusei: NOT DETECTED
Candida tropicalis: NOT DETECTED
Carbapenem resistance: NOT DETECTED
ENTEROBACTER CLOACAE COMPLEX: NOT DETECTED
ENTEROBACTERIACEAE SPECIES: NOT DETECTED
ENTEROCOCCUS SPECIES: NOT DETECTED
Escherichia coli: NOT DETECTED
Haemophilus influenzae: NOT DETECTED
KLEBSIELLA OXYTOCA: NOT DETECTED
KLEBSIELLA PNEUMONIAE: NOT DETECTED
Listeria monocytogenes: NOT DETECTED
METHICILLIN RESISTANCE: DETECTED — AB
NEISSERIA MENINGITIDIS: NOT DETECTED
PSEUDOMONAS AERUGINOSA: NOT DETECTED
Proteus species: NOT DETECTED
STAPHYLOCOCCUS AUREUS BCID: NOT DETECTED
STAPHYLOCOCCUS SPECIES: DETECTED — AB
STREPTOCOCCUS PNEUMONIAE: NOT DETECTED
Serratia marcescens: NOT DETECTED
Streptococcus agalactiae: NOT DETECTED
Streptococcus pyogenes: NOT DETECTED
Streptococcus species: NOT DETECTED
VANCOMYCIN RESISTANCE: NOT DETECTED

## 2017-03-14 MED ORDER — LEVOTHYROXINE SODIUM 112 MCG PO TABS: 112.0000 ug | ORAL_TABLET | Freq: Every day | ORAL | Status: DC

## 2017-03-14 MED ORDER — FESOTERODINE FUMARATE ER 8 MG PO TB24
8.0000 mg | ORAL_TABLET | Freq: Every day | ORAL | Status: DC
  Administered 2017-03-15: 8 mg via ORAL
  Filled 2017-03-14: qty 1

## 2017-03-14 MED ORDER — MAGNESIUM OXIDE 400 (241.3 MG) MG PO TABS
400.0000 mg | ORAL_TABLET | Freq: Three times a day (TID) | ORAL | Status: DC | PRN
  Filled 2017-03-14 (×3): qty 1

## 2017-03-14 MED ORDER — LEVOTHYROXINE SODIUM 112 MCG PO TABS
112.0000 ug | ORAL_TABLET | Freq: Every day | ORAL | Status: DC
  Administered 2017-03-15: 112 ug via ORAL
  Filled 2017-03-14 (×2): qty 1

## 2017-03-14 MED ORDER — OLMESARTAN MEDOXOMIL 40 MG PO TABS
40.0000 mg | ORAL_TABLET | Freq: Every day | ORAL | Status: DC
  Administered 2017-03-15: 40 mg via ORAL
  Filled 2017-03-14: qty 1

## 2017-03-14 NOTE — Progress Notes (Signed)
Discussed medications with patient at bedside. Changed medication administration times in EPIC. Patient given magnesium tablet to keep at bedside , she is to alert RN when she takes one so that it can be charted and the please replace PRN tablet at bedside from pyxis as she can take up to three times a day.  Patient is eager to go home and with improvement in cellulitis could switch to PO abx potentially later today or tomorrow morning.  Elenor Quinones, PharmD, BCPS Clinical Pharmacist Pager [phone] 03/14/2017 11:11 AM

## 2017-03-14 NOTE — Progress Notes (Signed)
Pt stated she did not need assistance putting her CPAP on  once she was ready to go to bed.  Her settings auto-titrate from 8-16 CMH2O. Pt advised to called if she needed anything.

## 2017-03-14 NOTE — Progress Notes (Signed)
*  PRELIMINARY RESULTS* Vascular Ultrasound Bilateral lower extremity venous duplex has been completed.  Preliminary findings: The visualized veins of the lower extremities appear negative for deep vein thrombosis.  Very limited exam due to patients tolerance of compression.  Negative for bakers cysts bilaterally.  Everrett Coombe 03/14/2017, 2:03 PM

## 2017-03-14 NOTE — Progress Notes (Signed)
PROGRESS NOTE    Janice [name]  LSL:[id number] DOB: 09-14-1940 DOA: 03/12/2017 PCP: Carol Ada, MD    Brief Narrative:  76 year old presenting to the hospital after failing outpatient treatment for her left lower extremity cellulitis   Assessment & Plan:   Principal Problem:   Cellulitis of left leg -Continue current IV antibiotics, will plan on transitioning to clindamycin next a.m.  Active Problems:   Hypothyroidism - No hypothyroid symptomatology reported. Continue current Synthroid regimen. Stable    Dermatomyositis ([place])   Asthma - Stable currently no asthma exacerbation. Continue when necessary albuterol    Essential hypertension   - Continue current regimen.  Muscle spasm - Patient reports taking magnesium which has helped her. Discussed with pharmacist assisted me in place and patient dose which helps her with her spasms.  DVT prophylaxis: Lovenox Code Status: Full Family Communication:d/c patient directly Disposition Plan: Discharge next a.m.   Consultants:   None   Procedures: None   Antimicrobials: Vancomycin   Subjective: Patient is very particular about what medications she takes. Would like medications administered at particular times. I have had pharmacist discussed with patient who will adjust medication regimen per patient's preference.  Objective: Vitals:   03/13/17 1438 03/13/17 2057 03/14/17 0522 03/14/17 1514  BP: (!) 165/81 (!) 164/86 (!) 166/63 (!) 150/73  Pulse: 76 76 62 79  Resp: 18 17 17 18   Temp: (!) 97 F (36.1 C) 98.3 F (36.8 C) 97.7 F (36.5 C) 98.4 F (36.9 C)  TempSrc: Oral Oral Oral Oral  SpO2: 99% 100% 100% 99%  Weight:      Height:        Intake/Output Summary (Last 24 hours) at 03/14/17 1859 Last data filed at 03/14/17 1700  Gross per 24 hour  Intake              910 ml  Output                0 ml  Net              910 ml   Filed Weights   03/12/17 2300  Weight: 111.6 kg (246 lb 0.5 oz)     Examination:  General exam: Appears calm and comfortable , No acute distress Respiratory system: Clear to auscultation. Respiratory effort normal. Cardiovascular system: S1 & S2 heard, RRR. No JVD, murmurs, rubs, gallops or clicks. pedal edema. Gastrointestinal system: Abdomen is nondistended, soft and nontender. No organomegaly or masses felt. Normal bowel sounds heard. Central nervous system: Alert and oriented. No focal neurological deficits. Extremities: Symmetric 5 x 5 power. Skin: Left lower extremity cellulitis. No induration Psychiatry: Judgement and insight appear normal. Mood & affect appropriate.     Data Reviewed: I have personally reviewed following labs and imaging studies  CBC:  Recent Labs Lab 03/12/17 1825 03/13/17 0342  WBC 8.7 8.4  NEUTROABS 5.9  --   HGB 12.3 12.3  HCT 37.0 37.1  MCV 86.9 87.3  PLT 322 115   Basic Metabolic Panel:  Recent Labs Lab 03/12/17 1825 03/13/17 0342  NA 137 140  K 3.9 3.8  CL 104 107  CO2 24 25  GLUCOSE 152* 97  BUN 15 13  CREATININE 0.68 0.54  CALCIUM 9.5 9.6   GFR: Estimated Creatinine Clearance: 68.1 mL/min (by C-G formula based on SCr of 0.54 mg/dL). Liver Function Tests:  Recent Labs Lab 03/12/17 1825  AST 18  ALT 16  ALKPHOS 75  BILITOT 0.4  PROT 7.0  ALBUMIN 3.4*   No results for input(s): LIPASE, AMYLASE in the last 168 hours. No results for input(s): AMMONIA in the last 168 hours. Coagulation Profile: No results for input(s): INR, PROTIME in the last 168 hours. Cardiac Enzymes: No results for input(s): CKTOTAL, CKMB, CKMBINDEX, TROPONINI in the last 168 hours. BNP (last 3 results) No results for input(s): PROBNP in the last 8760 hours. HbA1C: No results for input(s): HGBA1C in the last 72 hours. CBG: No results for input(s): GLUCAP in the last 168 hours. Lipid Profile: No results for input(s): CHOL, HDL, LDLCALC, TRIG, CHOLHDL, LDLDIRECT in the last 72 hours. Thyroid Function Tests: No  results for input(s): TSH, T4TOTAL, FREET4, T3FREE, THYROIDAB in the last 72 hours. Anemia Panel: No results for input(s): VITAMINB12, FOLATE, FERRITIN, TIBC, IRON, RETICCTPCT in the last 72 hours. Sepsis Labs:  Recent Labs Lab 03/12/17 1840 03/13/17 0019  LATICACIDVEN 1.61 0.83    Recent Results (from the past 240 hour(s))  Culture, blood (routine x 2)     Status: None (Preliminary result)   Collection Time: 03/12/17 11:57 PM  Result Value Ref Range Status   Specimen Description BLOOD LEFT ANTECUBITAL  Final   Special Requests   Final    BOTTLES DRAWN AEROBIC AND ANAEROBIC Blood Culture adequate volume   Culture  Setup Time   Final    GRAM POSITIVE COCCI IN CLUSTERS ANAEROBIC BOTTLE ONLY CRITICAL RESULT CALLED TO, READ BACK BY AND VERIFIED WITH: J.LEDFORD, PHARMD 03/14/17 0215 L.CHAMPION    Culture GRAM POSITIVE COCCI IN CLUSTERS  Final   Report Status PENDING  Incomplete  Blood Culture ID Panel (Reflexed)     Status: Abnormal   Collection Time: 03/12/17 11:57 PM  Result Value Ref Range Status   Enterococcus species NOT DETECTED NOT DETECTED Final   Vancomycin resistance NOT DETECTED NOT DETECTED Final   Listeria monocytogenes NOT DETECTED NOT DETECTED Final   Staphylococcus species DETECTED (A) NOT DETECTED Final    Comment: CRITICAL RESULT CALLED TO, READ BACK BY AND VERIFIED WITH: J.LEDFORD, PHARMD 03/14/17 0215 L.CHAMPION    Staphylococcus aureus NOT DETECTED NOT DETECTED Final   Methicillin resistance DETECTED (A) NOT DETECTED Final    Comment: CRITICAL RESULT CALLED TO, READ BACK BY AND VERIFIED WITH: J.LEDFORD, PHARMD 03/14/17 0215 L.CHAMPION    Streptococcus species NOT DETECTED NOT DETECTED Final   Streptococcus agalactiae NOT DETECTED NOT DETECTED Final   Streptococcus pneumoniae NOT DETECTED NOT DETECTED Final   Streptococcus pyogenes NOT DETECTED NOT DETECTED Final   Acinetobacter baumannii NOT DETECTED NOT DETECTED Final   Enterobacteriaceae species NOT  DETECTED NOT DETECTED Final   Enterobacter cloacae complex NOT DETECTED NOT DETECTED Final   Escherichia coli NOT DETECTED NOT DETECTED Final   Klebsiella oxytoca NOT DETECTED NOT DETECTED Final   Klebsiella pneumoniae NOT DETECTED NOT DETECTED Final   Proteus species NOT DETECTED NOT DETECTED Final   Serratia marcescens NOT DETECTED NOT DETECTED Final   Carbapenem resistance NOT DETECTED NOT DETECTED Final   Haemophilus influenzae NOT DETECTED NOT DETECTED Final   Neisseria meningitidis NOT DETECTED NOT DETECTED Final   Pseudomonas aeruginosa NOT DETECTED NOT DETECTED Final   Candida albicans NOT DETECTED NOT DETECTED Final   Candida glabrata NOT DETECTED NOT DETECTED Final   Candida krusei NOT DETECTED NOT DETECTED Final   Candida parapsilosis NOT DETECTED NOT DETECTED Final   Candida tropicalis NOT DETECTED NOT DETECTED Final  Culture, blood (routine x 2)     Status: None (Preliminary result)   Collection  Time: 03/13/17 12:11 AM  Result Value Ref Range Status   Specimen Description BLOOD LEFT WRIST  Final   Special Requests   Final    BOTTLES DRAWN AEROBIC AND ANAEROBIC Blood Culture adequate volume   Culture NO GROWTH 1 DAY  Final   Report Status PENDING  Incomplete         Radiology Studies: No results found.      Scheduled Meds: . aspirin  81 mg Oral Daily  . baclofen  5 mg Oral BID  . calcium carbonate  1 tablet Oral Daily  . cholecalciferol  2,000 Units Oral BID  . enoxaparin (LOVENOX) injection  40 mg Subcutaneous Q24H  . [START ON 03/15/2017] fesoterodine  8 mg Oral Daily  . folic acid  1 mg Oral Daily  . levothyroxine  112 mcg Oral QAC breakfast  . montelukast  10 mg Oral Daily  . multivitamin with minerals  1 tablet Oral Daily  . [START ON 03/15/2017] olmesartan  40 mg Oral Q breakfast   Continuous Infusions: . vancomycin Stopped (03/14/17 1501)     LOS: 0 days    Time spent: 25 minutes  Velvet Bathe, MD [facility] Pager [phone]  If 7PM-7AM, please contact night-coverage [email or web address] Password [facility] 03/14/2017, 6:59 PM

## 2017-03-15 MED ORDER — CLINDAMYCIN HCL 300 MG PO CAPS: 300.0000 mg | ORAL_CAPSULE | Freq: Three times a day (TID) | ORAL | 0 refills | Status: AC

## 2017-03-15 NOTE — Discharge Summary (Signed)
Physician Discharge Summary  Janice [name] HER:[id number] DOB: 03-01-1941 DOA: 03/12/2017  PCP: Carol Ada, MD  Admit date: 03/12/2017 Discharge date: 03/15/2017  Time spent: > 35 minutes  Recommendations for Outpatient Follow-up:  1. LLE cellulitis will be treated for a total of 10 days. Will transition to clindamycin for 7 days on discharge.   Discharge Diagnoses:  Principal Problem:   Cellulitis of left leg Active Problems:   Hypothyroidism   Dermatomyositis ([facility])   Asthma   Essential hypertension   Left leg cellulitis   Discharge Condition: stable  Diet recommendation: Heart healthy  Filed Weights   03/12/17 2300  Weight: 111.6 kg (246 lb 0.5 oz)    History of present illness:  76 year old presenting to the hospital after failing outpatient treatment for her left lower extremity cellulitis   Hospital Course:  Principal Problem:   Cellulitis of left leg -improved on vancomycin - transition to clindamycin on discharge for 7 days  Active Problems:   Hypothyroidism - No hypothyroid symptomatology reported. Continue current Synthroid regimen. Stable    Dermatomyositis ([place])   Asthma - Stable currently no asthma exacerbation. Continue when necessary albuterol    Essential hypertension   - Continue prior to admission medication regimen.  Muscle spasm - continue prior to admission medication regimen   Procedures:  None  Consultations:  None  Discharge Exam: Vitals:   03/14/17 2030 03/15/17 0414  BP: 134/68 (!) 154/73  Pulse: 89 80  Resp: 18 18  Temp: 98.1 F (36.7 C) 97.7 F (36.5 C)  SpO2: 97% 95%    General: pt in nad, alert and awake Cardiovascular: rrr, no rubs Respiratory: no increased wob, no wheezes  Discharge Instructions   Discharge Instructions    Call MD for:  redness, tenderness, or signs of infection (pain, swelling, redness, odor or green/yellow discharge around incision site)    Complete by:  As directed    Call  MD for:  severe uncontrolled pain    Complete by:  As directed    Call MD for:  temperature >100.4    Complete by:  As directed    Diet - low sodium heart healthy    Complete by:  As directed    Discharge instructions    Complete by:  As directed    Please be sure to follow up with your primary care physician for further evaluation and recommendations.   Increase activity slowly    Complete by:  As directed      Current Discharge Medication List    START taking these medications   Details  clindamycin (CLEOCIN) 300 MG capsule Take 1 capsule (300 mg total) by mouth 3 (three) times daily. Qty: 21 capsule, Refills: 0      CONTINUE these medications which have NOT CHANGED   Details  albuterol (PROVENTIL HFA;VENTOLIN HFA) 108 (90 Base) MCG/ACT inhaler Inhale 1-2 puffs into the lungs every 6 (six) hours as needed for wheezing or shortness of breath.    Alpha-Lipoic Acid 300 MG CAPS Take 300 mg by mouth daily.    aspirin 81 MG tablet Take 81 mg by mouth daily.    baclofen (LIORESAL) 10 MG tablet Take 5 mg by mouth 2 (two) times daily.    BENICAR 40 MG tablet Take 40 mg by mouth daily with breakfast.     BIOTIN PO Take 1 tablet by mouth daily.    CALCIUM PO Take 500 mg by mouth daily.     Cholecalciferol (VITAMIN D) 2000 UNITS  CAPS Take 2,000 Units by mouth 2 (two) times daily.    fesoterodine (TOVIAZ) 8 MG TB24 tablet Take 8 mg by mouth daily.    fluticasone (FLONASE) 50 MCG/ACT nasal spray Place 1 spray into both nostrils daily as needed for allergies or rhinitis.    folic acid (FOLVITE) 1 MG tablet Take 1 mg by mouth daily.    guaiFENesin (MUCINEX) 600 MG 12 hr tablet Take 600 mg by mouth as needed (congestion).     MAGNESIUM PO Take 0.5 tablets by mouth 3 (three) times daily.     montelukast (SINGULAIR) 10 MG tablet Take 10 mg by mouth daily.     Multiple Vitamins-Minerals (MULTIVITAMIN ADULT PO) Take 1 tablet by mouth daily.     naproxen sodium (ANAPROX) 220 MG  tablet Take 220 mg by mouth 3 (three) times daily as needed (pain).     SYNTHROID 112 MCG tablet Take 112 mcg by mouth daily before breakfast.  Refills: 11       Allergies  Allergen Reactions  . Neurontin [Gabapentin] Other (See Comments)    confusion Muscle jerking  . Prednisone Anaphylaxis    psychosis  . Amlodipine Besylate Other (See Comments)    Muscle weakness  . Avapro [Irbesartan] Other (See Comments)    Muscle weakness, fatigue -- States she can only take Benicar Muscle weakness, fatigue -- States she can only take Benicar  . Ciprofloxacin Other (See Comments)    Confusion w/psychosis  . Colesevelam Other (See Comments)    Decreased energy   . Furosemide Other (See Comments)    Muscle weakness  . Lipitor [Atorvastatin] Other (See Comments)    Muscle cramps/ muscle weakness Muscle weakness  . Myrbetriq [Mirabegron] Other (See Comments)    Blurred vision   . Novocain [Procaine]     Does not work  . Penicillins Hives  . Ranitidine     Pt. Knows that it can cause: Confusion & agitation, she is not  willing to take the risk of taking this med.   . Sulfa Antibiotics Other (See Comments)    swelling  . Tizanidine     Confusion   . Tramadol Other (See Comments)    Confusion w/psychosis  . Welchol [Colesevelam Hcl] Other (See Comments)    Muscle weakness       The results of significant diagnostics from this hospitalization (including imaging, microbiology, ancillary and laboratory) are listed below for reference.    Significant Diagnostic Studies: No results found.  Microbiology: Recent Results (from the past 240 hour(s))  Culture, blood (routine x 2)     Status: Abnormal (Preliminary result)   Collection Time: 03/12/17 11:57 PM  Result Value Ref Range Status   Specimen Description BLOOD LEFT ANTECUBITAL  Final   Special Requests   Final    BOTTLES DRAWN AEROBIC AND ANAEROBIC Blood Culture adequate volume   Culture  Setup Time   Final    GRAM POSITIVE  COCCI IN CLUSTERS ANAEROBIC BOTTLE ONLY CRITICAL RESULT CALLED TO, READ BACK BY AND VERIFIED WITH: J.LEDFORD, PHARMD 03/14/17 0215 L.CHAMPION    Culture (A)  Final    STAPHYLOCOCCUS SPECIES (COAGULASE NEGATIVE) THE SIGNIFICANCE OF ISOLATING THIS ORGANISM FROM A SINGLE SET OF BLOOD CULTURES WHEN MULTIPLE SETS ARE DRAWN IS UNCERTAIN. PLEASE NOTIFY THE MICROBIOLOGY DEPARTMENT WITHIN ONE WEEK IF SPECIATION AND SENSITIVITIES ARE REQUIRED.    Report Status PENDING  Incomplete  Blood Culture ID Panel (Reflexed)     Status: Abnormal   Collection Time: 03/12/17 11:57  PM  Result Value Ref Range Status   Enterococcus species NOT DETECTED NOT DETECTED Final   Vancomycin resistance NOT DETECTED NOT DETECTED Final   Listeria monocytogenes NOT DETECTED NOT DETECTED Final   Staphylococcus species DETECTED (A) NOT DETECTED Final    Comment: CRITICAL RESULT CALLED TO, READ BACK BY AND VERIFIED WITH: J.LEDFORD, PHARMD 03/14/17 0215 L.CHAMPION    Staphylococcus aureus NOT DETECTED NOT DETECTED Final   Methicillin resistance DETECTED (A) NOT DETECTED Final    Comment: CRITICAL RESULT CALLED TO, READ BACK BY AND VERIFIED WITH: J.LEDFORD, PHARMD 03/14/17 0215 L.CHAMPION    Streptococcus species NOT DETECTED NOT DETECTED Final   Streptococcus agalactiae NOT DETECTED NOT DETECTED Final   Streptococcus pneumoniae NOT DETECTED NOT DETECTED Final   Streptococcus pyogenes NOT DETECTED NOT DETECTED Final   Acinetobacter baumannii NOT DETECTED NOT DETECTED Final   Enterobacteriaceae species NOT DETECTED NOT DETECTED Final   Enterobacter cloacae complex NOT DETECTED NOT DETECTED Final   Escherichia coli NOT DETECTED NOT DETECTED Final   Klebsiella oxytoca NOT DETECTED NOT DETECTED Final   Klebsiella pneumoniae NOT DETECTED NOT DETECTED Final   Proteus species NOT DETECTED NOT DETECTED Final   Serratia marcescens NOT DETECTED NOT DETECTED Final   Carbapenem resistance NOT DETECTED NOT DETECTED Final   Haemophilus  influenzae NOT DETECTED NOT DETECTED Final   Neisseria meningitidis NOT DETECTED NOT DETECTED Final   Pseudomonas aeruginosa NOT DETECTED NOT DETECTED Final   Candida albicans NOT DETECTED NOT DETECTED Final   Candida glabrata NOT DETECTED NOT DETECTED Final   Candida krusei NOT DETECTED NOT DETECTED Final   Candida parapsilosis NOT DETECTED NOT DETECTED Final   Candida tropicalis NOT DETECTED NOT DETECTED Final  Culture, blood (routine x 2)     Status: None (Preliminary result)   Collection Time: 03/13/17 12:11 AM  Result Value Ref Range Status   Specimen Description BLOOD LEFT WRIST  Final   Special Requests   Final    BOTTLES DRAWN AEROBIC AND ANAEROBIC Blood Culture adequate volume   Culture NO GROWTH 1 DAY  Final   Report Status PENDING  Incomplete     Labs: Basic Metabolic Panel:  Recent Labs Lab 03/12/17 1825 03/13/17 0342  NA 137 140  K 3.9 3.8  CL 104 107  CO2 24 25  GLUCOSE 152* 97  BUN 15 13  CREATININE 0.68 0.54  CALCIUM 9.5 9.6   Liver Function Tests:  Recent Labs Lab 03/12/17 1825  AST 18  ALT 16  ALKPHOS 75  BILITOT 0.4  PROT 7.0  ALBUMIN 3.4*   No results for input(s): LIPASE, AMYLASE in the last 168 hours. No results for input(s): AMMONIA in the last 168 hours. CBC:  Recent Labs Lab 03/12/17 1825 03/13/17 0342  WBC 8.7 8.4  NEUTROABS 5.9  --   HGB 12.3 12.3  HCT 37.0 37.1  MCV 86.9 87.3  PLT 322 301   Cardiac Enzymes: No results for input(s): CKTOTAL, CKMB, CKMBINDEX, TROPONINI in the last 168 hours. BNP: BNP (last 3 results) No results for input(s): BNP in the last 8760 hours.  ProBNP (last 3 results) No results for input(s): PROBNP in the last 8760 hours.  CBG: No results for input(s): GLUCAP in the last 168 hours.   Signed:  Velvet Bathe MD.  [facility] 03/15/2017, 11:28 AM

## 2017-03-16 LAB — CULTURE, BLOOD (ROUTINE X 2): SPECIAL REQUESTS: ADEQUATE

## 2017-03-18 LAB — CULTURE, BLOOD (ROUTINE X 2)
Culture: NO GROWTH
SPECIAL REQUESTS: ADEQUATE

## 2017-03-20 DIAGNOSIS — R600 Localized edema: Secondary | ICD-10-CM | POA: Diagnosis not present

## 2017-03-20 DIAGNOSIS — L03116 Cellulitis of left lower limb: Secondary | ICD-10-CM | POA: Diagnosis not present

## 2017-04-03 DIAGNOSIS — G4733 Obstructive sleep apnea (adult) (pediatric): Secondary | ICD-10-CM | POA: Diagnosis not present

## 2017-04-07 DIAGNOSIS — L03116 Cellulitis of left lower limb: Secondary | ICD-10-CM | POA: Diagnosis not present

## 2017-04-07 DIAGNOSIS — R42 Dizziness and giddiness: Secondary | ICD-10-CM | POA: Diagnosis not present

## 2017-04-15 ENCOUNTER — Other Ambulatory Visit: Payer: Medicare Other

## 2017-04-22 DIAGNOSIS — L97922 Non-pressure chronic ulcer of unspecified part of left lower leg with fat layer exposed: Secondary | ICD-10-CM | POA: Diagnosis not present

## 2017-04-22 DIAGNOSIS — R600 Localized edema: Secondary | ICD-10-CM | POA: Diagnosis not present

## 2017-04-29 ENCOUNTER — Ambulatory Visit (INDEPENDENT_AMBULATORY_CARE_PROVIDER_SITE_OTHER): Payer: Medicare Other | Admitting: Podiatry

## 2017-04-29 ENCOUNTER — Encounter: Payer: Self-pay | Admitting: Podiatry

## 2017-04-29 DIAGNOSIS — M79676 Pain in unspecified toe(s): Secondary | ICD-10-CM | POA: Diagnosis not present

## 2017-04-29 DIAGNOSIS — E039 Hypothyroidism, unspecified: Secondary | ICD-10-CM | POA: Diagnosis not present

## 2017-04-29 DIAGNOSIS — I890 Lymphedema, not elsewhere classified: Secondary | ICD-10-CM | POA: Diagnosis not present

## 2017-04-29 DIAGNOSIS — B351 Tinea unguium: Secondary | ICD-10-CM | POA: Diagnosis not present

## 2017-04-29 DIAGNOSIS — I10 Essential (primary) hypertension: Secondary | ICD-10-CM | POA: Diagnosis not present

## 2017-04-29 DIAGNOSIS — I872 Venous insufficiency (chronic) (peripheral): Secondary | ICD-10-CM | POA: Diagnosis not present

## 2017-04-29 DIAGNOSIS — I87312 Chronic venous hypertension (idiopathic) with ulcer of left lower extremity: Secondary | ICD-10-CM | POA: Diagnosis not present

## 2017-04-29 DIAGNOSIS — G4730 Sleep apnea, unspecified: Secondary | ICD-10-CM | POA: Diagnosis not present

## 2017-04-29 DIAGNOSIS — J45909 Unspecified asthma, uncomplicated: Secondary | ICD-10-CM | POA: Diagnosis not present

## 2017-04-29 DIAGNOSIS — Q828 Other specified congenital malformations of skin: Secondary | ICD-10-CM

## 2017-04-29 DIAGNOSIS — L97822 Non-pressure chronic ulcer of other part of left lower leg with fat layer exposed: Secondary | ICD-10-CM | POA: Diagnosis not present

## 2017-04-29 NOTE — Progress Notes (Signed)
This patient presents the office for continued care on her long painful nails. He also says she has developed a painful  corn on the fifth toe of the left foot. This was due in part to the brace that she uses to ambulate.  She presents the office today for evaluation and treatment of her feet   GENERAL APPEARANCE: Alert, conversant. Appropriately groomed. No acute distress.  VASCULAR: Pedal pulses are  palpable at  [facility] and PT bilateral.  Capillary refill time is immediate to all digits,  Normal temperature gradient.  Digital hair growth is present bilateral  NEUROLOGIC: sensation is normal to 5.07 monofilament at 5/5 sites bilateral.  Light touch is intact bilateral, Muscle strength normal.  MUSCULOSKELETAL: acceptable muscle strength, tone and stability bilateral.  Intrinsic muscluature intact bilateral.  Rectus appearance of foot and digits noted bilateral.   DERMATOLOGIC: skin color, texture, and turgor are within normal limits.  No preulcerative lesions or ulcers  are seen, no interdigital maceration noted.  No open lesions present.   No drainage noted. Asymptomatic  corn on dorsolateral aspect DIPJ fifth toe left foot. Porokeratosis sub 5th metabase right foot.  NAILS  Thick disfigured discolored nails hallux B/L.  Debride porokeratosis/corn   DX  Onychomycosis B/L  Debride corn fifth toe left foot.   Tx.  Debridement and grinding of nails  Debridement porokeratosis right foot.. . RTC 3 months   Gardiner Barefoot DPM

## 2017-04-30 DIAGNOSIS — M3313 Other dermatomyositis without myopathy: Secondary | ICD-10-CM | POA: Diagnosis not present

## 2017-04-30 DIAGNOSIS — Z79899 Other long term (current) drug therapy: Secondary | ICD-10-CM | POA: Diagnosis not present

## 2017-04-30 DIAGNOSIS — Z8719 Personal history of other diseases of the digestive system: Secondary | ICD-10-CM | POA: Diagnosis not present

## 2017-04-30 DIAGNOSIS — M3390 Dermatopolymyositis, unspecified, organ involvement unspecified: Secondary | ICD-10-CM | POA: Diagnosis not present

## 2017-04-30 DIAGNOSIS — K259 Gastric ulcer, unspecified as acute or chronic, without hemorrhage or perforation: Secondary | ICD-10-CM | POA: Diagnosis not present

## 2017-04-30 DIAGNOSIS — K224 Dyskinesia of esophagus: Secondary | ICD-10-CM | POA: Diagnosis not present

## 2017-04-30 DIAGNOSIS — K222 Esophageal obstruction: Secondary | ICD-10-CM | POA: Diagnosis not present

## 2017-05-01 ENCOUNTER — Telehealth (INDEPENDENT_AMBULATORY_CARE_PROVIDER_SITE_OTHER): Payer: Self-pay | Admitting: Orthopedic Surgery

## 2017-05-01 NOTE — Telephone Encounter (Signed)
Faxed to [facility] confirmation at 4:41pm.

## 2017-05-01 NOTE — Telephone Encounter (Signed)
Patient called saying that her brace broke today and she is needing a new RX sent to [facility] asap. She said she cannot walk without it. If you could give her a call whenever its been sent in. Thank you. [phone]

## 2017-05-09 DIAGNOSIS — L97822 Non-pressure chronic ulcer of other part of left lower leg with fat layer exposed: Secondary | ICD-10-CM | POA: Diagnosis not present

## 2017-05-09 DIAGNOSIS — I890 Lymphedema, not elsewhere classified: Secondary | ICD-10-CM | POA: Diagnosis not present

## 2017-05-09 DIAGNOSIS — L089 Local infection of the skin and subcutaneous tissue, unspecified: Secondary | ICD-10-CM | POA: Diagnosis not present

## 2017-05-09 DIAGNOSIS — I872 Venous insufficiency (chronic) (peripheral): Secondary | ICD-10-CM | POA: Diagnosis not present

## 2017-05-09 DIAGNOSIS — I87332 Chronic venous hypertension (idiopathic) with ulcer and inflammation of left lower extremity: Secondary | ICD-10-CM | POA: Diagnosis not present

## 2017-05-16 DIAGNOSIS — I890 Lymphedema, not elsewhere classified: Secondary | ICD-10-CM | POA: Diagnosis not present

## 2017-05-16 DIAGNOSIS — L97822 Non-pressure chronic ulcer of other part of left lower leg with fat layer exposed: Secondary | ICD-10-CM | POA: Diagnosis not present

## 2017-05-16 DIAGNOSIS — I872 Venous insufficiency (chronic) (peripheral): Secondary | ICD-10-CM | POA: Diagnosis not present

## 2017-05-16 DIAGNOSIS — I87312 Chronic venous hypertension (idiopathic) with ulcer of left lower extremity: Secondary | ICD-10-CM | POA: Diagnosis not present

## 2017-05-20 DIAGNOSIS — Z23 Encounter for immunization: Secondary | ICD-10-CM | POA: Diagnosis not present

## 2017-05-23 DIAGNOSIS — I87312 Chronic venous hypertension (idiopathic) with ulcer of left lower extremity: Secondary | ICD-10-CM | POA: Diagnosis not present

## 2017-05-23 DIAGNOSIS — L97822 Non-pressure chronic ulcer of other part of left lower leg with fat layer exposed: Secondary | ICD-10-CM | POA: Diagnosis not present

## 2017-05-23 DIAGNOSIS — I872 Venous insufficiency (chronic) (peripheral): Secondary | ICD-10-CM | POA: Diagnosis not present

## 2017-05-30 DIAGNOSIS — I87312 Chronic venous hypertension (idiopathic) with ulcer of left lower extremity: Secondary | ICD-10-CM | POA: Diagnosis not present

## 2017-05-30 DIAGNOSIS — L97822 Non-pressure chronic ulcer of other part of left lower leg with fat layer exposed: Secondary | ICD-10-CM | POA: Diagnosis not present

## 2017-06-06 DIAGNOSIS — L97822 Non-pressure chronic ulcer of other part of left lower leg with fat layer exposed: Secondary | ICD-10-CM | POA: Diagnosis not present

## 2017-06-06 DIAGNOSIS — I87312 Chronic venous hypertension (idiopathic) with ulcer of left lower extremity: Secondary | ICD-10-CM | POA: Diagnosis not present

## 2017-06-09 ENCOUNTER — Telehealth (INDEPENDENT_AMBULATORY_CARE_PROVIDER_SITE_OTHER): Payer: Self-pay | Admitting: Family

## 2017-06-09 NOTE — Telephone Encounter (Signed)
09/04/2016 OV Note faxed to [facility]. Stated patient was not seen 10/4, only the order for the AFO was written that date. Fax [phone]

## 2017-06-12 DIAGNOSIS — I87312 Chronic venous hypertension (idiopathic) with ulcer of left lower extremity: Secondary | ICD-10-CM | POA: Diagnosis not present

## 2017-06-12 DIAGNOSIS — I872 Venous insufficiency (chronic) (peripheral): Secondary | ICD-10-CM | POA: Diagnosis not present

## 2017-06-12 DIAGNOSIS — I890 Lymphedema, not elsewhere classified: Secondary | ICD-10-CM | POA: Diagnosis not present

## 2017-06-12 DIAGNOSIS — L97822 Non-pressure chronic ulcer of other part of left lower leg with fat layer exposed: Secondary | ICD-10-CM | POA: Diagnosis not present

## 2017-06-17 ENCOUNTER — Encounter (INDEPENDENT_AMBULATORY_CARE_PROVIDER_SITE_OTHER): Payer: Self-pay | Admitting: Orthopedic Surgery

## 2017-06-17 ENCOUNTER — Ambulatory Visit (INDEPENDENT_AMBULATORY_CARE_PROVIDER_SITE_OTHER): Payer: Medicare Other | Admitting: Orthopedic Surgery

## 2017-06-17 DIAGNOSIS — M21371 Foot drop, right foot: Secondary | ICD-10-CM | POA: Diagnosis not present

## 2017-06-17 NOTE — Progress Notes (Signed)
Office Visit Note   Patient: Janice [name]           Date of Birth: 04-Mar-1941           MRN: [id number] Visit Date: 06/17/2017              Requested by: Carol Ada, [place], [place] [address] PCP: Carol Ada, MD  Chief Complaint  Patient presents with  . Right Ankle - Follow-up  . 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  Patient has been going to physical therapy as well to help on her strengthening secondary to the underlying disease.  Patient has broken the double upright braces twice for the right lower extremity she states she does not know when to get a break again and is concerned of falling and injuring herself due to the instability with the brace.  Assessment & Plan: Visit Diagnoses:  1. Foot drop, right     Plan: Patient was given a prescription for a biotech for a new double upright brace new extra-depth shoes new custom orthotics and posting to provide better stability with her foot drop.  Patient was given a prescription to continue physical therapy and aspirin with [place] improved strengthening and gait training.  Follow-Up Instructions: Return if symptoms worsen or fail to improve.   Ortho Exam  Patient is alert, oriented, no adenopathy, well-dressed, normal affect, normal respiratory effort. Examination patient ambulates with a rolling walker.  She has foot drop on the right her foot is plantigrade the braces are broken.  She has no venous stasis ulcers.  Imaging: No results found. No images are attached to the encounter.  Labs: Lab Results  Component Value Date   CRP <0.8 03/13/2017   REPTSTATUS 03/18/2017 FINAL 03/13/2017   CULT NO GROWTH 5 DAYS 03/13/2017    @LABSALLVALUES (HGBA1)@  @BMI1 @  Orders:  No orders of the  defined types were placed in this encounter.  No orders of the defined types were placed in this encounter.    Procedures: No procedures performed  Clinical Data: No additional findings.  ROS:  All other systems negative, except as noted in the HPI. Review of Systems  Objective: Vital Signs: There were no vitals taken for this visit.  Specialty Comments:  No specialty comments available.  PMFS History: Patient Active Problem List   Diagnosis Date Noted  . Left leg cellulitis 03/14/2017  . Essential hypertension 03/13/2017  . Cellulitis of left leg 03/13/2017  . Hypothyroidism   . Dermatomyositis ([place])   . Asthma   . 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 Symptomatic cholelithiasis 03/25/2014   Past Medical History:  Diagnosis Date  . Anemia    rec'd her own blood in the past postop x2   . Arthritis    OSA- knees & shoulders   . Asthma   . Cellulitis and abscess of left leg 03/13/2017  . 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 Family history of anesthesia complication    SISTER WAKES UP FIGHTING "  . Fibromyalgia    perhaps, not sure    . GERD (gastroesophageal reflux disease)   . Headache    migraine history  . Heart murmur    echo 09-very  mild  . Hx of appendectomy   . Hypertension, benign   . Hypothyroidism   . Muscular weakness   . Sciatic pain   . Sleep apnea    sleep study done /w Dr. Maxwell Caul, uses CPAP q night   . Spinal stenosis   . Stenosis of right carotid artery   . Trigeminal neuralgia   . Wears glasses     Family History  Problem Relation Age of Onset  . Cancer Mother   . Heart attack Father   . Cancer Father   . Diabetes Maternal Grandmother     Past Surgical History:  Procedure Laterality Date  . APPENDECTOMY    . CHOLECYSTECTOMY  04/12/2014   DR BLACMAN  . CHOLECYSTECTOMY N/A 04/12/2014   Procedure: LAPAROSCOPIC CHOLECYSTECTOMY;  Surgeon: Coralie Keens, MD;  Location: [facility];  Service:  General;  Laterality: N/A;  . ESOPHAGOGASTRODUODENOSCOPY  2018  . EYE SURGERY     cataracts removed, /w IOL - bilateral   . LUMBAR FUSION  2004  . SHOULDER SURGERY  2000   right  . TONSILLECTOMY    . TOTAL HIP ARTHROPLASTY  2005   right  . TOTAL KNEE ARTHROPLASTY  2001   right  . TOTAL KNEE ARTHROPLASTY Left 02/01/2015  . TOTAL KNEE ARTHROPLASTY Left 02/01/2015   Procedure: LEFT TOTAL KNEE ARTHROPLASTY;  Surgeon: Newt Minion, MD;  Location: [facility];  Service: Orthopedics;  Laterality: Left;   Social History   Occupational History  . Not on file  Tobacco Use  . Smoking status: Never Smoker  . Smokeless tobacco: Never Used  Substance and Sexual Activity  . Alcohol use: No  . Drug use: No  . Sexual activity: Not on file

## 2017-06-20 DIAGNOSIS — L97822 Non-pressure chronic ulcer of other part of left lower leg with fat layer exposed: Secondary | ICD-10-CM | POA: Diagnosis not present

## 2017-06-20 DIAGNOSIS — I87312 Chronic venous hypertension (idiopathic) with ulcer of left lower extremity: Secondary | ICD-10-CM | POA: Diagnosis not present

## 2017-06-20 DIAGNOSIS — I890 Lymphedema, not elsewhere classified: Secondary | ICD-10-CM | POA: Diagnosis not present

## 2017-06-23 DIAGNOSIS — S81802D Unspecified open wound, left lower leg, subsequent encounter: Secondary | ICD-10-CM | POA: Diagnosis not present

## 2017-06-23 DIAGNOSIS — M62838 Other muscle spasm: Secondary | ICD-10-CM | POA: Diagnosis not present

## 2017-06-23 DIAGNOSIS — M3392 Dermatopolymyositis, unspecified with myopathy: Secondary | ICD-10-CM | POA: Diagnosis not present

## 2017-06-23 DIAGNOSIS — M150 Primary generalized (osteo)arthritis: Secondary | ICD-10-CM | POA: Diagnosis not present

## 2017-06-25 ENCOUNTER — Telehealth (INDEPENDENT_AMBULATORY_CARE_PROVIDER_SITE_OTHER): Payer: Self-pay | Admitting: Orthopedic Surgery

## 2017-06-25 NOTE — Telephone Encounter (Signed)
06/17/2017 OV note faxed to [employer] [phone]

## 2017-06-27 DIAGNOSIS — I890 Lymphedema, not elsewhere classified: Secondary | ICD-10-CM | POA: Diagnosis not present

## 2017-06-27 DIAGNOSIS — L97822 Non-pressure chronic ulcer of other part of left lower leg with fat layer exposed: Secondary | ICD-10-CM | POA: Diagnosis not present

## 2017-06-27 DIAGNOSIS — I87312 Chronic venous hypertension (idiopathic) with ulcer of left lower extremity: Secondary | ICD-10-CM | POA: Diagnosis not present

## 2017-07-04 DIAGNOSIS — I890 Lymphedema, not elsewhere classified: Secondary | ICD-10-CM | POA: Diagnosis not present

## 2017-07-04 DIAGNOSIS — I872 Venous insufficiency (chronic) (peripheral): Secondary | ICD-10-CM | POA: Diagnosis not present

## 2017-07-04 DIAGNOSIS — I87312 Chronic venous hypertension (idiopathic) with ulcer of left lower extremity: Secondary | ICD-10-CM | POA: Diagnosis not present

## 2017-07-04 DIAGNOSIS — L97822 Non-pressure chronic ulcer of other part of left lower leg with fat layer exposed: Secondary | ICD-10-CM | POA: Diagnosis not present

## 2017-07-11 DIAGNOSIS — I87312 Chronic venous hypertension (idiopathic) with ulcer of left lower extremity: Secondary | ICD-10-CM | POA: Diagnosis not present

## 2017-07-11 DIAGNOSIS — I890 Lymphedema, not elsewhere classified: Secondary | ICD-10-CM | POA: Diagnosis not present

## 2017-07-11 DIAGNOSIS — L97822 Non-pressure chronic ulcer of other part of left lower leg with fat layer exposed: Secondary | ICD-10-CM | POA: Diagnosis not present

## 2017-07-16 DIAGNOSIS — K279 Peptic ulcer, site unspecified, unspecified as acute or chronic, without hemorrhage or perforation: Secondary | ICD-10-CM | POA: Diagnosis not present

## 2017-07-16 DIAGNOSIS — K259 Gastric ulcer, unspecified as acute or chronic, without hemorrhage or perforation: Secondary | ICD-10-CM | POA: Diagnosis not present

## 2017-07-16 DIAGNOSIS — Z9889 Other specified postprocedural states: Secondary | ICD-10-CM | POA: Diagnosis not present

## 2017-07-16 DIAGNOSIS — K222 Esophageal obstruction: Secondary | ICD-10-CM | POA: Diagnosis not present

## 2017-07-16 DIAGNOSIS — M3390 Dermatopolymyositis, unspecified, organ involvement unspecified: Secondary | ICD-10-CM | POA: Diagnosis not present

## 2017-07-16 DIAGNOSIS — E669 Obesity, unspecified: Secondary | ICD-10-CM | POA: Diagnosis not present

## 2017-07-16 DIAGNOSIS — Z6841 Body Mass Index (BMI) 40.0 and over, adult: Secondary | ICD-10-CM | POA: Diagnosis not present

## 2017-07-18 DIAGNOSIS — I872 Venous insufficiency (chronic) (peripheral): Secondary | ICD-10-CM | POA: Diagnosis not present

## 2017-07-18 DIAGNOSIS — L97822 Non-pressure chronic ulcer of other part of left lower leg with fat layer exposed: Secondary | ICD-10-CM | POA: Diagnosis not present

## 2017-07-18 DIAGNOSIS — I87312 Chronic venous hypertension (idiopathic) with ulcer of left lower extremity: Secondary | ICD-10-CM | POA: Diagnosis not present

## 2017-07-18 DIAGNOSIS — I890 Lymphedema, not elsewhere classified: Secondary | ICD-10-CM | POA: Diagnosis not present

## 2017-07-30 ENCOUNTER — Encounter: Payer: Self-pay | Admitting: Podiatry

## 2017-07-30 ENCOUNTER — Ambulatory Visit (INDEPENDENT_AMBULATORY_CARE_PROVIDER_SITE_OTHER): Payer: Medicare Other | Admitting: Podiatry

## 2017-07-30 DIAGNOSIS — M79676 Pain in unspecified toe(s): Secondary | ICD-10-CM

## 2017-07-30 DIAGNOSIS — B351 Tinea unguium: Secondary | ICD-10-CM

## 2017-07-30 NOTE — Progress Notes (Addendum)
This patient presents the office for continued care on her long painful nails. . Patient says her calluses are not painful due to her new brace. She presents the office today for evaluation and treatment of her feet   GENERAL APPEARANCE: Alert, conversant. Appropriately groomed. No acute distress.  VASCULAR: Pedal pulses are  palpable at  [facility] and PT bilateral.  Capillary refill time is immediate to all digits,  Normal temperature gradient.  Digital hair growth is present bilateral  NEUROLOGIC: sensation is normal to 5.07 monofilament at 5/5 sites bilateral.  Light touch is intact bilateral, Muscle strength normal.  MUSCULOSKELETAL: acceptable muscle strength, tone and stability bilateral.  Intrinsic muscluature intact bilateral.  Rectus appearance of foot and digits noted bilateral.   DERMATOLOGIC: skin color, texture, and turgor are within normal limits.  No preulcerative lesions or ulcers  are seen, no interdigital maceration noted.  No open lesions present.   No drainage noted. Asymptomatic  corn on dorsolateral aspect DIPJ fifth toe left foot. Po  NAILS  Thick disfigured discolored nails hallux through fifth toenails  B/L.     DX  Onychomycosis B/L     Tx.  Debridement and grinding of nails  Debridement porokeratosis right foot.. . RTC 3 months  ABN signed for 2019.   Gardiner Barefoot DPM

## 2017-08-05 DIAGNOSIS — N3281 Overactive bladder: Secondary | ICD-10-CM | POA: Diagnosis not present

## 2017-08-05 DIAGNOSIS — E039 Hypothyroidism, unspecified: Secondary | ICD-10-CM | POA: Diagnosis not present

## 2017-08-05 DIAGNOSIS — E7800 Pure hypercholesterolemia, unspecified: Secondary | ICD-10-CM | POA: Diagnosis not present

## 2017-08-05 DIAGNOSIS — Z23 Encounter for immunization: Secondary | ICD-10-CM | POA: Diagnosis not present

## 2017-08-05 DIAGNOSIS — J4530 Mild persistent asthma, uncomplicated: Secondary | ICD-10-CM | POA: Diagnosis not present

## 2017-08-05 DIAGNOSIS — I10 Essential (primary) hypertension: Secondary | ICD-10-CM | POA: Diagnosis not present

## 2017-08-05 DIAGNOSIS — R600 Localized edema: Secondary | ICD-10-CM | POA: Diagnosis not present

## 2017-08-08 DIAGNOSIS — M3313 Other dermatomyositis without myopathy: Secondary | ICD-10-CM | POA: Diagnosis not present

## 2017-08-08 DIAGNOSIS — G4730 Sleep apnea, unspecified: Secondary | ICD-10-CM | POA: Diagnosis not present

## 2017-08-08 DIAGNOSIS — L97822 Non-pressure chronic ulcer of other part of left lower leg with fat layer exposed: Secondary | ICD-10-CM | POA: Diagnosis not present

## 2017-08-08 DIAGNOSIS — I890 Lymphedema, not elsewhere classified: Secondary | ICD-10-CM | POA: Diagnosis not present

## 2017-08-08 DIAGNOSIS — J45909 Unspecified asthma, uncomplicated: Secondary | ICD-10-CM | POA: Diagnosis not present

## 2017-08-08 DIAGNOSIS — I87312 Chronic venous hypertension (idiopathic) with ulcer of left lower extremity: Secondary | ICD-10-CM | POA: Diagnosis not present

## 2017-08-08 DIAGNOSIS — I10 Essential (primary) hypertension: Secondary | ICD-10-CM | POA: Diagnosis not present

## 2017-08-15 DIAGNOSIS — I890 Lymphedema, not elsewhere classified: Secondary | ICD-10-CM | POA: Diagnosis not present

## 2017-08-15 DIAGNOSIS — L97822 Non-pressure chronic ulcer of other part of left lower leg with fat layer exposed: Secondary | ICD-10-CM | POA: Diagnosis not present

## 2017-08-15 DIAGNOSIS — I87312 Chronic venous hypertension (idiopathic) with ulcer of left lower extremity: Secondary | ICD-10-CM | POA: Diagnosis not present

## 2017-08-15 DIAGNOSIS — M3313 Other dermatomyositis without myopathy: Secondary | ICD-10-CM | POA: Diagnosis not present

## 2017-08-22 DIAGNOSIS — L97822 Non-pressure chronic ulcer of other part of left lower leg with fat layer exposed: Secondary | ICD-10-CM | POA: Diagnosis not present

## 2017-08-22 DIAGNOSIS — I87312 Chronic venous hypertension (idiopathic) with ulcer of left lower extremity: Secondary | ICD-10-CM | POA: Diagnosis not present

## 2017-08-22 DIAGNOSIS — M3313 Other dermatomyositis without myopathy: Secondary | ICD-10-CM | POA: Diagnosis not present

## 2017-08-22 DIAGNOSIS — I890 Lymphedema, not elsewhere classified: Secondary | ICD-10-CM | POA: Diagnosis not present

## 2017-08-29 DIAGNOSIS — I890 Lymphedema, not elsewhere classified: Secondary | ICD-10-CM | POA: Diagnosis not present

## 2017-08-29 DIAGNOSIS — M3313 Other dermatomyositis without myopathy: Secondary | ICD-10-CM | POA: Diagnosis not present

## 2017-08-29 DIAGNOSIS — I87312 Chronic venous hypertension (idiopathic) with ulcer of left lower extremity: Secondary | ICD-10-CM | POA: Diagnosis not present

## 2017-08-29 DIAGNOSIS — L97822 Non-pressure chronic ulcer of other part of left lower leg with fat layer exposed: Secondary | ICD-10-CM | POA: Diagnosis not present

## 2017-09-02 DIAGNOSIS — E039 Hypothyroidism, unspecified: Secondary | ICD-10-CM | POA: Diagnosis not present

## 2017-09-02 DIAGNOSIS — E049 Nontoxic goiter, unspecified: Secondary | ICD-10-CM | POA: Diagnosis not present

## 2017-09-03 ENCOUNTER — Other Ambulatory Visit: Payer: Self-pay | Admitting: Endocrinology

## 2017-09-03 DIAGNOSIS — E049 Nontoxic goiter, unspecified: Secondary | ICD-10-CM

## 2017-09-12 DIAGNOSIS — I890 Lymphedema, not elsewhere classified: Secondary | ICD-10-CM | POA: Diagnosis not present

## 2017-09-12 DIAGNOSIS — I87312 Chronic venous hypertension (idiopathic) with ulcer of left lower extremity: Secondary | ICD-10-CM | POA: Diagnosis not present

## 2017-09-12 DIAGNOSIS — L97822 Non-pressure chronic ulcer of other part of left lower leg with fat layer exposed: Secondary | ICD-10-CM | POA: Diagnosis not present

## 2017-09-12 DIAGNOSIS — M3313 Other dermatomyositis without myopathy: Secondary | ICD-10-CM | POA: Diagnosis not present

## 2017-09-19 DIAGNOSIS — I890 Lymphedema, not elsewhere classified: Secondary | ICD-10-CM | POA: Diagnosis not present

## 2017-09-19 DIAGNOSIS — M3313 Other dermatomyositis without myopathy: Secondary | ICD-10-CM | POA: Diagnosis not present

## 2017-09-19 DIAGNOSIS — L97822 Non-pressure chronic ulcer of other part of left lower leg with fat layer exposed: Secondary | ICD-10-CM | POA: Diagnosis not present

## 2017-09-19 DIAGNOSIS — I87312 Chronic venous hypertension (idiopathic) with ulcer of left lower extremity: Secondary | ICD-10-CM | POA: Diagnosis not present

## 2017-09-22 ENCOUNTER — Other Ambulatory Visit: Payer: Medicare Other

## 2017-09-26 DIAGNOSIS — I87312 Chronic venous hypertension (idiopathic) with ulcer of left lower extremity: Secondary | ICD-10-CM | POA: Diagnosis not present

## 2017-09-26 DIAGNOSIS — M3313 Other dermatomyositis without myopathy: Secondary | ICD-10-CM | POA: Diagnosis not present

## 2017-09-26 DIAGNOSIS — L97822 Non-pressure chronic ulcer of other part of left lower leg with fat layer exposed: Secondary | ICD-10-CM | POA: Diagnosis not present

## 2017-09-26 DIAGNOSIS — I890 Lymphedema, not elsewhere classified: Secondary | ICD-10-CM | POA: Diagnosis not present

## 2017-10-02 DIAGNOSIS — I890 Lymphedema, not elsewhere classified: Secondary | ICD-10-CM | POA: Diagnosis not present

## 2017-10-02 DIAGNOSIS — L97822 Non-pressure chronic ulcer of other part of left lower leg with fat layer exposed: Secondary | ICD-10-CM | POA: Diagnosis not present

## 2017-10-02 DIAGNOSIS — I87312 Chronic venous hypertension (idiopathic) with ulcer of left lower extremity: Secondary | ICD-10-CM | POA: Diagnosis not present

## 2017-10-02 DIAGNOSIS — M3313 Other dermatomyositis without myopathy: Secondary | ICD-10-CM | POA: Diagnosis not present

## 2017-10-09 DIAGNOSIS — I87312 Chronic venous hypertension (idiopathic) with ulcer of left lower extremity: Secondary | ICD-10-CM | POA: Diagnosis not present

## 2017-10-09 DIAGNOSIS — L97829 Non-pressure chronic ulcer of other part of left lower leg with unspecified severity: Secondary | ICD-10-CM | POA: Diagnosis not present

## 2017-10-09 DIAGNOSIS — Z8739 Personal history of other diseases of the musculoskeletal system and connective tissue: Secondary | ICD-10-CM | POA: Diagnosis not present

## 2017-10-09 DIAGNOSIS — Z09 Encounter for follow-up examination after completed treatment for conditions other than malignant neoplasm: Secondary | ICD-10-CM | POA: Diagnosis not present

## 2017-10-09 DIAGNOSIS — I890 Lymphedema, not elsewhere classified: Secondary | ICD-10-CM | POA: Diagnosis not present

## 2017-10-09 DIAGNOSIS — Z8679 Personal history of other diseases of the circulatory system: Secondary | ICD-10-CM | POA: Diagnosis not present

## 2017-10-17 IMAGING — US US SOFT TISSUE HEAD/NECK
1 series · 13 of 25 positions shown · non-contrast
Comparison: 09/05/2014, 09/07/2013, 08/31/2012

CLINICAL DATA: 75-year-old female with a history of thyroid
nodules.

Prior survey 09/05/2014 demonstrated continued decreased size of
EXAM:
THYROID ULTRASOUND
TECHNIQUE: Ultrasound examination of the thyroid gland and adjacent soft
tissues was performed.

[Series 1: us soft tissue head/neck · 0.09mm/px · 13 of 47 slices shown]
[im 1/47]
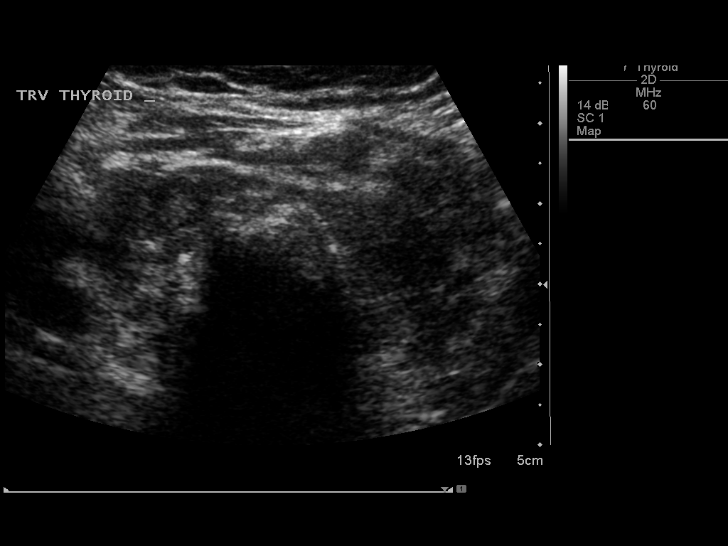
[im 4/47]
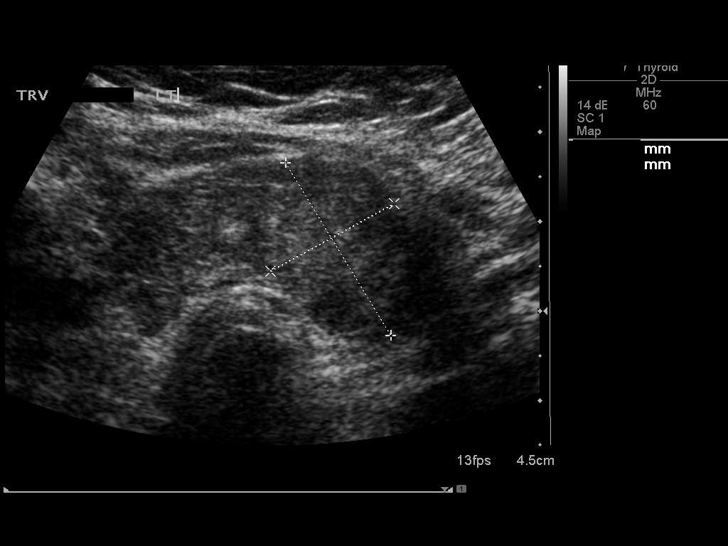
[im 8/47]
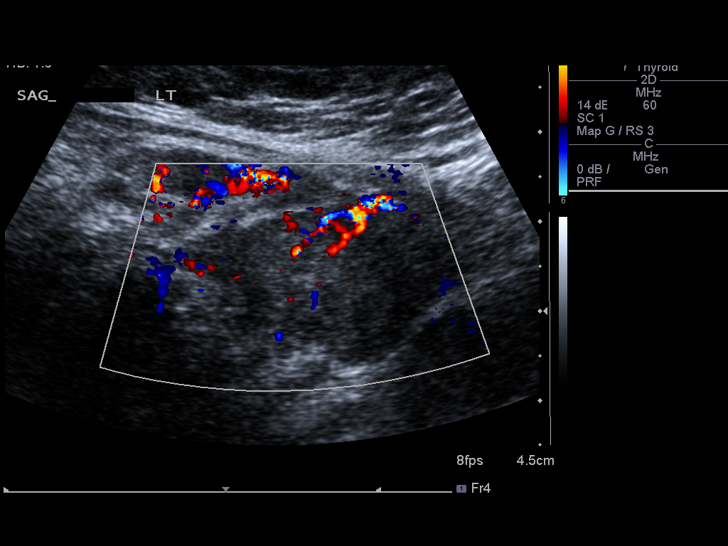
[im 12/47]
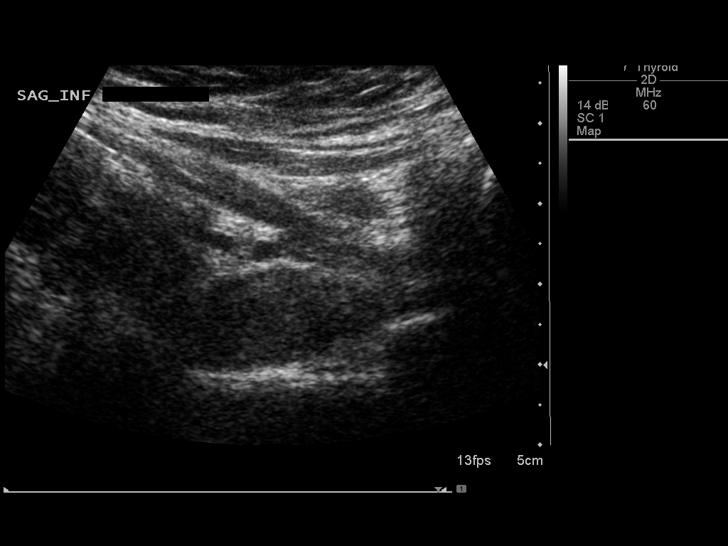
[im 16/47]
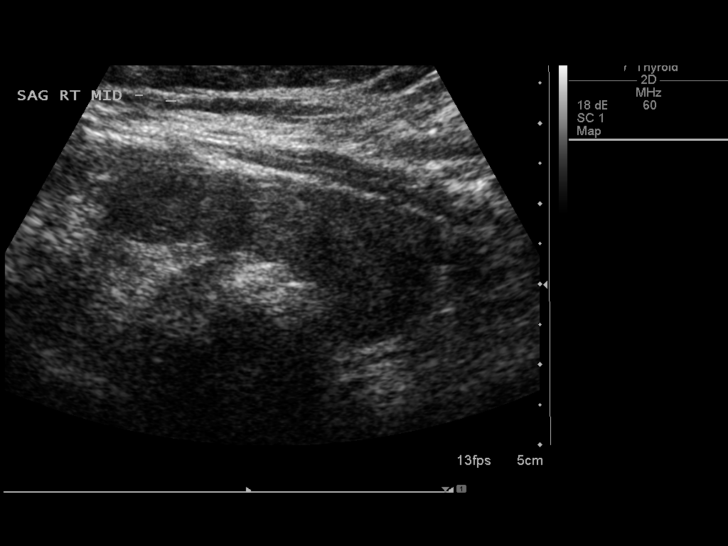
[im 20/47]
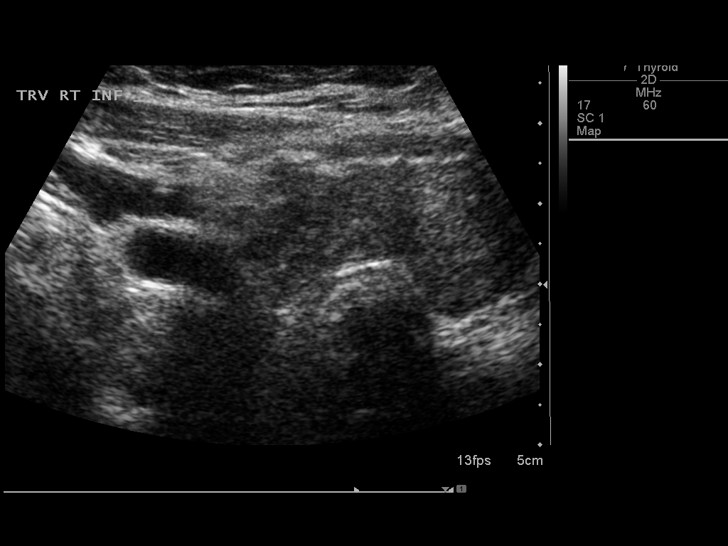
[im 24/47]
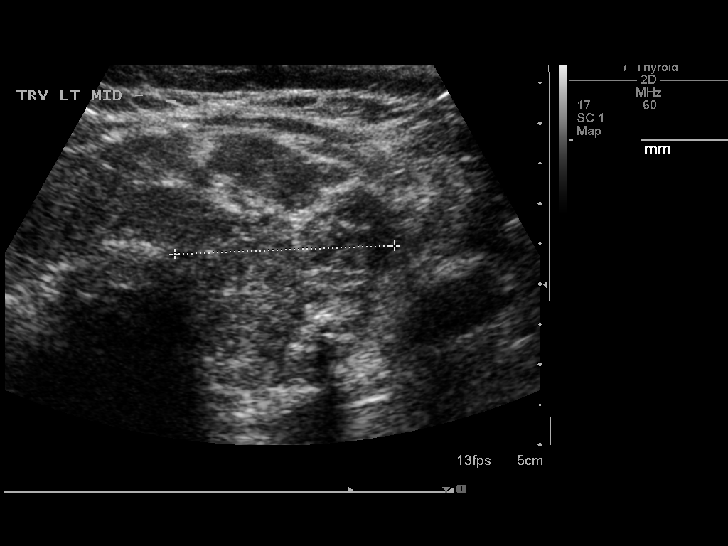
[im 27/47]
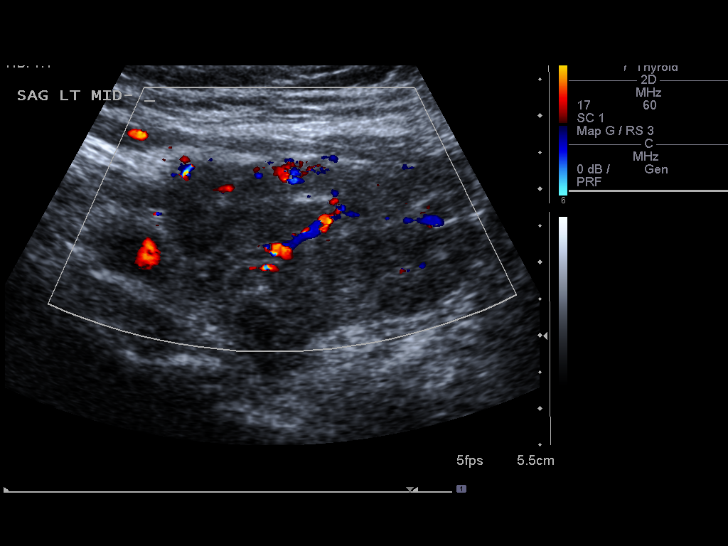
[im 31/47]
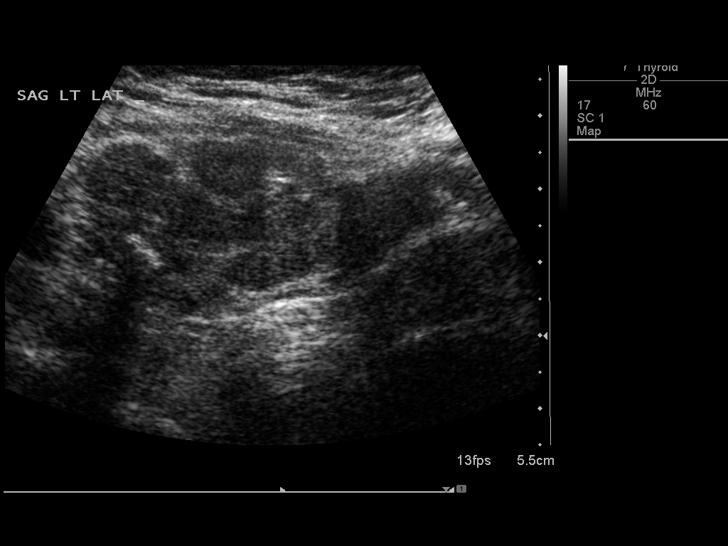
[im 35/47]
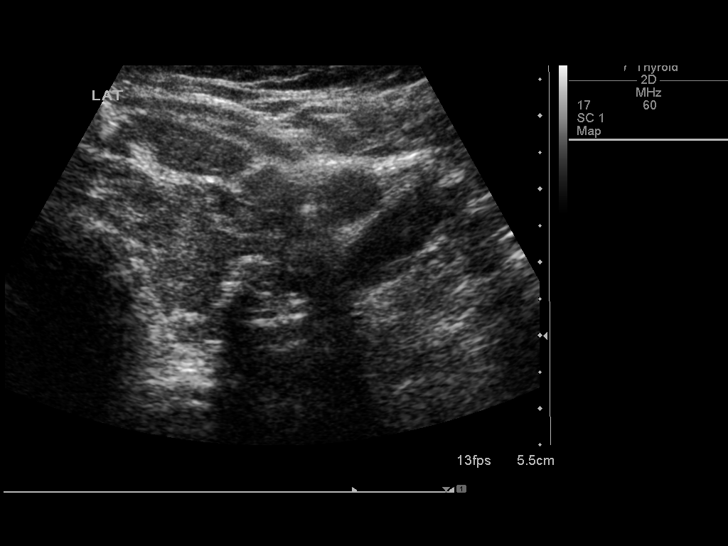
[im 39/47]
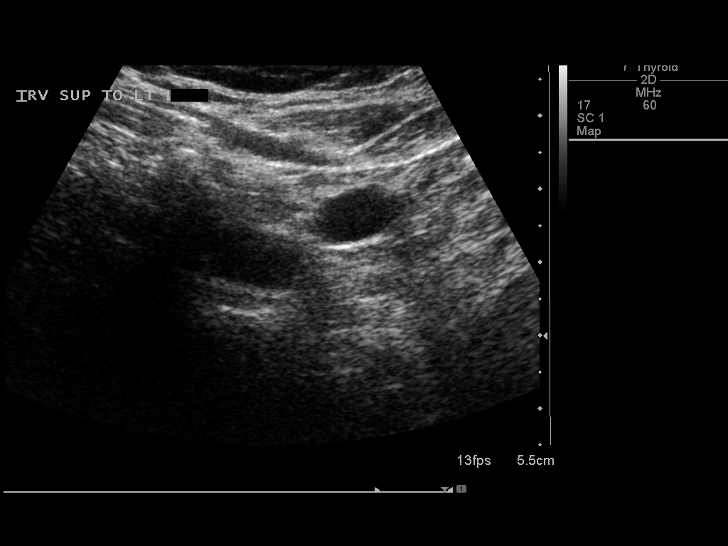
[im 43/47]
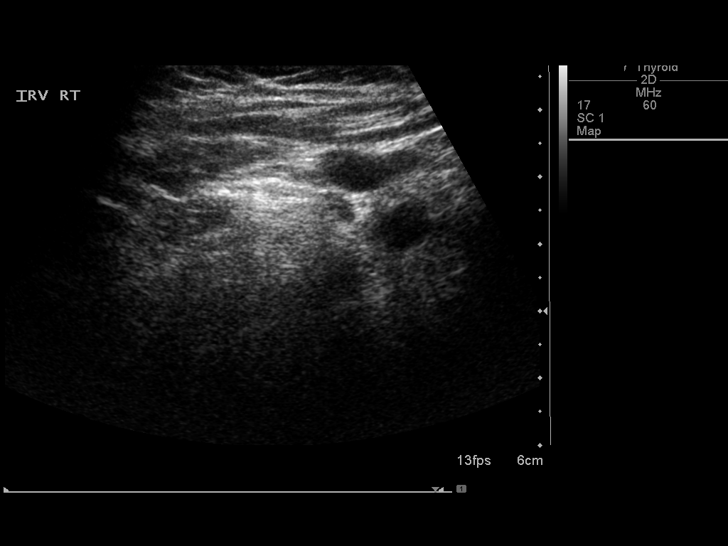
[im 47/47]
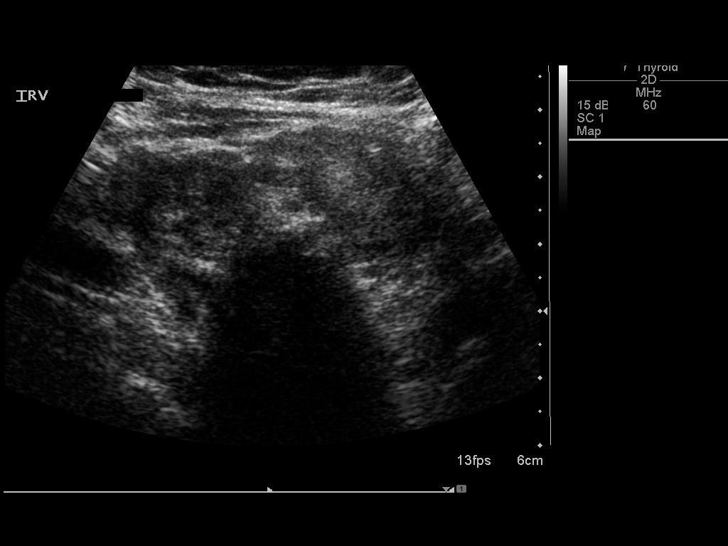

[13 of 25 positions shown; findings below may reference images not displayed]

FINDINGS: Right thyroid lobe

Measurements: 4.3 cm x 1.9 cm x 2.1 cm. Diffusely heterogeneous and
pseudo nodular appearance of the right thyroid tissue with
relatively increased internal flow.

Left thyroid lobe

Measurements: 4.7 cm x 3.1 cm x 2.7 cm. Heterogeneous and pseudo
nodular appearance of the left thyroid tissue with relatively
increased internal flow.

Measured nodule on the left is larger than the comparison study,
however, the images demonstrate a similar appearance to the
comparison study of 09/05/2014. This nodule may have increased
slightly or simply representing pseudo nodular appearance.
Measurement today is 2.3 cm x 1.6 cm x 2.1 cm.

Coarsened calcifications measure 10 mm x 10 mm x 8 mm.

Isthmus

Thickness: 1.2 cm. Nodule inferior to the isthmus is measuring
smaller, today 1.7 cm x 1.2 cm x 1.7 cm.

Lymphadenopathy

None visualized.
IMPRESSION: Heterogeneous and pseudo nodular appearance of the thyroid with
relatively increased flow, suggesting spectrum of thyroiditis.

Overall the appearance is similar to the comparison ultrasound
survey, although the dominant left nodule measures slightly larger
than the comparison. Although this nodule does meet strict
ultrasound criteria for biopsy, given the patient's age,
surveillance would also be reasonable.

## 2017-10-21 DIAGNOSIS — M150 Primary generalized (osteo)arthritis: Secondary | ICD-10-CM | POA: Diagnosis not present

## 2017-10-21 DIAGNOSIS — M3312 Other dermatopolymyositis with myopathy: Secondary | ICD-10-CM | POA: Diagnosis not present

## 2017-10-21 DIAGNOSIS — M62838 Other muscle spasm: Secondary | ICD-10-CM | POA: Diagnosis not present

## 2017-10-21 DIAGNOSIS — M3390 Dermatopolymyositis, unspecified, organ involvement unspecified: Secondary | ICD-10-CM | POA: Diagnosis not present

## 2017-10-21 DIAGNOSIS — M3392 Dermatopolymyositis, unspecified with myopathy: Secondary | ICD-10-CM | POA: Diagnosis not present

## 2017-10-21 DIAGNOSIS — S81802D Unspecified open wound, left lower leg, subsequent encounter: Secondary | ICD-10-CM | POA: Diagnosis not present

## 2017-10-24 ENCOUNTER — Ambulatory Visit
Admission: RE | Admit: 2017-10-24 | Discharge: 2017-10-24 | Disposition: A | Discharge status: Home / Self Care | Payer: Medicare Other | Source: Ambulatory Visit | Attending: Endocrinology | Admitting: Endocrinology

## 2017-10-24 DIAGNOSIS — E049 Nontoxic goiter, unspecified: Secondary | ICD-10-CM

## 2017-10-24 DIAGNOSIS — E041 Nontoxic single thyroid nodule: Secondary | ICD-10-CM | POA: Diagnosis not present

## 2017-10-29 ENCOUNTER — Ambulatory Visit: Payer: Medicare Other | Admitting: Podiatry

## 2017-11-03 DIAGNOSIS — M6281 Muscle weakness (generalized): Secondary | ICD-10-CM | POA: Diagnosis not present

## 2017-11-03 DIAGNOSIS — R2689 Other abnormalities of gait and mobility: Secondary | ICD-10-CM | POA: Diagnosis not present

## 2017-11-03 DIAGNOSIS — M3312 Other dermatopolymyositis with myopathy: Secondary | ICD-10-CM | POA: Diagnosis not present

## 2017-11-10 DIAGNOSIS — M6281 Muscle weakness (generalized): Secondary | ICD-10-CM | POA: Diagnosis not present

## 2017-11-10 DIAGNOSIS — M3312 Other dermatopolymyositis with myopathy: Secondary | ICD-10-CM | POA: Diagnosis not present

## 2017-11-10 DIAGNOSIS — R2689 Other abnormalities of gait and mobility: Secondary | ICD-10-CM | POA: Diagnosis not present

## 2017-11-13 DIAGNOSIS — R2689 Other abnormalities of gait and mobility: Secondary | ICD-10-CM | POA: Diagnosis not present

## 2017-11-13 DIAGNOSIS — M3312 Other dermatopolymyositis with myopathy: Secondary | ICD-10-CM | POA: Diagnosis not present

## 2017-11-13 DIAGNOSIS — M6281 Muscle weakness (generalized): Secondary | ICD-10-CM | POA: Diagnosis not present

## 2017-11-19 DIAGNOSIS — M3312 Other dermatopolymyositis with myopathy: Secondary | ICD-10-CM | POA: Diagnosis not present

## 2017-11-19 DIAGNOSIS — R2689 Other abnormalities of gait and mobility: Secondary | ICD-10-CM | POA: Diagnosis not present

## 2017-11-19 DIAGNOSIS — M6281 Muscle weakness (generalized): Secondary | ICD-10-CM | POA: Diagnosis not present

## 2017-11-21 ENCOUNTER — Ambulatory Visit (INDEPENDENT_AMBULATORY_CARE_PROVIDER_SITE_OTHER): Payer: Medicare Other | Admitting: Podiatry

## 2017-11-21 ENCOUNTER — Encounter: Payer: Self-pay | Admitting: Podiatry

## 2017-11-21 DIAGNOSIS — M79676 Pain in unspecified toe(s): Secondary | ICD-10-CM | POA: Diagnosis not present

## 2017-11-21 DIAGNOSIS — B351 Tinea unguium: Secondary | ICD-10-CM | POA: Diagnosis not present

## 2017-11-21 NOTE — Progress Notes (Signed)
This patient presents the office for continued care on her long painful nails. . Patient says her calluses are not painful due to her new brace. She presents the office today for evaluation and treatment of her feet.  Patient is unable to self treat.   GENERAL APPEARANCE: Alert, conversant. Appropriately groomed. No acute distress.  VASCULAR: Pedal pulses are  palpable at  DP and PT bilateral.  Capillary refill time is immediate to all digits,  Normal temperature gradient.   NEUROLOGIC: sensation is normal to 5.07 monofilament at 5/5 sites bilateral.  Light touch is intact bilateral, Muscle strength normal.  MUSCULOSKELETAL: acceptable muscle strength, tone and stability bilateral.  Intrinsic muscluature intact bilateral.  Rectus appearance of foot and digits noted bilateral.   DERMATOLOGIC: skin color, texture, and turgor are within normal limits.  No preulcerative lesions or ulcers  are seen, no interdigital maceration noted.  No open lesions present.   No drainage noted. Asymptomatic  corn on dorsolateral aspect DIPJ fifth toe left foot.   NAILS  Thick disfigured discolored nails hallux through fifth toenails  B/L.     DX  Onychomycosis B/L     Tx.  Debridement and grinding of nails  Debridement porokeratosis right foot.. . RTC 3 months  ABN signed for 2019.   Dani Wallner DPM  

## 2017-11-24 DIAGNOSIS — R2689 Other abnormalities of gait and mobility: Secondary | ICD-10-CM | POA: Diagnosis not present

## 2017-11-24 DIAGNOSIS — M6281 Muscle weakness (generalized): Secondary | ICD-10-CM | POA: Diagnosis not present

## 2017-11-24 DIAGNOSIS — M3312 Other dermatopolymyositis with myopathy: Secondary | ICD-10-CM | POA: Diagnosis not present

## 2017-11-26 DIAGNOSIS — M6281 Muscle weakness (generalized): Secondary | ICD-10-CM | POA: Diagnosis not present

## 2017-11-26 DIAGNOSIS — R2689 Other abnormalities of gait and mobility: Secondary | ICD-10-CM | POA: Diagnosis not present

## 2017-11-26 DIAGNOSIS — M3312 Other dermatopolymyositis with myopathy: Secondary | ICD-10-CM | POA: Diagnosis not present

## 2017-12-03 DIAGNOSIS — R2689 Other abnormalities of gait and mobility: Secondary | ICD-10-CM | POA: Diagnosis not present

## 2017-12-03 DIAGNOSIS — M3312 Other dermatopolymyositis with myopathy: Secondary | ICD-10-CM | POA: Diagnosis not present

## 2017-12-03 DIAGNOSIS — M6281 Muscle weakness (generalized): Secondary | ICD-10-CM | POA: Diagnosis not present

## 2017-12-08 DIAGNOSIS — R2689 Other abnormalities of gait and mobility: Secondary | ICD-10-CM | POA: Diagnosis not present

## 2017-12-08 DIAGNOSIS — M6281 Muscle weakness (generalized): Secondary | ICD-10-CM | POA: Diagnosis not present

## 2017-12-08 DIAGNOSIS — M3312 Other dermatopolymyositis with myopathy: Secondary | ICD-10-CM | POA: Diagnosis not present

## 2017-12-11 DIAGNOSIS — R2689 Other abnormalities of gait and mobility: Secondary | ICD-10-CM | POA: Diagnosis not present

## 2017-12-11 DIAGNOSIS — M3312 Other dermatopolymyositis with myopathy: Secondary | ICD-10-CM | POA: Diagnosis not present

## 2017-12-11 DIAGNOSIS — M6281 Muscle weakness (generalized): Secondary | ICD-10-CM | POA: Diagnosis not present

## 2017-12-18 DIAGNOSIS — R2689 Other abnormalities of gait and mobility: Secondary | ICD-10-CM | POA: Diagnosis not present

## 2017-12-18 DIAGNOSIS — M3312 Other dermatopolymyositis with myopathy: Secondary | ICD-10-CM | POA: Diagnosis not present

## 2017-12-18 DIAGNOSIS — M6281 Muscle weakness (generalized): Secondary | ICD-10-CM | POA: Diagnosis not present

## 2017-12-24 DIAGNOSIS — M6281 Muscle weakness (generalized): Secondary | ICD-10-CM | POA: Diagnosis not present

## 2017-12-24 DIAGNOSIS — R2689 Other abnormalities of gait and mobility: Secondary | ICD-10-CM | POA: Diagnosis not present

## 2017-12-24 DIAGNOSIS — M3312 Other dermatopolymyositis with myopathy: Secondary | ICD-10-CM | POA: Diagnosis not present

## 2017-12-29 DIAGNOSIS — R2689 Other abnormalities of gait and mobility: Secondary | ICD-10-CM | POA: Diagnosis not present

## 2017-12-29 DIAGNOSIS — M3312 Other dermatopolymyositis with myopathy: Secondary | ICD-10-CM | POA: Diagnosis not present

## 2017-12-29 DIAGNOSIS — M6281 Muscle weakness (generalized): Secondary | ICD-10-CM | POA: Diagnosis not present

## 2018-01-07 DIAGNOSIS — M6281 Muscle weakness (generalized): Secondary | ICD-10-CM | POA: Diagnosis not present

## 2018-01-07 DIAGNOSIS — M3312 Other dermatopolymyositis with myopathy: Secondary | ICD-10-CM | POA: Diagnosis not present

## 2018-01-07 DIAGNOSIS — R2689 Other abnormalities of gait and mobility: Secondary | ICD-10-CM | POA: Diagnosis not present

## 2018-01-26 DIAGNOSIS — M6281 Muscle weakness (generalized): Secondary | ICD-10-CM | POA: Diagnosis not present

## 2018-01-26 DIAGNOSIS — R2689 Other abnormalities of gait and mobility: Secondary | ICD-10-CM | POA: Diagnosis not present

## 2018-01-26 DIAGNOSIS — M3312 Other dermatopolymyositis with myopathy: Secondary | ICD-10-CM | POA: Diagnosis not present

## 2018-01-27 ENCOUNTER — Encounter: Payer: Self-pay | Admitting: Podiatry

## 2018-01-27 ENCOUNTER — Ambulatory Visit (INDEPENDENT_AMBULATORY_CARE_PROVIDER_SITE_OTHER): Payer: Medicare Other | Admitting: Podiatry

## 2018-01-27 DIAGNOSIS — B351 Tinea unguium: Secondary | ICD-10-CM | POA: Diagnosis not present

## 2018-01-27 DIAGNOSIS — M79676 Pain in unspecified toe(s): Secondary | ICD-10-CM

## 2018-01-27 NOTE — Progress Notes (Signed)
This patient presents the office for continued care on her long painful nails. . Patient says her calluses are not painful due to her new brace. She presents the office today for evaluation and treatment of her feet.  Patient is unable to self treat.   GENERAL APPEARANCE: Alert, conversant. Appropriately groomed. No acute distress.  VASCULAR: Pedal pulses are  palpable at  [facility] and PT bilateral.  Capillary refill time is immediate to all digits,  Normal temperature gradient.   NEUROLOGIC: sensation is normal to 5.07 monofilament at 5/5 sites bilateral.  Light touch is intact bilateral, Muscle strength normal.  MUSCULOSKELETAL: acceptable muscle strength, tone and stability bilateral.  Intrinsic muscluature intact bilateral.  Rectus appearance of foot and digits noted bilateral.   DERMATOLOGIC: skin color, texture, and turgor are within normal limits.  No preulcerative lesions or ulcers  are seen, no interdigital maceration noted.  No open lesions present.   No drainage noted. Asymptomatic  corn on dorsolateral aspect DIPJ fifth toe left foot.   NAILS  Thick disfigured discolored nails hallux through fifth toenails  B/L.     DX  Onychomycosis B/L     Tx.  Debridement and grinding of nails  Debridement porokeratosis right foot.. . RTC 3 months  ABN signed for 2019.   Gardiner Barefoot DPM

## 2018-02-02 DIAGNOSIS — M3392 Dermatopolymyositis, unspecified with myopathy: Secondary | ICD-10-CM | POA: Diagnosis not present

## 2018-02-02 DIAGNOSIS — E559 Vitamin D deficiency, unspecified: Secondary | ICD-10-CM | POA: Diagnosis not present

## 2018-02-02 DIAGNOSIS — G4733 Obstructive sleep apnea (adult) (pediatric): Secondary | ICD-10-CM | POA: Diagnosis not present

## 2018-02-02 DIAGNOSIS — J4530 Mild persistent asthma, uncomplicated: Secondary | ICD-10-CM | POA: Diagnosis not present

## 2018-02-02 DIAGNOSIS — N3281 Overactive bladder: Secondary | ICD-10-CM | POA: Diagnosis not present

## 2018-02-02 DIAGNOSIS — E039 Hypothyroidism, unspecified: Secondary | ICD-10-CM | POA: Diagnosis not present

## 2018-02-02 DIAGNOSIS — I10 Essential (primary) hypertension: Secondary | ICD-10-CM | POA: Diagnosis not present

## 2018-02-02 DIAGNOSIS — E7800 Pure hypercholesterolemia, unspecified: Secondary | ICD-10-CM | POA: Diagnosis not present

## 2018-02-04 DIAGNOSIS — M3312 Other dermatopolymyositis with myopathy: Secondary | ICD-10-CM | POA: Diagnosis not present

## 2018-02-04 DIAGNOSIS — R2689 Other abnormalities of gait and mobility: Secondary | ICD-10-CM | POA: Diagnosis not present

## 2018-02-04 DIAGNOSIS — M6281 Muscle weakness (generalized): Secondary | ICD-10-CM | POA: Diagnosis not present

## 2018-02-11 DIAGNOSIS — M3390 Dermatopolymyositis, unspecified, organ involvement unspecified: Secondary | ICD-10-CM | POA: Diagnosis not present

## 2018-02-11 DIAGNOSIS — M3310 Other dermatopolymyositis, organ involvement unspecified: Secondary | ICD-10-CM | POA: Diagnosis not present

## 2018-02-11 DIAGNOSIS — R933 Abnormal findings on diagnostic imaging of other parts of digestive tract: Secondary | ICD-10-CM | POA: Diagnosis not present

## 2018-02-11 DIAGNOSIS — Z6841 Body Mass Index (BMI) 40.0 and over, adult: Secondary | ICD-10-CM | POA: Diagnosis not present

## 2018-02-11 DIAGNOSIS — K222 Esophageal obstruction: Secondary | ICD-10-CM | POA: Diagnosis not present

## 2018-02-11 DIAGNOSIS — K269 Duodenal ulcer, unspecified as acute or chronic, without hemorrhage or perforation: Secondary | ICD-10-CM | POA: Diagnosis not present

## 2018-02-11 DIAGNOSIS — R531 Weakness: Secondary | ICD-10-CM | POA: Diagnosis not present

## 2018-02-18 DIAGNOSIS — S81802D Unspecified open wound, left lower leg, subsequent encounter: Secondary | ICD-10-CM | POA: Diagnosis not present

## 2018-02-18 DIAGNOSIS — M3390 Dermatopolymyositis, unspecified, organ involvement unspecified: Secondary | ICD-10-CM | POA: Diagnosis not present

## 2018-02-18 DIAGNOSIS — M150 Primary generalized (osteo)arthritis: Secondary | ICD-10-CM | POA: Diagnosis not present

## 2018-02-18 DIAGNOSIS — M62838 Other muscle spasm: Secondary | ICD-10-CM | POA: Diagnosis not present

## 2018-03-03 DIAGNOSIS — Z8679 Personal history of other diseases of the circulatory system: Secondary | ICD-10-CM | POA: Diagnosis not present

## 2018-03-03 DIAGNOSIS — Z09 Encounter for follow-up examination after completed treatment for conditions other than malignant neoplasm: Secondary | ICD-10-CM | POA: Diagnosis not present

## 2018-03-03 DIAGNOSIS — I890 Lymphedema, not elsewhere classified: Secondary | ICD-10-CM | POA: Diagnosis not present

## 2018-03-17 DIAGNOSIS — R918 Other nonspecific abnormal finding of lung field: Secondary | ICD-10-CM | POA: Diagnosis not present

## 2018-03-17 DIAGNOSIS — K3189 Other diseases of stomach and duodenum: Secondary | ICD-10-CM | POA: Diagnosis not present

## 2018-04-07 DIAGNOSIS — G4733 Obstructive sleep apnea (adult) (pediatric): Secondary | ICD-10-CM | POA: Diagnosis not present

## 2018-04-17 ENCOUNTER — Ambulatory Visit (INDEPENDENT_AMBULATORY_CARE_PROVIDER_SITE_OTHER): Payer: Medicare Other | Admitting: Podiatry

## 2018-04-17 ENCOUNTER — Encounter: Payer: Self-pay | Admitting: Podiatry

## 2018-04-17 DIAGNOSIS — M79676 Pain in unspecified toe(s): Secondary | ICD-10-CM

## 2018-04-17 DIAGNOSIS — B351 Tinea unguium: Secondary | ICD-10-CM | POA: Diagnosis not present

## 2018-04-17 NOTE — Progress Notes (Signed)
This patient presents the office for continued care on her long painful nails.  Patient says the nails are painful walking and wearing he shoes.  She is unable to self treat.  She presents the office today for evaluation and treatment of her feet   GENERAL APPEARANCE: Alert, conversant. Appropriately groomed. No acute distress.  VASCULAR: Pedal pulses are  palpable at  DP and PT bilateral.  Capillary refill time is immediate to all digits,  Normal temperature gradient.  Digital hair growth is present bilateral  NEUROLOGIC: sensation is normal to 5.07 monofilament at 5/5 sites bilateral.  Light touch is intact bilateral, Muscle strength normal.  MUSCULOSKELETAL: acceptable muscle strength, tone and stability bilateral.  Intrinsic muscluature intact bilateral.  Rectus appearance of foot and digits noted bilateral.   DERMATOLOGIC: skin color, texture, and turgor are within normal limits.  No preulcerative lesions or ulcers  are seen, no interdigital maceration noted.  No open lesions present.   No drainage noted.   NAILS  Thick disfigured discolored nails hallux B/L.     DX  Onychomycosis B/L     Tx.  Debridement and grinding of nails  . . RTC 3 months   Aidan Moten DPM  

## 2018-04-24 ENCOUNTER — Ambulatory Visit: Payer: Medicare Other | Admitting: Podiatry

## 2018-05-18 ENCOUNTER — Ambulatory Visit (INDEPENDENT_AMBULATORY_CARE_PROVIDER_SITE_OTHER): Payer: Medicare Other | Admitting: Orthopedic Surgery

## 2018-05-18 ENCOUNTER — Ambulatory Visit (INDEPENDENT_AMBULATORY_CARE_PROVIDER_SITE_OTHER): Payer: Medicare Other

## 2018-05-18 ENCOUNTER — Encounter (INDEPENDENT_AMBULATORY_CARE_PROVIDER_SITE_OTHER): Payer: Self-pay | Admitting: Orthopedic Surgery

## 2018-05-18 VITALS — Ht 61.0 in | Wt 246.0 lb

## 2018-05-18 DIAGNOSIS — M7542 Impingement syndrome of left shoulder: Secondary | ICD-10-CM | POA: Diagnosis not present

## 2018-05-18 DIAGNOSIS — M25512 Pain in left shoulder: Secondary | ICD-10-CM | POA: Diagnosis not present

## 2018-05-20 ENCOUNTER — Encounter (INDEPENDENT_AMBULATORY_CARE_PROVIDER_SITE_OTHER): Payer: Self-pay | Admitting: Orthopedic Surgery

## 2018-05-20 DIAGNOSIS — Z23 Encounter for immunization: Secondary | ICD-10-CM | POA: Diagnosis not present

## 2018-05-20 DIAGNOSIS — M7542 Impingement syndrome of left shoulder: Secondary | ICD-10-CM

## 2018-05-20 DIAGNOSIS — M25512 Pain in left shoulder: Secondary | ICD-10-CM

## 2018-05-20 MED ORDER — LIDOCAINE HCL 1 % IJ SOLN
5.0000 mL | INTRAMUSCULAR | Status: AC | PRN
  Administered 2018-05-20: 5 mL

## 2018-05-20 MED ORDER — METHYLPREDNISOLONE ACETATE 40 MG/ML IJ SUSP
40.0000 mg | INTRAMUSCULAR | Status: AC | PRN
  Administered 2018-05-20: 40 mg via INTRA_ARTICULAR

## 2018-05-20 NOTE — Progress Notes (Signed)
Office Visit Note   Patient: Janice [name]           Date of Birth: Apr 13, 1941           MRN: [id number] Visit Date: 05/18/2018              Requested by: Carol Ada, [place], [place] [address] PCP: Carol Ada, MD  Chief Complaint  Patient presents with  . Left Shoulder - Pain      HPI: Patient is a 77 year old woman with advanced arthritis of both shoulders.  Patient states that her left shoulder is gotten to the point she cannot get her hand up to the side of her head.  She states she cannot sleep she cannot perform activities of daily living with her left arm.  She states she does take Aleve for her arthritis.  Assessment & Plan: Visit Diagnoses:  1. Left shoulder pain, unspecified chronicity   2. Impingement syndrome of left shoulder     Plan: Patient underwent a subacromial injection.  Discussed that her only surgical option would be to proceed with a reverse total shoulder arthroplasty.  Recommended following up with Dr. Marlou Sa for evaluation and treatment for reverse total shoulder.  Follow-Up Instructions: Return if symptoms worsen or fail to improve.   Ortho Exam  Patient is alert, oriented, no adenopathy, well-dressed, normal affect, normal respiratory effort. Examination patient has abduction and flexion of the left shoulder 45 degrees internal and external rotation of about 10 degrees.  She cannot get her hand to the side of her head.  Patient has difficulty getting from a sit to stand position she uses a walker and a wheelchair.  Imaging: No results found. No images are attached to the encounter.  Labs: Lab Results  Component Value Date   CRP <0.8 03/13/2017   REPTSTATUS 03/18/2017 FINAL 03/13/2017   CULT NO GROWTH 5 DAYS 03/13/2017     Lab Results  Component Value Date   ALBUMIN 3.4 (L) 03/12/2017   ALBUMIN 3.8 01/09/2015    Body mass index is 46.49 kg/m.  Orders:  Orders Placed This Encounter  Procedures   . XR Shoulder Left   No orders of the defined types were placed in this encounter.    Procedures: Large Joint Inj: L subacromial bursa on 05/20/2018 3:15 PM Indications: diagnostic evaluation and pain Details: 22 G 1.5 in needle, posterior approach  Arthrogram: No  Medications: 5 mL lidocaine 1 %; 40 mg methylPREDNISolone acetate 40 MG/ML Outcome: tolerated well, no immediate complications Procedure, treatment alternatives, risks and benefits explained, specific risks discussed. Consent was given by the patient. Immediately prior to procedure a time out was called to verify the correct patient, procedure, equipment, support staff and site/side marked as required. Patient was prepped and draped in the usual sterile fashion.      Clinical Data: No additional findings.  ROS:  All other systems negative, except as noted in the HPI. Review of Systems  Objective: Vital Signs: Ht 5\' 1"  (1.549 m)   Wt 246 lb 0.5 oz (111.6 kg)   BMI 46.49 kg/m   Specialty Comments:  No specialty comments available.  PMFS History: Patient Active Problem List   Diagnosis Date Noted  . Left leg cellulitis 03/14/2017  . Essential hypertension 03/13/2017  . Cellulitis of left leg 03/13/2017  . Hypothyroidism   . Dermatomyositis ([place])   . Asthma   . 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 Symptomatic cholelithiasis 03/25/2014  Past Medical History:  Diagnosis Date  . Anemia    rec'd her own blood in the past postop x2   . Arthritis    OSA- knees & shoulders   . Asthma   . Cellulitis and abscess of left leg 03/13/2017  . 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 Family history of anesthesia complication    SISTER WAKES UP FIGHTING "  . Fibromyalgia    perhaps, not sure    . GERD (gastroesophageal reflux disease)   . Headache    migraine history  . Heart murmur    echo 09-very mild  . Hx of appendectomy   . Hypertension, benign   . Hypothyroidism    . Muscular weakness   . Sciatic pain   . Sleep apnea    sleep study done /w Dr. Maxwell Caul, uses CPAP q night   . Spinal stenosis   . Stenosis of right carotid artery   . Trigeminal neuralgia   . Wears glasses     Family History  Problem Relation Age of Onset  . Cancer Mother   . Heart attack Father   . Cancer Father   . Diabetes Maternal Grandmother     Past Surgical History:  Procedure Laterality Date  . APPENDECTOMY    . CHOLECYSTECTOMY  04/12/2014   DR BLACMAN  . CHOLECYSTECTOMY N/A 04/12/2014   Procedure: LAPAROSCOPIC CHOLECYSTECTOMY;  Surgeon: Coralie Keens, MD;  Location: [facility];  Service: General;  Laterality: N/A;  . ESOPHAGOGASTRODUODENOSCOPY  2018  . EYE SURGERY     cataracts removed, /w IOL - bilateral   . LUMBAR FUSION  2004  . SHOULDER SURGERY  2000   right  . TONSILLECTOMY    . TOTAL HIP ARTHROPLASTY  2005   right  . TOTAL KNEE ARTHROPLASTY  2001   right  . TOTAL KNEE ARTHROPLASTY Left 02/01/2015  . TOTAL KNEE ARTHROPLASTY Left 02/01/2015   Procedure: LEFT TOTAL KNEE ARTHROPLASTY;  Surgeon: Newt Minion, MD;  Location: [facility];  Service: Orthopedics;  Laterality: Left;   Social History   Occupational History  . Not on file  Tobacco Use  . Smoking status: Never Smoker  . Smokeless tobacco: Never Used  Substance and Sexual Activity  . Alcohol use: No  . Drug use: No  . Sexual activity: Not on file

## 2018-05-21 DIAGNOSIS — B9562 Methicillin resistant Staphylococcus aureus infection as the cause of diseases classified elsewhere: Secondary | ICD-10-CM | POA: Diagnosis not present

## 2018-05-21 DIAGNOSIS — L02212 Cutaneous abscess of back [any part, except buttock]: Secondary | ICD-10-CM | POA: Diagnosis not present

## 2018-05-21 DIAGNOSIS — L0390 Cellulitis, unspecified: Secondary | ICD-10-CM | POA: Diagnosis not present

## 2018-05-27 ENCOUNTER — Ambulatory Visit (INDEPENDENT_AMBULATORY_CARE_PROVIDER_SITE_OTHER): Payer: Medicare Other | Admitting: Orthopedic Surgery

## 2018-06-03 ENCOUNTER — Encounter (INDEPENDENT_AMBULATORY_CARE_PROVIDER_SITE_OTHER): Payer: Self-pay | Admitting: Orthopedic Surgery

## 2018-06-03 ENCOUNTER — Ambulatory Visit (INDEPENDENT_AMBULATORY_CARE_PROVIDER_SITE_OTHER): Payer: Medicare Other | Admitting: Orthopedic Surgery

## 2018-06-03 DIAGNOSIS — M19012 Primary osteoarthritis, left shoulder: Secondary | ICD-10-CM

## 2018-06-03 DIAGNOSIS — M25512 Pain in left shoulder: Secondary | ICD-10-CM | POA: Diagnosis not present

## 2018-06-03 NOTE — Progress Notes (Signed)
Office Visit Note   Patient: Janice [name]           Date of Birth: 23-Jul-1941           MRN: [id number] Visit Date: 06/03/2018 Requested by: Carol Ada, [place], [place] [address] PCP: Carol Ada, MD  Subjective: Chief Complaint  Patient presents with  . Left Shoulder - Pain    HPI: Patient presents with left shoulder pain.  She reports left greater than right shoulder pain.'s been increasing over the last 2 months.  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  She takes Aleve for pain but does have a history of ulcer.  She had an injection 2 weeks ago which helped.  This is interfering with her sleep.  Heating pad does help.  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  She does have a partial weightbearing shoulder in terms of using her walker.  She does live alone.              ROS: All systems reviewed are negative as they relate to the chief complaint within the history of present illness.  Patient denies  fevers or chills.   Assessment & Plan: Visit Diagnoses:  1. Left shoulder pain, unspecified chronicity   2. Arthritis of left shoulder region     Plan: Impression is left shoulder arthritis the patient with other medical problems who does live alone.  She is having fairly debilitating daily pain with that left shoulder.  Plan is reverse shoulder replacement after thin cut CT to obtain optimal implant positioning.  Risk and benefits are discussed with the patient including expected rehab.  Anticipate she would have to go to skilled nursing for period of 1 to 2 weeks to get functional with that left shoulder.  The risk and benefits including but not limited to infection nerve vessel damage dislocation and incomplete pain relief and restoration of function are all discussed.  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  She would need to have vancomycin preoperatively.  All questions  answered.  I will see her back after the CT scan.  Follow-Up Instructions: Return for after MRI.   Orders:  Orders Placed This Encounter  Procedures  . CT SHOULDER LEFT WO CONTRAST   No orders of the defined types were placed in this encounter.     Procedures: No procedures performed   Clinical Data: No additional findings.  Objective: Vital Signs: There were no vitals taken for this visit.  Physical Exam:   Constitutional: Patient appears well-developed HEENT:  Head: Normocephalic Eyes:EOM are normal Neck: Normal range of motion Cardiovascular: Normal rate Pulmonary/chest: Effort normal Neurologic: Patient is alert Skin: Skin is warm Psychiatric: Patient has normal mood and affect    Ortho Exam: Orthopedic exam demonstrates diminished rotator cuff strength on the left as well as diminished functional forward flexion and abduction both well below 90 degrees.  Deltoid is functional and fires.  Grip strength is intact and radial pulses intact on that left hand.  She does have pain with passive range of motion.  She has about 5 to 10 degrees of external rotation at 15 degrees of abduction.  Specialty Comments:  No specialty comments available.  Imaging: No results found.   PMFS History: Patient Active Problem List   Diagnosis Date Noted  . Left leg cellulitis 03/14/2017  . Essential hypertension  03/13/2017  . Cellulitis of left leg 03/13/2017  . Hypothyroidism   . Dermatomyositis ([place])   . Asthma   . 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 Symptomatic cholelithiasis 03/25/2014   Past Medical History:  Diagnosis Date  . Anemia    rec'd her own blood in the past postop x2   . Arthritis    OSA- knees & shoulders   . Asthma   . Cellulitis and abscess of left leg 03/13/2017  . 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 Family history of anesthesia complication    SISTER WAKES UP FIGHTING "  . Fibromyalgia    perhaps, not  sure    . GERD (gastroesophageal reflux disease)   . Headache    migraine history  . Heart murmur    echo 09-very mild  . Hx of appendectomy   . Hypertension, benign   . Hypothyroidism   . Muscular weakness   . Sciatic pain   . Sleep apnea    sleep study done /w Dr. Maxwell Caul, uses CPAP q night   . Spinal stenosis   . Stenosis of right carotid artery   . Trigeminal neuralgia   . Wears glasses     Family History  Problem Relation Age of Onset  . Cancer Mother   . Heart attack Father   . Cancer Father   . Diabetes Maternal Grandmother     Past Surgical History:  Procedure Laterality Date  . APPENDECTOMY    . CHOLECYSTECTOMY  04/12/2014   DR BLACMAN  . CHOLECYSTECTOMY N/A 04/12/2014   Procedure: LAPAROSCOPIC CHOLECYSTECTOMY;  Surgeon: Coralie Keens, MD;  Location: [facility];  Service: General;  Laterality: N/A;  . ESOPHAGOGASTRODUODENOSCOPY  2018  . EYE SURGERY     cataracts removed, /w IOL - bilateral   . LUMBAR FUSION  2004  . SHOULDER SURGERY  2000   right  . TONSILLECTOMY    . TOTAL HIP ARTHROPLASTY  2005   right  . TOTAL KNEE ARTHROPLASTY  2001   right  . TOTAL KNEE ARTHROPLASTY Left 02/01/2015  . TOTAL KNEE ARTHROPLASTY Left 02/01/2015   Procedure: LEFT TOTAL KNEE ARTHROPLASTY;  Surgeon: Newt Minion, MD;  Location: [facility];  Service: Orthopedics;  Laterality: Left;   Social History   Occupational History  . Not on file  Tobacco Use  . Smoking status: Never Smoker  . Smokeless tobacco: Never Used  Substance and Sexual Activity  . Alcohol use: No  . Drug use: No  . Sexual activity: Not on file

## 2018-06-19 DIAGNOSIS — M6281 Muscle weakness (generalized): Secondary | ICD-10-CM | POA: Diagnosis not present

## 2018-06-19 DIAGNOSIS — R2689 Other abnormalities of gait and mobility: Secondary | ICD-10-CM | POA: Diagnosis not present

## 2018-06-23 DIAGNOSIS — E559 Vitamin D deficiency, unspecified: Secondary | ICD-10-CM | POA: Diagnosis not present

## 2018-06-23 DIAGNOSIS — M3392 Dermatopolymyositis, unspecified with myopathy: Secondary | ICD-10-CM | POA: Diagnosis not present

## 2018-06-23 DIAGNOSIS — E7800 Pure hypercholesterolemia, unspecified: Secondary | ICD-10-CM | POA: Diagnosis not present

## 2018-06-23 DIAGNOSIS — N3281 Overactive bladder: Secondary | ICD-10-CM | POA: Diagnosis not present

## 2018-06-23 DIAGNOSIS — I10 Essential (primary) hypertension: Secondary | ICD-10-CM | POA: Diagnosis not present

## 2018-06-23 DIAGNOSIS — E039 Hypothyroidism, unspecified: Secondary | ICD-10-CM | POA: Diagnosis not present

## 2018-06-24 ENCOUNTER — Ambulatory Visit (INDEPENDENT_AMBULATORY_CARE_PROVIDER_SITE_OTHER): Payer: Medicare Other | Admitting: Podiatry

## 2018-06-24 ENCOUNTER — Encounter: Payer: Self-pay | Admitting: Podiatry

## 2018-06-24 DIAGNOSIS — B351 Tinea unguium: Secondary | ICD-10-CM | POA: Diagnosis not present

## 2018-06-24 DIAGNOSIS — M2042 Other hammer toe(s) (acquired), left foot: Secondary | ICD-10-CM

## 2018-06-24 DIAGNOSIS — M79676 Pain in unspecified toe(s): Secondary | ICD-10-CM

## 2018-06-24 NOTE — Progress Notes (Signed)
This patient presents the office for continued care on her long painful nails.  Patient says the nails are painful walking and wearing he shoes.  She is unable to self treat.  She presents the office today for evaluation and treatment of her feet   GENERAL APPEARANCE: Alert, conversant. Appropriately groomed. No acute distress.  VASCULAR: Pedal pulses are  palpable at  [facility] and PT bilateral.  Capillary refill time is immediate to all digits,  Normal temperature gradient.  Digital hair growth is present bilateral  NEUROLOGIC: sensation is normal to 5.07 monofilament at 5/5 sites bilateral.  Light touch is intact bilateral, Muscle strength normal.  MUSCULOSKELETAL: acceptable muscle strength, tone and stability bilateral.  Intrinsic muscluature intact bilateral.  Rectus appearance of foot and digits noted bilateral.   DERMATOLOGIC: skin color, texture, and turgor are within normal limits.  No preulcerative lesions or ulcers  are seen, no interdigital maceration noted.  No open lesions present.   No drainage noted.   NAILS  Thick disfigured discolored nails hallux B/L.     DX  Onychomycosis B/L     Tx.  Debridement and grinding of nails  . . RTC 3 months   Amye Grego DPM  

## 2018-07-02 DIAGNOSIS — M6281 Muscle weakness (generalized): Secondary | ICD-10-CM | POA: Diagnosis not present

## 2018-07-02 DIAGNOSIS — R2689 Other abnormalities of gait and mobility: Secondary | ICD-10-CM | POA: Diagnosis not present

## 2018-07-03 ENCOUNTER — Ambulatory Visit
Admission: RE | Admit: 2018-07-03 | Discharge: 2018-07-03 | Disposition: A | Discharge status: Home / Self Care | Payer: Medicare Other | Source: Ambulatory Visit | Attending: Orthopedic Surgery | Admitting: Orthopedic Surgery

## 2018-07-03 DIAGNOSIS — M19012 Primary osteoarthritis, left shoulder: Secondary | ICD-10-CM | POA: Diagnosis not present

## 2018-07-03 DIAGNOSIS — M25512 Pain in left shoulder: Secondary | ICD-10-CM

## 2018-07-06 ENCOUNTER — Telehealth (INDEPENDENT_AMBULATORY_CARE_PROVIDER_SITE_OTHER): Payer: Self-pay | Admitting: Orthopedic Surgery

## 2018-07-06 NOTE — Telephone Encounter (Signed)
Patient left a message stating that she would like to be admitted to [facility] after her surgery.  CB#[phone].  Thank you.

## 2018-07-08 ENCOUNTER — Telehealth (INDEPENDENT_AMBULATORY_CARE_PROVIDER_SITE_OTHER): Payer: Self-pay | Admitting: Orthopedic Surgery

## 2018-07-08 NOTE — Telephone Encounter (Signed)
Patient called stating that she did have her CT of her shoulder this past Friday.  She is wanting to go to [facility] transitional rehab after her surgery.  She is not wanting to have this surgery until she is certain that [facility] has space for her and would like this to be set up prior to her having surgery.  CB#[phone].  Thank you.

## 2018-07-08 NOTE — Telephone Encounter (Signed)
Before patient can discuss recovery options, she needs to return to see Dr. Marlou Sa post CT Scan (this is according to his notes). I do not have a surgery sheet at this time. I will contact the facility and provide them with patients facesheet once I have the surgery sheet.  It's important to have a date and know that the patient is cleared for surgery before contacting [facility]. If the patient wants to call  [facility] and discuss space availability,  I can certainly work toward a surgery date that will coordinate with the time frame of availability at [facility].

## 2018-07-09 NOTE — Telephone Encounter (Signed)
Left message on patients voicemail advising below information. Waiting on call back from patient to schedule CT review.

## 2018-07-09 NOTE — Telephone Encounter (Signed)
Patient scheduled for CT Review Wed. 08/05/18

## 2018-07-16 DIAGNOSIS — R2689 Other abnormalities of gait and mobility: Secondary | ICD-10-CM | POA: Diagnosis not present

## 2018-07-16 DIAGNOSIS — M6281 Muscle weakness (generalized): Secondary | ICD-10-CM | POA: Diagnosis not present

## 2018-08-03 DIAGNOSIS — M3390 Dermatopolymyositis, unspecified, organ involvement unspecified: Secondary | ICD-10-CM | POA: Diagnosis not present

## 2018-08-03 DIAGNOSIS — M150 Primary generalized (osteo)arthritis: Secondary | ICD-10-CM | POA: Diagnosis not present

## 2018-08-03 DIAGNOSIS — S81802D Unspecified open wound, left lower leg, subsequent encounter: Secondary | ICD-10-CM | POA: Diagnosis not present

## 2018-08-03 DIAGNOSIS — M48061 Spinal stenosis, lumbar region without neurogenic claudication: Secondary | ICD-10-CM | POA: Diagnosis not present

## 2018-08-03 DIAGNOSIS — M62838 Other muscle spasm: Secondary | ICD-10-CM | POA: Diagnosis not present

## 2018-08-05 ENCOUNTER — Encounter (INDEPENDENT_AMBULATORY_CARE_PROVIDER_SITE_OTHER): Payer: Self-pay | Admitting: Orthopedic Surgery

## 2018-08-05 ENCOUNTER — Ambulatory Visit (INDEPENDENT_AMBULATORY_CARE_PROVIDER_SITE_OTHER): Payer: Medicare Other | Admitting: Orthopedic Surgery

## 2018-08-05 DIAGNOSIS — M19012 Primary osteoarthritis, left shoulder: Secondary | ICD-10-CM | POA: Diagnosis not present

## 2018-08-05 NOTE — Progress Notes (Addendum)
Office Visit Note   Patient: Janice [name]           Date of Birth: 09-21-1940           MRN: [id number] Visit Date: 08/05/2018 Requested by: Carol Ada, [place], [place] [address] PCP: Carol Ada, MD  Subjective: Chief Complaint  Patient presents with  . Left Shoulder - Follow-up    HPI: Patient presents for follow-up of left shoulder CT scan.  She would like to have reverse shoulder replacement for her shoulder arthritis.  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  She does use a walker.  I have observed her using the walker and her shoulder is somewhat weightbearing but not severely weightbearing.  Most of her weightbearing in the shoulder occurs when she is rising from a chair.  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  She has a history of MRSA infection in the past.  She will need to have 2 to 3 weeks of rehab at [facility] after shoulder surgery.  Patient is on the following medications.  Singulair Prilosec Tums Aleve aspirin 81 mg Toviaz Benicar Synthroid magnesium vitamin D alpha lipoic acid guaifenesin multivitamins and micro [place]              ROS: All systems reviewed are negative as they relate to the chief complaint within the history of present illness.  Patient denies  fevers or chills.   Assessment & Plan: Visit Diagnoses:  1. Arthritis of left shoulder region     Plan: Impression is left shoulder pain with severe arthritis and CT scan showing a small glenoid vault but I think that it is feasible to place a baseplate on the vault.  The risk and benefits of surgical intervention are discussed including not limited to infection nerve vessel damage incomplete pain relief as well as dislocation.  Patient understands risk benefits.  We will plan to use vancomycin preoperatively postoperatively as well as intraoperatively in the incision.  I think  she will need 2 to 3-day hospital stay plus skilled nursing after that replacement.  Patient understands risk benefits and wished to proceed.  All questions answered  Follow-Up Instructions: No follow-ups on file.   Orders:  No orders of the defined types were placed in this encounter.  No orders of the defined types were placed in this encounter.     Procedures: No procedures performed   Clinical Data: No additional findings.  Objective: Vital Signs: There were no vitals taken for this visit.  Physical Exam:   Constitutional: Patient appears well-developed HEENT:  Head: Normocephalic Eyes:EOM are normal Neck: Normal range of motion Cardiovascular: Normal rate Pulmonary/chest: Effort normal Neurologic: Patient is alert Skin: Skin is warm Psychiatric: Patient has normal mood and affect    Ortho Exam: Ortho exam demonstrates intact motor or sensory function to the left hand.  Deltoid is functional.  She has very limited external rotation on the left not even to neutral.  Forward flexion abduction both below 90 degrees.  Radial pulse is intact.  Specialty Comments:  No specialty comments available.  Imaging: No results found.   PMFS History: Patient Active Problem List   Diagnosis Date Noted  . Left leg cellulitis 03/14/2017  . Essential hypertension 03/13/2017  . Cellulitis of left leg 03/13/2017  . Hypothyroidism   . Dermatomyositis ([place])   . Asthma   .  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 Symptomatic cholelithiasis 03/25/2014   Past Medical History:  Diagnosis Date  . Anemia    rec'd her own blood in the past postop x2   . Arthritis    OSA- knees & shoulders   . Asthma   . Cellulitis and abscess of left leg 03/13/2017  . 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 Family history of anesthesia complication    SISTER WAKES UP FIGHTING "  . Fibromyalgia    perhaps, not sure    . GERD (gastroesophageal reflux  disease)   . Headache    migraine history  . Heart murmur    echo 09-very mild  . Hx of appendectomy   . Hypertension, benign   . Hypothyroidism   . Muscular weakness   . Sciatic pain   . Sleep apnea    sleep study done /w Dr. Maxwell Caul, uses CPAP q night   . Spinal stenosis   . Stenosis of right carotid artery   . Trigeminal neuralgia   . Wears glasses     Family History  Problem Relation Age of Onset  . Cancer Mother   . Heart attack Father   . Cancer Father   . Diabetes Maternal Grandmother     Past Surgical History:  Procedure Laterality Date  . APPENDECTOMY    . CHOLECYSTECTOMY  04/12/2014   DR BLACMAN  . CHOLECYSTECTOMY N/A 04/12/2014   Procedure: LAPAROSCOPIC CHOLECYSTECTOMY;  Surgeon: Coralie Keens, MD;  Location: [facility];  Service: General;  Laterality: N/A;  . ESOPHAGOGASTRODUODENOSCOPY  2018  . EYE SURGERY     cataracts removed, /w IOL - bilateral   . LUMBAR FUSION  2004  . SHOULDER SURGERY  2000   right  . TONSILLECTOMY    . TOTAL HIP ARTHROPLASTY  2005   right  . TOTAL KNEE ARTHROPLASTY  2001   right  . TOTAL KNEE ARTHROPLASTY Left 02/01/2015  . TOTAL KNEE ARTHROPLASTY Left 02/01/2015   Procedure: LEFT TOTAL KNEE ARTHROPLASTY;  Surgeon: Newt Minion, MD;  Location: [facility];  Service: Orthopedics;  Laterality: Left;   Social History   Occupational History  . Not on file  Tobacco Use  . Smoking status: Never Smoker  . Smokeless tobacco: Never Used  Substance and Sexual Activity  . Alcohol use: No  . Drug use: No  . Sexual activity: Not on file

## 2018-08-26 ENCOUNTER — Encounter: Payer: Self-pay | Admitting: Podiatry

## 2018-08-26 ENCOUNTER — Ambulatory Visit (INDEPENDENT_AMBULATORY_CARE_PROVIDER_SITE_OTHER): Payer: Medicare Other | Admitting: Podiatry

## 2018-08-26 DIAGNOSIS — M79676 Pain in unspecified toe(s): Secondary | ICD-10-CM | POA: Diagnosis not present

## 2018-08-26 DIAGNOSIS — B351 Tinea unguium: Secondary | ICD-10-CM | POA: Diagnosis not present

## 2018-08-26 NOTE — Progress Notes (Signed)
This patient presents the office for continued care on her long painful nails.  Patient says the nails are painful walking and wearing he shoes.  She is unable to self treat.  She presents the office today for evaluation and treatment of her feet   GENERAL APPEARANCE: Alert, conversant. Appropriately groomed. No acute distress.  VASCULAR: Pedal pulses are  palpable at  DP and PT bilateral.  Capillary refill time is immediate to all digits,  Normal temperature gradient.  Digital hair growth is present bilateral  NEUROLOGIC: sensation is normal to 5.07 monofilament at 5/5 sites bilateral.  Light touch is intact bilateral, Muscle strength normal.  MUSCULOSKELETAL: acceptable muscle strength, tone and stability bilateral.  Intrinsic muscluature intact bilateral.  Rectus appearance of foot and digits noted bilateral.   DERMATOLOGIC: skin color, texture, and turgor are within normal limits.  No preulcerative lesions or ulcers  are seen, no interdigital maceration noted.  No open lesions present.   No drainage noted.   NAILS  Thick disfigured discolored nails hallux B/L.     DX  Onychomycosis B/L     Tx.  Debridement and grinding of nails  . . RTC 3 months   Rainer Mounce DPM  

## 2018-09-02 ENCOUNTER — Ambulatory Visit: Payer: Medicare Other | Admitting: Podiatry

## 2018-09-29 ENCOUNTER — Telehealth (INDEPENDENT_AMBULATORY_CARE_PROVIDER_SITE_OTHER): Payer: Self-pay | Admitting: Orthopedic Surgery

## 2018-09-29 NOTE — Telephone Encounter (Signed)
Patient is scheduled for right reverse shoulder arthroplasty at [facility] 10-29-2018 @12 :15pm.  [employer] contacted, CPM order faxed, demographics in folder for [facility], orders to [facility] (hx of MRSA-Vancomycin preoperatively). Patient's demographics with insurance information sent to [facility].  Patient would like to do her recovery there since she has used facility in the past.

## 2018-09-30 NOTE — Telephone Encounter (Signed)
Great thx

## 2018-10-01 ENCOUNTER — Telehealth (INDEPENDENT_AMBULATORY_CARE_PROVIDER_SITE_OTHER): Payer: Self-pay | Admitting: Orthopedic Surgery

## 2018-10-01 NOTE — Telephone Encounter (Signed)
Pt is requesting new shoes from [employer].  Pt has an apt Monday but she wanted to call and make a note.

## 2018-10-12 ENCOUNTER — Telehealth (INDEPENDENT_AMBULATORY_CARE_PROVIDER_SITE_OTHER): Payer: Self-pay | Admitting: Orthopedic Surgery

## 2018-10-12 NOTE — Telephone Encounter (Signed)
Patient is scheduled for left reverse shoulder arthroplasty on 10-29-18.  Because of the recent COVID-19 outbreak, patient would like for you to be aware of her underlying illnesses/medical history and help her make the decision as to having this surgery now as opposed to waiting.  She plans to go to [facility] after the surgey. Please call patient and advise.

## 2018-10-12 NOTE — Telephone Encounter (Signed)
I called patient regarding surgery and recovery plans.  She feels that it would be better to wait and schedule at a later date, such as May.  Will cancel surgery, cpm, and post op visit.

## 2018-10-12 NOTE — Telephone Encounter (Signed)
If it were me I would wait.  sHe might be waiting for a while but in April that appears to be when the coated virus will be peaking.  If she can stand it might be better to wait but I will abide by her wishes

## 2018-10-12 NOTE — Telephone Encounter (Signed)
Please advise. Thanks.  

## 2018-10-17 DIAGNOSIS — K047 Periapical abscess without sinus: Secondary | ICD-10-CM | POA: Diagnosis not present

## 2018-10-17 DIAGNOSIS — J069 Acute upper respiratory infection, unspecified: Secondary | ICD-10-CM | POA: Diagnosis not present

## 2018-10-28 ENCOUNTER — Ambulatory Visit: Payer: Medicare Other | Admitting: Podiatry

## 2018-10-29 ENCOUNTER — Inpatient Hospital Stay: Admit: 2018-10-29 | Payer: Medicare Other | Admitting: Orthopedic Surgery

## 2018-10-29 SURGERY — ARTHROPLASTY, SHOULDER, TOTAL, REVERSE
Anesthesia: General | Laterality: Left

## 2018-11-03 ENCOUNTER — Ambulatory Visit: Payer: Medicare Other | Admitting: Podiatry

## 2018-11-13 ENCOUNTER — Inpatient Hospital Stay (INDEPENDENT_AMBULATORY_CARE_PROVIDER_SITE_OTHER): Payer: Medicare Other | Admitting: Orthopedic Surgery

## 2019-01-15 ENCOUNTER — Telehealth: Payer: Self-pay | Admitting: Orthopedic Surgery

## 2019-01-15 NOTE — Telephone Encounter (Signed)
Rx was written and will be faxed

## 2019-01-15 NOTE — Telephone Encounter (Signed)
Patient called asked if she can get a Rx sent over to [employer] for new shoes. The number to contact patient is [phone]

## 2019-01-21 ENCOUNTER — Telehealth: Payer: Self-pay | Admitting: Orthopedic Surgery

## 2019-01-21 NOTE — Telephone Encounter (Signed)
Patient called advised [employer] informed her that the they need the medical note from her last visit verifying that she need a shoe that attaches to her brace. Patient asked if she can get a call back concerning her brace. The number to contact patient is [phone]

## 2019-01-25 NOTE — Telephone Encounter (Signed)
Note from 2018 was faxed but advised patient that she may need update notes and she will need to schedule appt to come in. Patient understood

## 2019-02-03 ENCOUNTER — Encounter: Payer: Self-pay | Admitting: Family

## 2019-02-03 ENCOUNTER — Ambulatory Visit (INDEPENDENT_AMBULATORY_CARE_PROVIDER_SITE_OTHER): Payer: Medicare Other | Admitting: Family

## 2019-02-03 ENCOUNTER — Other Ambulatory Visit: Payer: Self-pay

## 2019-02-03 DIAGNOSIS — M21371 Foot drop, right foot: Secondary | ICD-10-CM | POA: Diagnosis not present

## 2019-02-03 DIAGNOSIS — M3390 Dermatopolymyositis, unspecified, organ involvement unspecified: Secondary | ICD-10-CM

## 2019-02-03 DIAGNOSIS — M2170 Unequal limb length (acquired), unspecified site: Secondary | ICD-10-CM

## 2019-02-03 NOTE — Progress Notes (Signed)
Office Visit Note   Patient: Janice [name]           Date of Birth: 07-14-1941           MRN: [id number] Visit Date: 02/03/2019              Requested by: Carol Ada, [place],  [place] [address] PCP: Carol Ada, MD  Chief Complaint  Patient presents with  . Right Foot - Follow-up    Patient needs OV note and Rx for new shoes faxed to [employer] that shows that she was seen on the same day that the rx was written.  . Left Foot - Follow-up      HPI: Patient is a 78 year old woman who is seen today for evaluation of bilateral lower extremities.  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  She currently has a double upright brace for the right that is broken down and causing pain she fears that she will develop an ulcer from rubbing.  The sole of the left shoe is worn down.  She is having difficulty with ambulation due to breakdown of her brace.     Assessment & Plan: Visit Diagnoses:  No diagnosis found.  Plan: Patient was given a prescription for a [employer] for a new double upright brace new extra-depth shoes new custom orthotics and posting to provide better stability with her foot drop.  She will also need a lift for the left.  Follow-Up Instructions: No follow-ups on file.   Ortho Exam  Patient is alert, oriented, no adenopathy, well-dressed, normal affect, normal respiratory effort. Examination patient ambulates with a rolling walker.  She has foot drop on the right her foot is plantigrade.  Her brace is broken down. She has no venous stasis ulcers.  No wounds no erythema no sign of infection  Imaging: No results found. No images are attached to the encounter.  Labs: Lab Results  Component Value Date   CRP <0.8 03/13/2017   REPTSTATUS 03/18/2017 FINAL 03/13/2017   CULT NO GROWTH 5 DAYS 03/13/2017     @LABSALLVALUES (HGBA1)@  @BMI1 @  Orders:  No orders of the defined types were placed in this encounter.  No orders of the defined types were placed in this encounter.    Procedures: No procedures performed  Clinical Data: No additional findings.  ROS:  All other systems negative, except as noted in the HPI. Review of Systems  Constitutional: Negative for chills and fever.  Musculoskeletal: Positive for myalgias.  Skin: Negative for color change and wound.  Neurological: Positive for weakness.    Objective: Vital Signs: There were no vitals taken for this visit.  Specialty Comments:  No specialty comments available.  PMFS History: Patient Active Problem List   Diagnosis Date Noted  . Left leg cellulitis 03/14/2017  . Essential hypertension 03/13/2017  . Cellulitis of left leg 03/13/2017  . Hypothyroidism   . Dermatomyositis ([place])   . Asthma   . 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 Symptomatic cholelithiasis 03/25/2014   Past Medical History:  Diagnosis Date  . Anemia    rec'd her own blood in the past postop x2   . Arthritis    OSA- knees & shoulders   . Asthma   . Cellulitis and abscess of left leg 03/13/2017  . Dermatomyositis (HCC)    affecting muscle, currently in  remission    . Family history of anesthesia complication    SISTER WAKES UP FIGHTING "  . Fibromyalgia    perhaps, not sure    . GERD (gastroesophageal reflux disease)   . Headache    migraine history  . Heart murmur    echo 09-very mild  . Hx of appendectomy   . Hypertension, benign   . Hypothyroidism   . Muscular weakness   . Sciatic pain   . Sleep apnea    sleep study done /w Dr. Maxwell Caul, uses CPAP q night   . Spinal stenosis   . Stenosis of right carotid artery   . Trigeminal neuralgia   . Wears glasses     Family History  Problem Relation Age of Onset  . Cancer Mother   . Heart attack Father   . Cancer Father   . Diabetes Maternal Grandmother     Past Surgical  History:  Procedure Laterality Date  . APPENDECTOMY    . CHOLECYSTECTOMY  04/12/2014   DR BLACMAN  . CHOLECYSTECTOMY N/A 04/12/2014   Procedure: LAPAROSCOPIC CHOLECYSTECTOMY;  Surgeon: Coralie Keens, MD;  Location: [facility];  Service: General;  Laterality: N/A;  . ESOPHAGOGASTRODUODENOSCOPY  2018  . EYE SURGERY     cataracts removed, /w IOL - bilateral   . LUMBAR FUSION  2004  . SHOULDER SURGERY  2000   right  . TONSILLECTOMY    . TOTAL HIP ARTHROPLASTY  2005   right  . TOTAL KNEE ARTHROPLASTY  2001   right  . TOTAL KNEE ARTHROPLASTY Left 02/01/2015  . TOTAL KNEE ARTHROPLASTY Left 02/01/2015   Procedure: LEFT TOTAL KNEE ARTHROPLASTY;  Surgeon: Newt Minion, MD;  Location: [facility];  Service: Orthopedics;  Laterality: Left;   Social History   Occupational History  . Not on file  Tobacco Use  . Smoking status: Never Smoker  . Smokeless tobacco: Never Used  Substance and Sexual Activity  . Alcohol use: No  . Drug use: No  . Sexual activity: Not on file

## 2019-02-05 ENCOUNTER — Other Ambulatory Visit: Payer: Self-pay

## 2019-02-05 ENCOUNTER — Encounter: Payer: Self-pay | Admitting: Podiatry

## 2019-02-05 ENCOUNTER — Ambulatory Visit (INDEPENDENT_AMBULATORY_CARE_PROVIDER_SITE_OTHER): Payer: Medicare Other | Admitting: Podiatry

## 2019-02-05 VITALS — Temp 97.7°F

## 2019-02-05 DIAGNOSIS — M79676 Pain in unspecified toe(s): Secondary | ICD-10-CM

## 2019-02-05 DIAGNOSIS — B351 Tinea unguium: Secondary | ICD-10-CM

## 2019-02-05 NOTE — Progress Notes (Signed)
This patient presents the office for continued care on her long painful nails.  Patient says the nails are painful walking and wearing he shoes.  She is unable to self treat.  She presents the office today for evaluation and treatment of her feet   GENERAL APPEARANCE: Alert, conversant. Appropriately groomed. No acute distress.  VASCULAR: Pedal pulses are  palpable at  [facility] and PT bilateral.  Capillary refill time is immediate to all digits,  Normal temperature gradient.  Digital hair growth is present bilateral  NEUROLOGIC: sensation is normal to 5.07 monofilament at 5/5 sites bilateral.  Light touch is intact bilateral, Muscle strength normal.  MUSCULOSKELETAL: acceptable muscle strength, tone and stability bilateral.  Intrinsic muscluature intact bilateral.  Rectus appearance of foot and digits noted bilateral.   DERMATOLOGIC: skin color, texture, and turgor are within normal limits.  No preulcerative lesions or ulcers  are seen, no interdigital maceration noted.  No open lesions present.   No drainage noted.   NAILS  Thick disfigured discolored nails hallux B/L.     DX  Onychomycosis B/L     Tx.  Debridement and grinding of nails  . Marland Kitchen RTC 3 months   Gardiner Barefoot DPM

## 2019-02-10 ENCOUNTER — Telehealth: Payer: Self-pay | Admitting: Orthopedic Surgery

## 2019-02-10 NOTE — Telephone Encounter (Signed)
Patient is not ready to schedule Left Reverse Shoulder Arthroplasty because of Covid-19.  She will need to go to a skilled nursing facility for post operative care.  If patient decides in the near future to schedule this surgery, she will need to do a repeat scan because the block will expire per [facility] with [employer].  Patient is aware of this.

## 2019-02-11 NOTE — Telephone Encounter (Signed)
thx

## 2019-02-11 NOTE — Telephone Encounter (Signed)
FYI

## 2019-02-24 DIAGNOSIS — M3390 Dermatopolymyositis, unspecified, organ involvement unspecified: Secondary | ICD-10-CM | POA: Diagnosis not present

## 2019-04-13 DIAGNOSIS — G4733 Obstructive sleep apnea (adult) (pediatric): Secondary | ICD-10-CM | POA: Diagnosis not present

## 2019-04-19 ENCOUNTER — Ambulatory Visit (INDEPENDENT_AMBULATORY_CARE_PROVIDER_SITE_OTHER): Payer: Medicare Other | Admitting: Podiatry

## 2019-04-19 ENCOUNTER — Other Ambulatory Visit: Payer: Self-pay

## 2019-04-19 DIAGNOSIS — I872 Venous insufficiency (chronic) (peripheral): Secondary | ICD-10-CM | POA: Insufficient documentation

## 2019-04-19 DIAGNOSIS — B351 Tinea unguium: Secondary | ICD-10-CM

## 2019-04-19 DIAGNOSIS — E785 Hyperlipidemia, unspecified: Secondary | ICD-10-CM | POA: Insufficient documentation

## 2019-04-19 DIAGNOSIS — E230 Hypopituitarism: Secondary | ICD-10-CM | POA: Insufficient documentation

## 2019-04-19 DIAGNOSIS — J309 Allergic rhinitis, unspecified: Secondary | ICD-10-CM | POA: Insufficient documentation

## 2019-04-19 DIAGNOSIS — E063 Autoimmune thyroiditis: Secondary | ICD-10-CM | POA: Insufficient documentation

## 2019-04-19 DIAGNOSIS — M79676 Pain in unspecified toe(s): Secondary | ICD-10-CM | POA: Diagnosis not present

## 2019-04-19 DIAGNOSIS — B369 Superficial mycosis, unspecified: Secondary | ICD-10-CM

## 2019-04-19 DIAGNOSIS — M1990 Unspecified osteoarthritis, unspecified site: Secondary | ICD-10-CM | POA: Insufficient documentation

## 2019-04-19 DIAGNOSIS — M792 Neuralgia and neuritis, unspecified: Secondary | ICD-10-CM | POA: Insufficient documentation

## 2019-04-19 DIAGNOSIS — R1310 Dysphagia, unspecified: Secondary | ICD-10-CM | POA: Insufficient documentation

## 2019-04-19 DIAGNOSIS — E041 Nontoxic single thyroid nodule: Secondary | ICD-10-CM | POA: Insufficient documentation

## 2019-04-19 HISTORY — DX: Superficial mycosis, unspecified: B36.9

## 2019-04-19 NOTE — Progress Notes (Signed)
Subjective:  [name], female    DOB: [birth year],  MRN: [id number]  Chief Complaint  Patient presents with  . debride    BL routine nail care   78 y.o. female presents with the above complaint.  Reports painfully elongated nails to both feet. Has neuromuscular disorder and cannot care for them herself.  Review of Systems: Negative except as noted in the HPI. Denies N/V/F/Ch.  Past Medical History:  Diagnosis Date  . Anemia    rec'd her own blood in the past postop x2   . Arthritis    OSA- knees & shoulders   . Asthma   . Cellulitis and abscess of left leg 03/13/2017  . Dermatomycosis 04/19/2019  . 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 Family history of anesthesia complication    SISTER WAKES UP FIGHTING "  . Fibromyalgia    perhaps, not sure    . GERD (gastroesophageal reflux disease)   . Headache    migraine history  . Heart murmur    echo 09-very mild  . Hx of appendectomy   . Hypertension, benign   . Hypothyroidism   . Muscular weakness   . Sciatic pain   . Sleep apnea    sleep study done /w Dr. Maxwell Caul, uses CPAP q night   . Spinal stenosis   . Stenosis of right carotid artery   . Trigeminal neuralgia   . Wears glasses     Current Outpatient Medications:  .  albuterol (PROVENTIL HFA;VENTOLIN HFA) 108 (90 Base) MCG/ACT inhaler, Inhale 1-2 puffs into the lungs every 6 (six) hours as needed for wheezing or shortness of breath., Disp: , Rfl:  .  Alpha-Lipoic Acid 300 MG CAPS, Take 300 mg by mouth daily., Disp: , Rfl:  .  aspirin 81 MG tablet, Take 81 mg by mouth daily., Disp: , Rfl:  .  BENICAR 40 MG tablet, Take 40 mg by mouth daily with breakfast. , Disp: , Rfl:  .  BIOTIN PO, Take 1 tablet by mouth daily., Disp: , Rfl:  .  CALCIUM PO, Take 500 mg by mouth daily. , Disp: , Rfl:  .  Cholecalciferol (VITAMIN D) 2000 UNITS CAPS, Take 2,000 Units by mouth 2 (two) times daily., Disp: , Rfl:  .  clindamycin (CLEOCIN)  300 MG capsule, TAKE 1 CAPSULE BY MOUTH EVERY 8 HOURS FOR 7 DAYS, Disp: , Rfl: 1 .  fesoterodine (TOVIAZ) 8 MG TB24 tablet, Take 8 mg by mouth daily., Disp: , Rfl:  .  fluticasone (FLONASE) 50 MCG/ACT nasal spray, Place 1 spray into both nostrils daily as needed for allergies or rhinitis., Disp: , Rfl:  .  folic acid (FOLVITE) 1 MG tablet, Take 1 mg by mouth daily., Disp: , Rfl:  .  guaiFENesin (MUCINEX) 600 MG 12 hr tablet, Take 600 mg by mouth as needed (congestion). , Disp: , Rfl:  .  levothyroxine (SYNTHROID, LEVOTHROID) 112 MCG tablet, Take by mouth., Disp: , Rfl:  .  MAGNESIUM PO, Take 0.5 tablets by mouth 3 (three) times daily. , Disp: , Rfl:  .  montelukast (SINGULAIR) 10 MG tablet, Take 10 mg by mouth daily. , Disp: , Rfl:  .  Multiple Vitamins-Minerals (MULTIVITAMIN ADULT PO), Take 1 tablet by mouth daily. , Disp: , Rfl:  .  naproxen sodium (ANAPROX) 220 MG tablet, Take 220 mg by mouth 3 (three) times daily as needed (pain). , Disp: , Rfl:  .  UNABLE  TO FIND, Take by mouth. Multv. daily, Disp: , Rfl:  .  UNABLE TO FIND, Take by mouth., Disp: , Rfl:  .  UNABLE TO FIND, Take by mouth., Disp: , Rfl:   Social History   Tobacco Use  Smoking Status Never Smoker  Smokeless Tobacco Never Used    Allergies  Allergen Reactions  . Ciprofloxacin Other (See Comments)    Confusion w/psychosis Confusion-in combination with another medicine Muscle spasms Confusion w/psychosis  . Gabapentin Other (See Comments)    confusion Muscle jerking confusion Muscle jerking Muscle jerking confusion  confusion Muscle jerking Muscle jerking  . Prednisone Anaphylaxis and Other (See Comments)    psychosis confusion  . Amlodipine Besylate Other (See Comments)    Muscle weakness Muscle weakness Muscle weakness Muscle weakness Muscle weakness  . Atorvastatin Other (See Comments)    Muscle cramps/ muscle weakness Muscle weakness Muscle cramps/ muscle weakness Muscle weakness Muscle cramps/  muscle weakness Muscle cramps/ muscle weakness  Muscle cramps/ muscle weakness Muscle weakness Muscle cramps/ muscle weakness  . Baclofen Other (See Comments)    Frequent urination, weakness Frequent urination, weakness  . Colesevelam Other (See Comments)    Decreased energy   . Furosemide Other (See Comments)    Muscle weakness Muscle weakness Muscle weakness  . Irbesartan Other (See Comments)    Muscle weakness, fatigue -- States she can only take Benicar Muscle weakness, fatigue -- States she can only take Benicar Muscle weakness, fatigue -- States she can only take Benicar  . Myrbetriq [Mirabegron] Other (See Comments)    Blurred vision   . Novocain [Procaine]     Does not work  . Ranitidine     Pt. Knows that it can cause: Confusion & agitation, she is not  willing to take the risk of taking this med.   . Sulfa Antibiotics Other (See Comments)    swelling As a child-high fever  . Sulfamethoxazole     High fever  . Tizanidine     Confusion   . Tramadol Other (See Comments)    Confusion w/psychosis confusion  . Welchol [Colesevelam Hcl] Other (See Comments)    Muscle weakness   . Penicillins Hives, Rash and Other (See Comments)    Hives  Hives   Objective:  There were no vitals filed for this visit. There is no height or weight on file to calculate BMI. Constitutional Well developed. Well nourished.  Vascular Dorsalis pedis pulses palpable bilaterally. Posterior tibial pulses palpable bilaterally. Capillary refill normal to all digits.  No cyanosis or clubbing noted. Pedal hair growth normal.  Neurologic Normal speech. Oriented to person, place, and time. Epicritic sensation to light touch grossly present bilaterally.  Dermatologic Nails elongated dystrophic pain to palpation No open wounds. No skin lesions.  Orthopedic: Normal joint ROM without pain or crepitus bilaterally. No visible deformities. No bony tenderness.   Radiographs: None Assessment:    1. Pain due to onychomycosis of toenail    Plan:  Patient was evaluated and treated and all questions answered.  Onychomycosis with pain -Nails palliatively debridement as below -Educated on self-care  Procedure: Nail Debridement Rationale: Pain Type of Debridement: manual, sharp debridement. Instrumentation: Nail nipper, rotary burr. Number of Nails: 10    No follow-ups on file.

## 2019-04-26 DIAGNOSIS — E049 Nontoxic goiter, unspecified: Secondary | ICD-10-CM | POA: Diagnosis not present

## 2019-04-26 DIAGNOSIS — E039 Hypothyroidism, unspecified: Secondary | ICD-10-CM | POA: Diagnosis not present

## 2019-04-27 DIAGNOSIS — M48061 Spinal stenosis, lumbar region without neurogenic claudication: Secondary | ICD-10-CM | POA: Diagnosis not present

## 2019-04-27 DIAGNOSIS — M150 Primary generalized (osteo)arthritis: Secondary | ICD-10-CM | POA: Diagnosis not present

## 2019-04-27 DIAGNOSIS — M62838 Other muscle spasm: Secondary | ICD-10-CM | POA: Diagnosis not present

## 2019-04-27 DIAGNOSIS — S81802D Unspecified open wound, left lower leg, subsequent encounter: Secondary | ICD-10-CM | POA: Diagnosis not present

## 2019-04-27 DIAGNOSIS — E038 Other specified hypothyroidism: Secondary | ICD-10-CM | POA: Diagnosis not present

## 2019-04-27 DIAGNOSIS — E559 Vitamin D deficiency, unspecified: Secondary | ICD-10-CM | POA: Diagnosis not present

## 2019-04-27 DIAGNOSIS — M3390 Dermatopolymyositis, unspecified, organ involvement unspecified: Secondary | ICD-10-CM | POA: Diagnosis not present

## 2019-04-30 ENCOUNTER — Other Ambulatory Visit: Payer: Self-pay | Admitting: Endocrinology

## 2019-04-30 DIAGNOSIS — E049 Nontoxic goiter, unspecified: Secondary | ICD-10-CM

## 2019-05-05 DIAGNOSIS — Z23 Encounter for immunization: Secondary | ICD-10-CM | POA: Diagnosis not present

## 2019-05-07 ENCOUNTER — Ambulatory Visit
Admission: RE | Admit: 2019-05-07 | Discharge: 2019-05-07 | Disposition: A | Discharge status: Home / Self Care | Payer: Medicare Other | Source: Ambulatory Visit | Attending: Endocrinology | Admitting: Endocrinology

## 2019-05-07 DIAGNOSIS — E041 Nontoxic single thyroid nodule: Secondary | ICD-10-CM | POA: Diagnosis not present

## 2019-05-07 DIAGNOSIS — E049 Nontoxic goiter, unspecified: Secondary | ICD-10-CM

## 2019-05-13 ENCOUNTER — Other Ambulatory Visit: Payer: Self-pay | Admitting: Endocrinology

## 2019-05-13 DIAGNOSIS — E049 Nontoxic goiter, unspecified: Secondary | ICD-10-CM

## 2019-05-17 DIAGNOSIS — I10 Essential (primary) hypertension: Secondary | ICD-10-CM | POA: Diagnosis not present

## 2019-05-17 DIAGNOSIS — E7800 Pure hypercholesterolemia, unspecified: Secondary | ICD-10-CM | POA: Diagnosis not present

## 2019-05-17 DIAGNOSIS — E039 Hypothyroidism, unspecified: Secondary | ICD-10-CM | POA: Diagnosis not present

## 2019-05-17 DIAGNOSIS — R600 Localized edema: Secondary | ICD-10-CM | POA: Diagnosis not present

## 2019-05-17 DIAGNOSIS — N3281 Overactive bladder: Secondary | ICD-10-CM | POA: Diagnosis not present

## 2019-05-18 DIAGNOSIS — M3390 Dermatopolymyositis, unspecified, organ involvement unspecified: Secondary | ICD-10-CM | POA: Diagnosis not present

## 2019-05-18 DIAGNOSIS — E038 Other specified hypothyroidism: Secondary | ICD-10-CM | POA: Diagnosis not present

## 2019-05-18 DIAGNOSIS — E559 Vitamin D deficiency, unspecified: Secondary | ICD-10-CM | POA: Diagnosis not present

## 2019-05-25 DIAGNOSIS — M62838 Other muscle spasm: Secondary | ICD-10-CM | POA: Diagnosis not present

## 2019-05-25 DIAGNOSIS — M3390 Dermatopolymyositis, unspecified, organ involvement unspecified: Secondary | ICD-10-CM | POA: Diagnosis not present

## 2019-05-25 DIAGNOSIS — M150 Primary generalized (osteo)arthritis: Secondary | ICD-10-CM | POA: Diagnosis not present

## 2019-05-25 DIAGNOSIS — M48061 Spinal stenosis, lumbar region without neurogenic claudication: Secondary | ICD-10-CM | POA: Diagnosis not present

## 2019-05-25 DIAGNOSIS — S81802D Unspecified open wound, left lower leg, subsequent encounter: Secondary | ICD-10-CM | POA: Diagnosis not present

## 2019-05-25 DIAGNOSIS — E038 Other specified hypothyroidism: Secondary | ICD-10-CM | POA: Diagnosis not present

## 2019-06-04 ENCOUNTER — Ambulatory Visit: Payer: Medicare Other | Admitting: Podiatry

## 2019-06-09 ENCOUNTER — Ambulatory Visit: Payer: Medicare Other | Admitting: Podiatry

## 2019-06-23 ENCOUNTER — Other Ambulatory Visit: Payer: Self-pay

## 2019-06-23 ENCOUNTER — Encounter: Payer: Self-pay | Admitting: Podiatry

## 2019-06-23 ENCOUNTER — Ambulatory Visit (INDEPENDENT_AMBULATORY_CARE_PROVIDER_SITE_OTHER): Payer: Medicare Other | Admitting: Podiatry

## 2019-06-23 DIAGNOSIS — Q828 Other specified congenital malformations of skin: Secondary | ICD-10-CM | POA: Diagnosis not present

## 2019-06-23 DIAGNOSIS — B351 Tinea unguium: Secondary | ICD-10-CM | POA: Diagnosis not present

## 2019-06-23 DIAGNOSIS — M79676 Pain in unspecified toe(s): Secondary | ICD-10-CM | POA: Diagnosis not present

## 2019-06-23 NOTE — Progress Notes (Signed)
This patient presents the office for continued care on her long painful nails.  Patient says the nails are painful walking and wearing he shoes.  She is unable to self treat.  She presents the office today for evaluation and treatment of her feet   GENERAL APPEARANCE: Alert, conversant. Appropriately groomed. No acute distress.  VASCULAR: Pedal pulses are  palpable at  [facility] and PT bilateral.  Capillary refill time is immediate to all digits,  Normal temperature gradient.  Digital hair growth is present bilateral  NEUROLOGIC: sensation is normal to 5.07 monofilament at 5/5 sites bilateral.  Light touch is intact bilateral, Muscle strength normal.  MUSCULOSKELETAL: acceptable muscle strength, tone and stability bilateral.  Intrinsic muscluature intact bilateral.  Rectus appearance of foot and digits noted bilateral.   DERMATOLOGIC: skin color, texture, and turgor are within normal limits.  No preulcerative lesions or ulcers  are seen, no interdigital maceration noted.  No open lesions present.   No drainage noted.  Porokeratotic lesion lateral fifth metabase right foot.  NAILS  Thick disfigured discolored nails hallux B/L.     DX  Onychomycosis B/L     Tx.  Debridement and grinding of nails  . Marland Kitchen RTC 10 weeks    Gardiner Barefoot DPM

## 2019-08-12 DIAGNOSIS — I890 Lymphedema, not elsewhere classified: Secondary | ICD-10-CM | POA: Diagnosis not present

## 2019-08-12 DIAGNOSIS — Z6841 Body Mass Index (BMI) 40.0 and over, adult: Secondary | ICD-10-CM | POA: Diagnosis not present

## 2019-08-12 DIAGNOSIS — I10 Essential (primary) hypertension: Secondary | ICD-10-CM | POA: Diagnosis not present

## 2019-08-12 DIAGNOSIS — I87311 Chronic venous hypertension (idiopathic) with ulcer of right lower extremity: Secondary | ICD-10-CM | POA: Diagnosis not present

## 2019-08-12 DIAGNOSIS — I87331 Chronic venous hypertension (idiopathic) with ulcer and inflammation of right lower extremity: Secondary | ICD-10-CM | POA: Diagnosis not present

## 2019-08-12 DIAGNOSIS — L97812 Non-pressure chronic ulcer of other part of right lower leg with fat layer exposed: Secondary | ICD-10-CM | POA: Diagnosis not present

## 2019-08-20 DIAGNOSIS — I890 Lymphedema, not elsewhere classified: Secondary | ICD-10-CM | POA: Diagnosis not present

## 2019-08-20 DIAGNOSIS — L97812 Non-pressure chronic ulcer of other part of right lower leg with fat layer exposed: Secondary | ICD-10-CM | POA: Diagnosis not present

## 2019-08-20 DIAGNOSIS — I10 Essential (primary) hypertension: Secondary | ICD-10-CM | POA: Diagnosis not present

## 2019-08-20 DIAGNOSIS — I87311 Chronic venous hypertension (idiopathic) with ulcer of right lower extremity: Secondary | ICD-10-CM | POA: Diagnosis not present

## 2019-08-20 DIAGNOSIS — Z6841 Body Mass Index (BMI) 40.0 and over, adult: Secondary | ICD-10-CM | POA: Diagnosis not present

## 2019-09-03 DIAGNOSIS — I890 Lymphedema, not elsewhere classified: Secondary | ICD-10-CM | POA: Diagnosis not present

## 2019-09-03 DIAGNOSIS — I87311 Chronic venous hypertension (idiopathic) with ulcer of right lower extremity: Secondary | ICD-10-CM | POA: Diagnosis not present

## 2019-09-03 DIAGNOSIS — L97812 Non-pressure chronic ulcer of other part of right lower leg with fat layer exposed: Secondary | ICD-10-CM | POA: Diagnosis not present

## 2019-09-03 DIAGNOSIS — I10 Essential (primary) hypertension: Secondary | ICD-10-CM | POA: Diagnosis not present

## 2019-09-13 DIAGNOSIS — L97812 Non-pressure chronic ulcer of other part of right lower leg with fat layer exposed: Secondary | ICD-10-CM | POA: Diagnosis not present

## 2019-09-13 DIAGNOSIS — Z6841 Body Mass Index (BMI) 40.0 and over, adult: Secondary | ICD-10-CM | POA: Diagnosis not present

## 2019-09-13 DIAGNOSIS — I10 Essential (primary) hypertension: Secondary | ICD-10-CM | POA: Diagnosis not present

## 2019-09-13 DIAGNOSIS — I890 Lymphedema, not elsewhere classified: Secondary | ICD-10-CM | POA: Diagnosis not present

## 2019-09-13 DIAGNOSIS — I87311 Chronic venous hypertension (idiopathic) with ulcer of right lower extremity: Secondary | ICD-10-CM | POA: Diagnosis not present

## 2019-09-20 DIAGNOSIS — I10 Essential (primary) hypertension: Secondary | ICD-10-CM | POA: Diagnosis not present

## 2019-09-20 DIAGNOSIS — L97812 Non-pressure chronic ulcer of other part of right lower leg with fat layer exposed: Secondary | ICD-10-CM | POA: Diagnosis not present

## 2019-09-20 DIAGNOSIS — I890 Lymphedema, not elsewhere classified: Secondary | ICD-10-CM | POA: Diagnosis not present

## 2019-09-20 DIAGNOSIS — Z6841 Body Mass Index (BMI) 40.0 and over, adult: Secondary | ICD-10-CM | POA: Diagnosis not present

## 2019-09-20 DIAGNOSIS — I87311 Chronic venous hypertension (idiopathic) with ulcer of right lower extremity: Secondary | ICD-10-CM | POA: Diagnosis not present

## 2019-09-27 DIAGNOSIS — L97812 Non-pressure chronic ulcer of other part of right lower leg with fat layer exposed: Secondary | ICD-10-CM | POA: Diagnosis not present

## 2019-09-27 DIAGNOSIS — I890 Lymphedema, not elsewhere classified: Secondary | ICD-10-CM | POA: Diagnosis not present

## 2019-09-27 DIAGNOSIS — I87311 Chronic venous hypertension (idiopathic) with ulcer of right lower extremity: Secondary | ICD-10-CM | POA: Diagnosis not present

## 2019-09-27 DIAGNOSIS — I10 Essential (primary) hypertension: Secondary | ICD-10-CM | POA: Diagnosis not present

## 2019-09-27 DIAGNOSIS — Z6841 Body Mass Index (BMI) 40.0 and over, adult: Secondary | ICD-10-CM | POA: Diagnosis not present

## 2019-10-04 DIAGNOSIS — Z6841 Body Mass Index (BMI) 40.0 and over, adult: Secondary | ICD-10-CM | POA: Diagnosis not present

## 2019-10-04 DIAGNOSIS — I10 Essential (primary) hypertension: Secondary | ICD-10-CM | POA: Diagnosis not present

## 2019-10-04 DIAGNOSIS — I87311 Chronic venous hypertension (idiopathic) with ulcer of right lower extremity: Secondary | ICD-10-CM | POA: Diagnosis not present

## 2019-10-04 DIAGNOSIS — L97812 Non-pressure chronic ulcer of other part of right lower leg with fat layer exposed: Secondary | ICD-10-CM | POA: Diagnosis not present

## 2019-10-04 DIAGNOSIS — I890 Lymphedema, not elsewhere classified: Secondary | ICD-10-CM | POA: Diagnosis not present

## 2019-10-05 ENCOUNTER — Telehealth: Payer: Self-pay | Admitting: Orthopedic Surgery

## 2019-10-05 NOTE — Telephone Encounter (Signed)
Patient called to see if a form she left here from [facility] of [employer] form for Dept of [employer] for Medical review branch.  Has to be filled out and returned to them within 74. She left a stamped envelope to send it in as well as one to be mailed back to her.  Stated she left it on 09/23/19 at the desk as soon as you enter the building. Please call patient to advise.  [phone]

## 2019-10-06 NOTE — Telephone Encounter (Signed)
Called pt and advised that forms have been completed and are in the mail today.

## 2019-10-29 IMAGING — US US THYROID
1 series · 13 of 25 positions shown · non-contrast
Comparison: 10/16/2015

CLINICAL DATA: Goiter. Left thyroid nodule biopsy in 1411 and 0775.

EXAM:
THYROID ULTRASOUND
TECHNIQUE: Ultrasound examination of the thyroid gland and adjacent soft
tissues was performed.

[Series 1: us thyroid · 0.06mm/px · 13 of 49 slices shown]
[im 1/49]
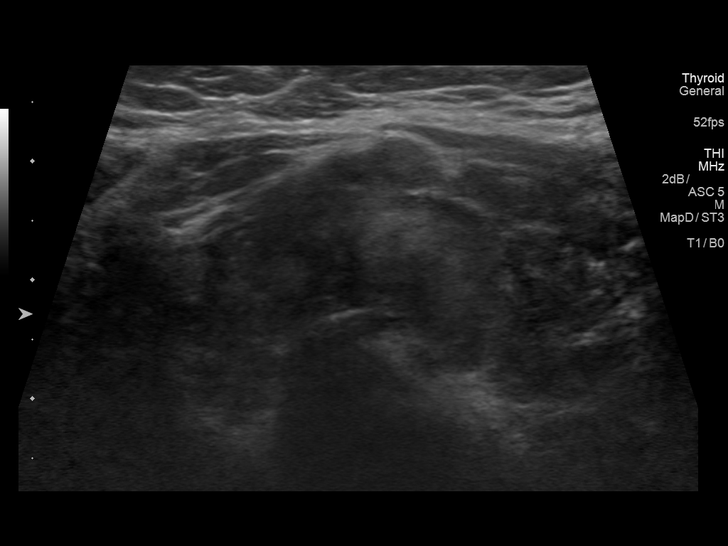
[im 5/49]
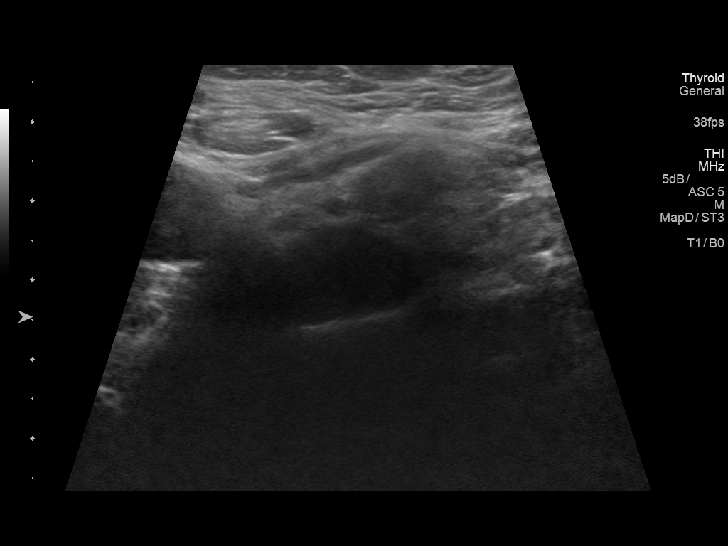
[im 9/49]
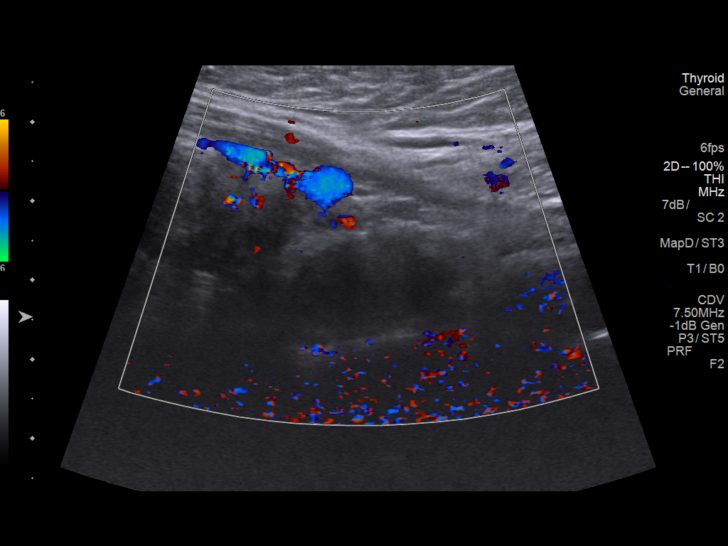
[im 13/49]
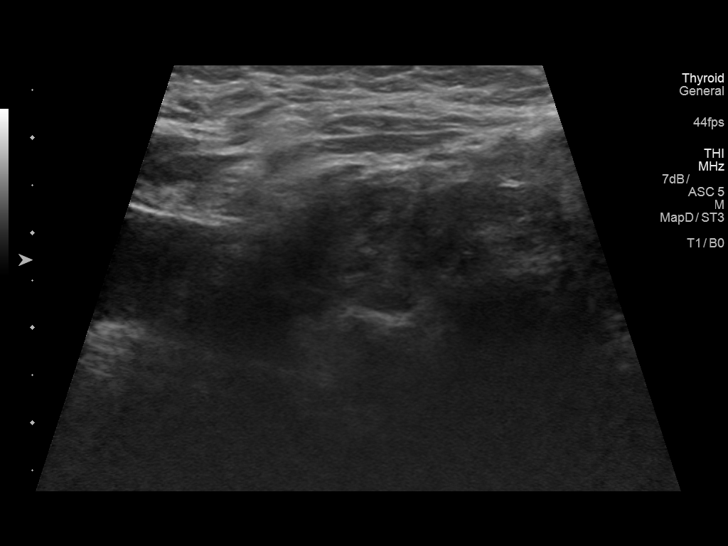
[im 17/49]
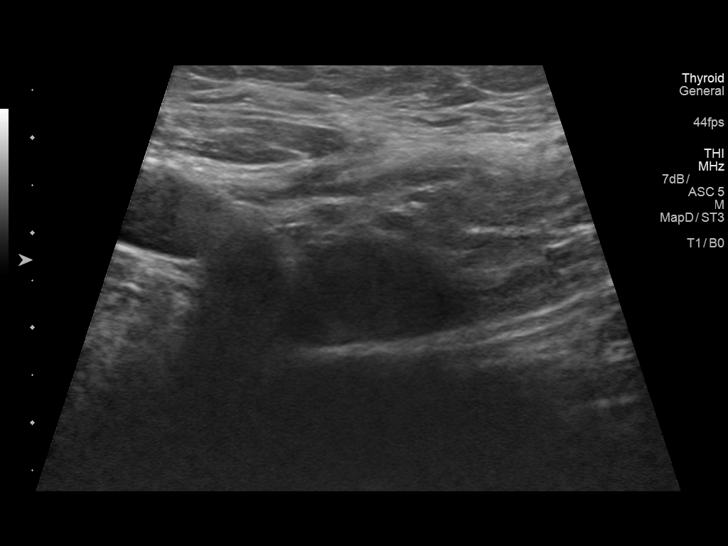
[im 21/49]
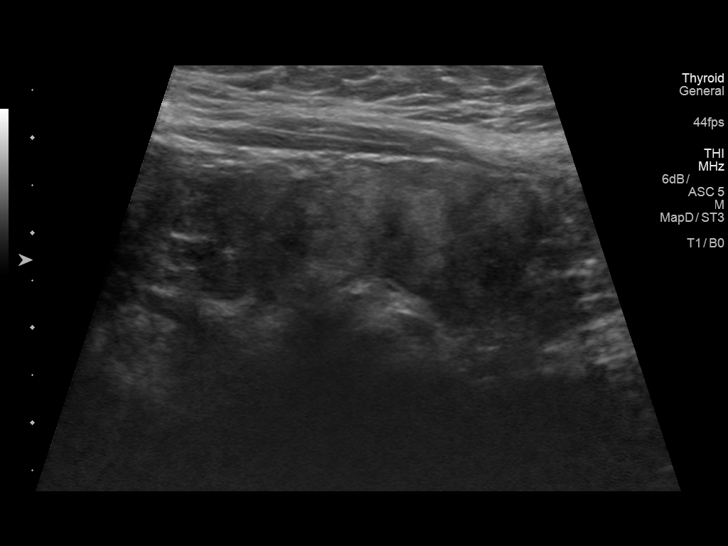
[im 25/49]
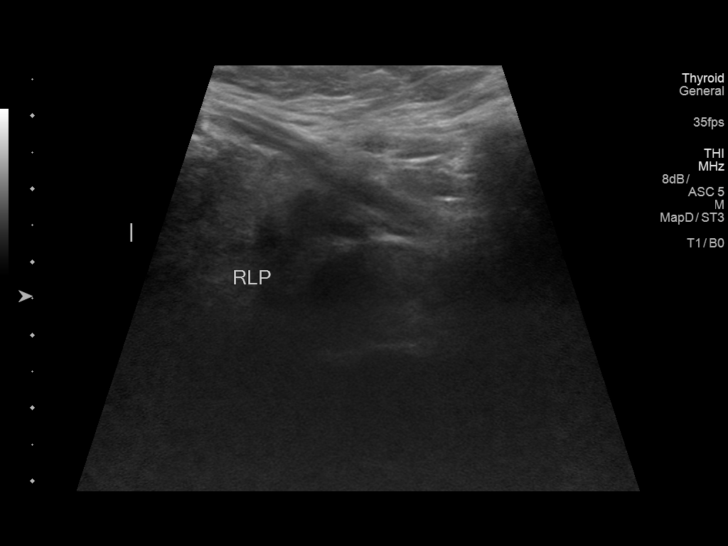
[im 29/49]
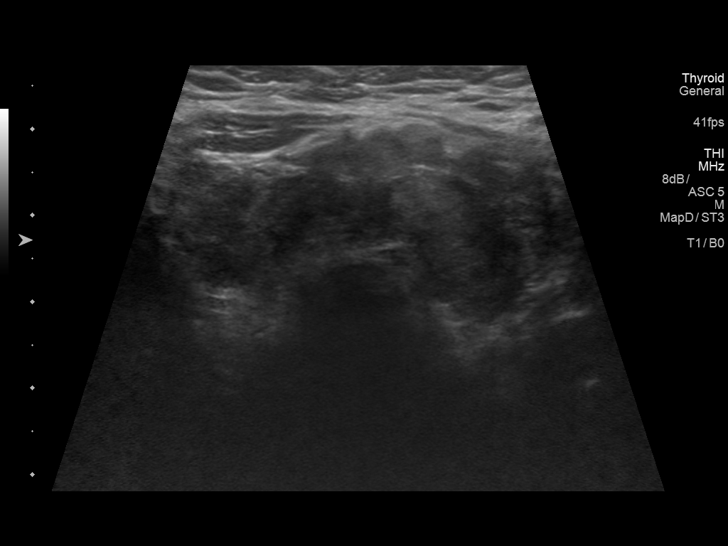
[im 33/49]
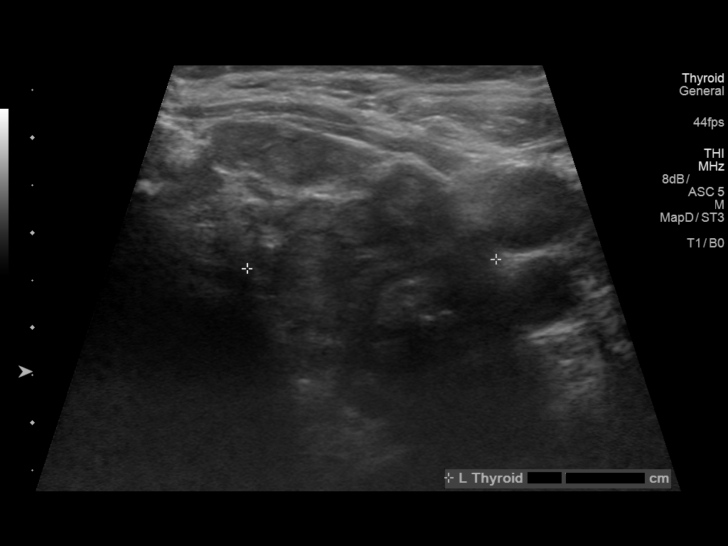
[im 37/49]
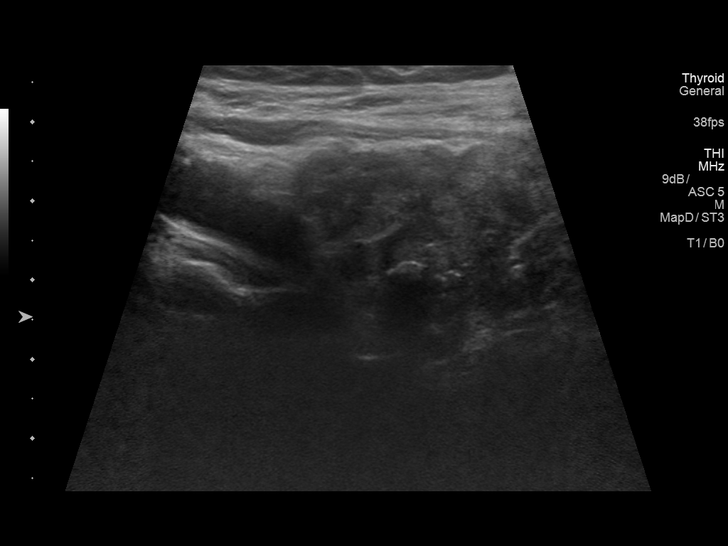
[im 41/49]
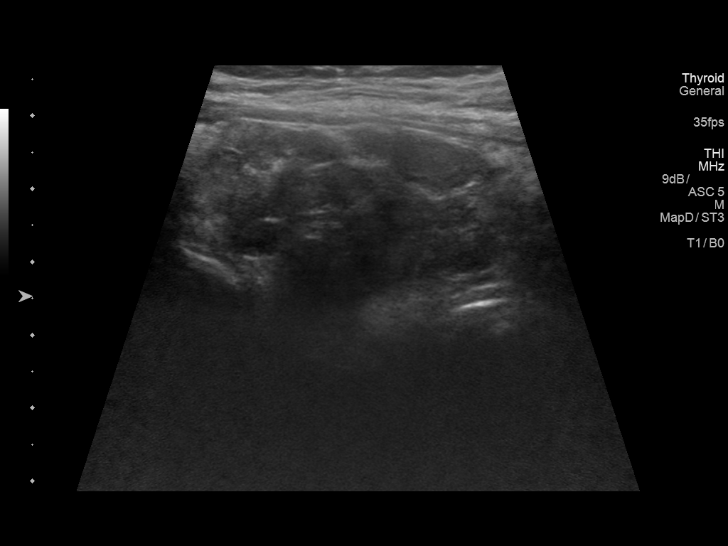
[im 45/49]
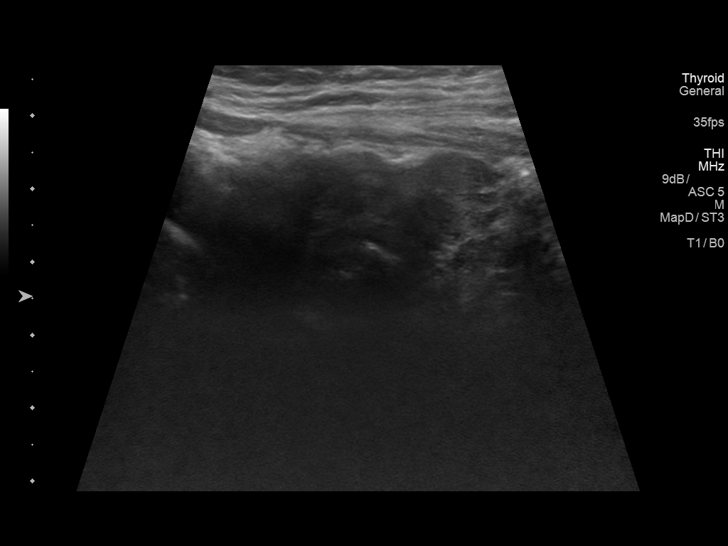
[im 49/49]
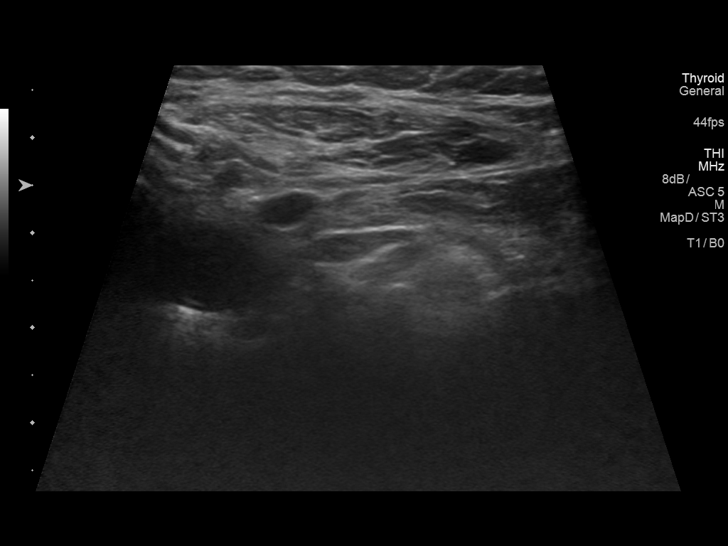

[13 of 25 positions shown; findings below may reference images not displayed]

FINDINGS: Parenchymal Echotexture: Markedly heterogenous

Isthmus: 1.6 cm, previously 1.2 cm

Right lobe: 4.1 x 1.7 x 1.5 cm previously 4.3 x 1.9 x 2.1 cm

Left lobe: 4.8 x 2.7 x 2.6 cm, previously 4.7 x 3.1 x 2.7 cm

_________________________________________________________

Estimated total number of nodules >/= 1 cm: 1

Number of spongiform nodules >/=  2 cm not described below (TR1): 0

Number of mixed cystic and solid nodules >/= 1.5 cm not described
below (TR2): 0

_________________________________________________________

Nodule # 1:

Prior biopsy: No

Location: Isthmus; Inferior

Maximum size: 2.1 cm; Other 2 dimensions: 1.3 x 2.0 cm, previously,
1.7 x 1.2 x 1.7 cm. This nodule measured 2.2 x 1.2 x 1.4 cm on
04/20/2013

Composition: solid/almost completely solid (2)

Echogenicity: very hypoechoic (3)

Shape: not taller-than-wide (0)

Margins: ill-defined (0)

Echogenic foci: none (0)

ACR TI-RADS total points: 5.

ACR TI-RADS risk category:  TR4 (4-6 points).

Significant change in size (>/= 20% in two dimensions and minimal
increase of 2 mm): Yes

Change in features: No

Change in ACR TI-RADS risk category: No

ACR TI-RADS recommendations:

**Given size (>/= 1.5 cm) and appearance, fine needle aspiration of
this moderately suspicious nodule should be considered based on
TI-RADS criteria.

This is presumed to be an exophytic isthmus nodule.

_________________________________________________________

Thyroid tissue is markedly heterogeneous. No discrete right thyroid
nodules. Again noted is a small cluster of calcified nodules in the
left thyroid lobe, largest 0.8 cm. These calcified left thyroid
nodules have not significantly changed.

No lymph node enlargement.
IMPRESSION: Thyroid tissue has not significantly changed in size or appearance.
The thyroid tissue remains markedly heterogeneous with some
nodularity.

Persistent solid nodule just inferior to the isthmus, presumed to
represent an exophytic thyroid nodule. There has been interval
enlargement since 8688 but this nodule has not significantly changed
when comparing to the study in 5216. This is a TR 4 nodule and could
consider ultrasound-guided fine-needle aspiration.

The above is in keeping with the ACR TI-RADS recommendations - [HOSPITAL] 8688;[DATE].

## 2019-11-04 ENCOUNTER — Other Ambulatory Visit: Payer: Medicare Other

## 2019-11-11 DIAGNOSIS — I872 Venous insufficiency (chronic) (peripheral): Secondary | ICD-10-CM | POA: Diagnosis not present

## 2019-11-11 DIAGNOSIS — L97812 Non-pressure chronic ulcer of other part of right lower leg with fat layer exposed: Secondary | ICD-10-CM | POA: Diagnosis not present

## 2019-11-11 DIAGNOSIS — I890 Lymphedema, not elsewhere classified: Secondary | ICD-10-CM | POA: Diagnosis not present

## 2019-11-11 DIAGNOSIS — I10 Essential (primary) hypertension: Secondary | ICD-10-CM | POA: Diagnosis not present

## 2019-11-11 DIAGNOSIS — I87311 Chronic venous hypertension (idiopathic) with ulcer of right lower extremity: Secondary | ICD-10-CM | POA: Diagnosis not present

## 2019-11-18 DIAGNOSIS — I87311 Chronic venous hypertension (idiopathic) with ulcer of right lower extremity: Secondary | ICD-10-CM | POA: Diagnosis not present

## 2019-11-18 DIAGNOSIS — I890 Lymphedema, not elsewhere classified: Secondary | ICD-10-CM | POA: Diagnosis not present

## 2019-11-18 DIAGNOSIS — L97812 Non-pressure chronic ulcer of other part of right lower leg with fat layer exposed: Secondary | ICD-10-CM | POA: Diagnosis not present

## 2019-11-18 DIAGNOSIS — Z6841 Body Mass Index (BMI) 40.0 and over, adult: Secondary | ICD-10-CM | POA: Diagnosis not present

## 2019-11-18 DIAGNOSIS — I10 Essential (primary) hypertension: Secondary | ICD-10-CM | POA: Diagnosis not present

## 2019-11-18 DIAGNOSIS — L89000 Pressure ulcer of unspecified elbow, unstageable: Secondary | ICD-10-CM | POA: Diagnosis not present

## 2019-11-25 DIAGNOSIS — I872 Venous insufficiency (chronic) (peripheral): Secondary | ICD-10-CM | POA: Diagnosis not present

## 2019-11-25 DIAGNOSIS — Z6841 Body Mass Index (BMI) 40.0 and over, adult: Secondary | ICD-10-CM | POA: Diagnosis not present

## 2019-11-25 DIAGNOSIS — I87311 Chronic venous hypertension (idiopathic) with ulcer of right lower extremity: Secondary | ICD-10-CM | POA: Diagnosis not present

## 2019-11-25 DIAGNOSIS — L97812 Non-pressure chronic ulcer of other part of right lower leg with fat layer exposed: Secondary | ICD-10-CM | POA: Diagnosis not present

## 2019-11-25 DIAGNOSIS — I10 Essential (primary) hypertension: Secondary | ICD-10-CM | POA: Diagnosis not present

## 2019-11-25 DIAGNOSIS — I890 Lymphedema, not elsewhere classified: Secondary | ICD-10-CM | POA: Diagnosis not present

## 2019-11-30 ENCOUNTER — Ambulatory Visit
Admission: RE | Admit: 2019-11-30 | Discharge: 2019-11-30 | Disposition: A | Discharge status: Home / Self Care | Payer: Medicare Other | Source: Ambulatory Visit | Attending: Endocrinology | Admitting: Endocrinology

## 2019-11-30 DIAGNOSIS — E049 Nontoxic goiter, unspecified: Secondary | ICD-10-CM

## 2019-11-30 DIAGNOSIS — E041 Nontoxic single thyroid nodule: Secondary | ICD-10-CM | POA: Diagnosis not present

## 2019-12-02 DIAGNOSIS — Z6841 Body Mass Index (BMI) 40.0 and over, adult: Secondary | ICD-10-CM | POA: Diagnosis not present

## 2019-12-02 DIAGNOSIS — I10 Essential (primary) hypertension: Secondary | ICD-10-CM | POA: Diagnosis not present

## 2019-12-02 DIAGNOSIS — I87311 Chronic venous hypertension (idiopathic) with ulcer of right lower extremity: Secondary | ICD-10-CM | POA: Diagnosis not present

## 2019-12-02 DIAGNOSIS — L97819 Non-pressure chronic ulcer of other part of right lower leg with unspecified severity: Secondary | ICD-10-CM | POA: Diagnosis not present

## 2019-12-02 DIAGNOSIS — L97812 Non-pressure chronic ulcer of other part of right lower leg with fat layer exposed: Secondary | ICD-10-CM | POA: Diagnosis not present

## 2019-12-02 DIAGNOSIS — I890 Lymphedema, not elsewhere classified: Secondary | ICD-10-CM | POA: Diagnosis not present

## 2019-12-08 DIAGNOSIS — M6281 Muscle weakness (generalized): Secondary | ICD-10-CM | POA: Diagnosis not present

## 2019-12-08 DIAGNOSIS — R2689 Other abnormalities of gait and mobility: Secondary | ICD-10-CM | POA: Diagnosis not present

## 2019-12-09 DIAGNOSIS — L97812 Non-pressure chronic ulcer of other part of right lower leg with fat layer exposed: Secondary | ICD-10-CM | POA: Diagnosis not present

## 2019-12-09 DIAGNOSIS — I890 Lymphedema, not elsewhere classified: Secondary | ICD-10-CM | POA: Diagnosis not present

## 2019-12-09 DIAGNOSIS — I10 Essential (primary) hypertension: Secondary | ICD-10-CM | POA: Diagnosis not present

## 2019-12-09 DIAGNOSIS — I872 Venous insufficiency (chronic) (peripheral): Secondary | ICD-10-CM | POA: Diagnosis not present

## 2019-12-09 DIAGNOSIS — Z6841 Body Mass Index (BMI) 40.0 and over, adult: Secondary | ICD-10-CM | POA: Diagnosis not present

## 2019-12-09 DIAGNOSIS — I87311 Chronic venous hypertension (idiopathic) with ulcer of right lower extremity: Secondary | ICD-10-CM | POA: Diagnosis not present

## 2019-12-16 DIAGNOSIS — I87311 Chronic venous hypertension (idiopathic) with ulcer of right lower extremity: Secondary | ICD-10-CM | POA: Diagnosis not present

## 2019-12-16 DIAGNOSIS — I872 Venous insufficiency (chronic) (peripheral): Secondary | ICD-10-CM | POA: Diagnosis not present

## 2019-12-16 DIAGNOSIS — L97812 Non-pressure chronic ulcer of other part of right lower leg with fat layer exposed: Secondary | ICD-10-CM | POA: Diagnosis not present

## 2019-12-16 DIAGNOSIS — Z6841 Body Mass Index (BMI) 40.0 and over, adult: Secondary | ICD-10-CM | POA: Diagnosis not present

## 2019-12-16 DIAGNOSIS — I10 Essential (primary) hypertension: Secondary | ICD-10-CM | POA: Diagnosis not present

## 2019-12-16 DIAGNOSIS — I890 Lymphedema, not elsewhere classified: Secondary | ICD-10-CM | POA: Diagnosis not present

## 2019-12-23 DIAGNOSIS — Z6841 Body Mass Index (BMI) 40.0 and over, adult: Secondary | ICD-10-CM | POA: Diagnosis not present

## 2019-12-23 DIAGNOSIS — R2689 Other abnormalities of gait and mobility: Secondary | ICD-10-CM | POA: Diagnosis not present

## 2019-12-23 DIAGNOSIS — L97812 Non-pressure chronic ulcer of other part of right lower leg with fat layer exposed: Secondary | ICD-10-CM | POA: Diagnosis not present

## 2019-12-23 DIAGNOSIS — I872 Venous insufficiency (chronic) (peripheral): Secondary | ICD-10-CM | POA: Diagnosis not present

## 2019-12-23 DIAGNOSIS — I87311 Chronic venous hypertension (idiopathic) with ulcer of right lower extremity: Secondary | ICD-10-CM | POA: Diagnosis not present

## 2019-12-23 DIAGNOSIS — M6281 Muscle weakness (generalized): Secondary | ICD-10-CM | POA: Diagnosis not present

## 2019-12-23 DIAGNOSIS — I890 Lymphedema, not elsewhere classified: Secondary | ICD-10-CM | POA: Diagnosis not present

## 2019-12-30 DIAGNOSIS — I87311 Chronic venous hypertension (idiopathic) with ulcer of right lower extremity: Secondary | ICD-10-CM | POA: Diagnosis not present

## 2019-12-30 DIAGNOSIS — I872 Venous insufficiency (chronic) (peripheral): Secondary | ICD-10-CM | POA: Diagnosis not present

## 2019-12-30 DIAGNOSIS — I890 Lymphedema, not elsewhere classified: Secondary | ICD-10-CM | POA: Diagnosis not present

## 2019-12-30 DIAGNOSIS — L97812 Non-pressure chronic ulcer of other part of right lower leg with fat layer exposed: Secondary | ICD-10-CM | POA: Diagnosis not present

## 2019-12-30 DIAGNOSIS — Z6841 Body Mass Index (BMI) 40.0 and over, adult: Secondary | ICD-10-CM | POA: Diagnosis not present

## 2020-01-05 DIAGNOSIS — R933 Abnormal findings on diagnostic imaging of other parts of digestive tract: Secondary | ICD-10-CM | POA: Diagnosis not present

## 2020-01-05 DIAGNOSIS — R918 Other nonspecific abnormal finding of lung field: Secondary | ICD-10-CM | POA: Diagnosis not present

## 2020-01-05 DIAGNOSIS — M3390 Dermatopolymyositis, unspecified, organ involvement unspecified: Secondary | ICD-10-CM | POA: Diagnosis not present

## 2020-01-05 DIAGNOSIS — R531 Weakness: Secondary | ICD-10-CM | POA: Diagnosis not present

## 2020-01-07 DIAGNOSIS — I87311 Chronic venous hypertension (idiopathic) with ulcer of right lower extremity: Secondary | ICD-10-CM | POA: Diagnosis not present

## 2020-01-07 DIAGNOSIS — I10 Essential (primary) hypertension: Secondary | ICD-10-CM | POA: Diagnosis not present

## 2020-01-07 DIAGNOSIS — I890 Lymphedema, not elsewhere classified: Secondary | ICD-10-CM | POA: Diagnosis not present

## 2020-01-07 DIAGNOSIS — I872 Venous insufficiency (chronic) (peripheral): Secondary | ICD-10-CM | POA: Diagnosis not present

## 2020-01-07 DIAGNOSIS — L97812 Non-pressure chronic ulcer of other part of right lower leg with fat layer exposed: Secondary | ICD-10-CM | POA: Diagnosis not present

## 2020-01-07 DIAGNOSIS — Z6841 Body Mass Index (BMI) 40.0 and over, adult: Secondary | ICD-10-CM | POA: Diagnosis not present

## 2020-01-13 DIAGNOSIS — I10 Essential (primary) hypertension: Secondary | ICD-10-CM | POA: Diagnosis not present

## 2020-01-13 DIAGNOSIS — I890 Lymphedema, not elsewhere classified: Secondary | ICD-10-CM | POA: Diagnosis not present

## 2020-01-13 DIAGNOSIS — I872 Venous insufficiency (chronic) (peripheral): Secondary | ICD-10-CM | POA: Diagnosis not present

## 2020-01-13 DIAGNOSIS — L97812 Non-pressure chronic ulcer of other part of right lower leg with fat layer exposed: Secondary | ICD-10-CM | POA: Diagnosis not present

## 2020-01-13 DIAGNOSIS — I87311 Chronic venous hypertension (idiopathic) with ulcer of right lower extremity: Secondary | ICD-10-CM | POA: Diagnosis not present

## 2020-02-07 ENCOUNTER — Encounter (HOSPITAL_BASED_OUTPATIENT_CLINIC_OR_DEPARTMENT_OTHER): Payer: Medicare Other | Attending: Internal Medicine | Admitting: Internal Medicine

## 2020-02-07 DIAGNOSIS — E039 Hypothyroidism, unspecified: Secondary | ICD-10-CM | POA: Insufficient documentation

## 2020-02-07 DIAGNOSIS — M3313 Other dermatomyositis without myopathy: Secondary | ICD-10-CM | POA: Diagnosis not present

## 2020-02-07 DIAGNOSIS — L97818 Non-pressure chronic ulcer of other part of right lower leg with other specified severity: Secondary | ICD-10-CM | POA: Diagnosis not present

## 2020-02-07 DIAGNOSIS — G4733 Obstructive sleep apnea (adult) (pediatric): Secondary | ICD-10-CM | POA: Diagnosis not present

## 2020-02-07 DIAGNOSIS — I890 Lymphedema, not elsewhere classified: Secondary | ICD-10-CM | POA: Diagnosis not present

## 2020-02-07 DIAGNOSIS — Z96612 Presence of left artificial shoulder joint: Secondary | ICD-10-CM | POA: Diagnosis not present

## 2020-02-07 DIAGNOSIS — I10 Essential (primary) hypertension: Secondary | ICD-10-CM | POA: Diagnosis not present

## 2020-02-08 NOTE — Progress Notes (Addendum)
VICIE, [facility] ([id number]) Visit Report for [name] Chief Complaint Document Details Patient Name: Date of Service: Janice [name], Janice [name] [name] 1:15 PM Medical Record Number: [id number] Patient Account Number: [id number] Date of Birth/Sex: Treating RN: [birth year] (79 y.o. Janice [name] Primary Care Provider: Carol Ada Other Clinician: Referring Provider: Treating Provider/Extender: Stefanie Libel, Lenor Derrick in Treatment: 0 Information Obtained from: Patient Chief Complaint [name]; this patient is here for review of a wound on her right anterior lower extremity in the setting of 3+ lymphedema Electronic Signature(s) Signed: 02/08/2020 8:12:39 AM By: Linton Ham MD Entered By: Linton Ham on 02/07/2020 15:23:33 -------------------------------------------------------------------------------- Debridement Details Patient Name: Date of Service: Janice [name] 1:15 PM Medical Record Number: [id number] Patient Account Number: [id number] Date of Birth/Sex: Treating RN: June 26, 1941 (79 y.o. Janice [name] Primary Care Provider: Carol Ada Other Clinician: Referring Provider: Treating Provider/Extender: Stefanie Libel, Lenor Derrick in Treatment: 0 Debridement Performed for Assessment: Wound #1 Right,Anterior Lower Leg Performed By: Clinician Levan Hurst, RN Debridement Type: Chemical/Enzymatic/Mechanical Agent Used: wound cleanser and gauze Severity of Tissue Pre Debridement: Fat layer exposed Level of Consciousness (Pre-procedure): Awake and Alert Pre-procedure Verification/Time Out No Taken: Start Time: 15:05 Bleeding: None End Time: 15:05 Procedural Pain: 0 Post Procedural Pain: 0 Response to Treatment: Procedure was tolerated well Level of Consciousness (Post- Awake and Alert procedure): Post Debridement Measurements of Total Wound Length: (cm) 1.8 Width: (cm) 3 Depth: (cm) 0.1 Volume: (cm) 0.424 Character of  Wound/Ulcer Post Debridement: Improved Severity of Tissue Post Debridement: Fat layer exposed Post Procedure Diagnosis Same as Pre-procedure Notes biofilm removed Electronic Signature(s) Signed: 02/07/2020 5:04:17 PM By: Levan Hurst RN, BSN Signed: 02/08/2020 8:12:39 AM By: Linton Ham MD Entered By: Linton Ham on 02/07/2020 15:22:36 -------------------------------------------------------------------------------- HPI Details Patient Name: Date of Service: Janice [name] 1:15 PM Medical Record Number: [id number] Patient Account Number: [id number] Date of Birth/Sex: Treating RN: 1941-06-22 (79 y.o. Janice [name] Primary Care Provider: Carol Ada Other Clinician: Referring Provider: Treating Provider/Extender: Stefanie Libel, Lenor Derrick in Treatment: 0 History of Present Illness HPI Description: ADMISSION [name] This is an 79 year old woman who is transferring from our sister clinic in [place] after their closure. She has been treated by Dr. Jerilee Hoh in the clinic there since the fall 2018. She was healed in August 2019 but readmitted to the clinic in January 2021. She has compression pumps that were apparently arranged for her by Dr. Sharol Given in the past she uses them once a day although she has not been able to do this recently subsequent to a death of a close friend. 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 Her particular condition is extremely painful to the touch therefore she cannot tolerate stockings or compression wraps although she has not had 1 of these recently. They have been using calcium alginate on the wound. Recently a culture was done and she completed a course of ciprofloxacin. She thinks the wound has been gradually contracting. 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 She appears to be walking with a walker. What drives with hand controllers. She has a custom shoe with an AFO brace on the right leg Electronic Signature(s) Signed: 02/08/2020 8:12:39  AM By: Linton Ham MD Entered By: Linton Ham on 02/07/2020 15:31:28 -------------------------------------------------------------------------------- Physical Exam Details Patient Name: Date of Service: Janice [name] 1:15 PM Medical Record Number: [id number] Patient Account Number: [id number] Date of Birth/Sex: Treating RN: 1941-06-16 (79 y.o. Janice [name] Primary Care Provider: Carol Ada Other Clinician: Referring Provider: Treating Provider/Extender: Stefanie Libel, Lenor Derrick in Treatment: 0 Constitutional Patient is hypertensive.. Pulse regular and within target range for patient.Marland Kitchen Respirations regular, non-labored and within target range.. Temperature is normal and within the target range for the patient.Marland Kitchen Appears in no distress. Respiratory work of breathing is normal. Cardiovascular Could not examine as the patient would not take off her shoe in her brace on the right foot. Massive nonpitting edema in the right leg below the knee greater than the left.. Integumentary (Hair, Skin) Area of stasis dermatitis around the wound no evidence of infection. Psychiatric appears at normal baseline. Notes Wound exam; the area questions on the right anterior tibia just above the ankle area. This is in the setting of severe right-sided lymphedema and some surrounding stasis inflammation. This is not tender. I did not debride this today although it certainly needs debridement nonviable surface. Electronic Signature(s) Signed: 02/08/2020 8:12:39 AM By: Linton Ham MD Entered By: Linton Ham on 02/07/2020 15:31:44 -------------------------------------------------------------------------------- Physician Orders Details Patient Name: Date of Service: Janice [name] 1:15 PM Medical Record Number: [id number] Patient Account Number: [id number] Date of Birth/Sex: Treating RN: [birth year] (79 y.o. Janice [name] Primary Care Provider: Carol Ada Other Clinician: Referring Provider: Treating Provider/Extender: Lona Millard in Treatment: 0 Verbal / Phone Orders: No Diagnosis Coding Follow-up Appointments Return Appointment in 1 week. Dressing Change Frequency Wound #1 Right,Anterior Lower Leg Change Dressing every other day. Wound Cleansing Wound #1 Right,Anterior Lower Leg May shower and wash wound with soap and water. - on days that dressing is changed Primary Wound Dressing Wound #1 Right,Anterior Lower Leg Iodoflex Secondary Dressing Wound #1 Right,Anterior Lower Leg Other: - ABD pad and paper tape Edema Control Avoid standing for long periods of time Elevate legs to the level of the heart or above for 30 minutes daily and/or when sitting, a frequency of: - throughout the day Exercise regularly Segmental Compressive Device. - Lymphedema pumps twice a day for 1 hour each time Electronic Signature(s) Signed: 02/07/2020 5:04:17 PM By: Levan Hurst RN, BSN Signed: 02/08/2020 8:12:39 AM By: Linton Ham MD Entered By: Levan Hurst on 02/07/2020 15:15:09 -------------------------------------------------------------------------------- Problem List Details Patient Name: Date of Service: Janice [name] 1:15 PM Medical Record Number: [id number] Patient Account Number: [id number] Date of Birth/Sex: Treating RN: 11/09/1940 (79 y.o. 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 Janice [name] Primary Care Provider: Carol Ada Other Clinician: Referring Provider: Treating Provider/Extender: Stefanie Libel, Candace Weeks in Treatment: 0  Subjective Chief Complaint Information obtained from Patient [name]; this patient is here for review of a wound on her right anterior lower extremity in the setting of 3+ lymphedema History of Present Illness (HPI) ADMISSION [name] This is an 79 year old woman who is transferring from our sister clinic in [place] after their closure. She has been treated by Dr. Jerilee Hoh in the clinic there since the fall 2018. She was healed in August 2019 but readmitted to the clinic in January 2021. She has compression pumps that were apparently arranged for her by Dr. Sharol Given in the past she uses them once a day although she has not been able to do this recently subsequent to a death of a close friend. 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 Her particular condition is extremely painful to  the touch therefore she cannot tolerate stockings or compression wraps although she has not had 1 of these recently. They have been using calcium alginate on the wound. Recently a culture was done and she completed a course of ciprofloxacin. She thinks the wound has been gradually contracting. 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 She appears to be walking with a walker. What drives with hand controllers. She has a custom shoe with an AFO brace on the right leg Patient History Information obtained from Patient. Allergies gabapentin, prednisone, amlodipine besylate, atorvastatin, baclofen, colesevelam, furosemide, irbesartan, Myrbetriq, ranitidine, sulfamethoxazole, tizanidine, tramadol, WelChol Family History Cancer - Siblings,Father,Mother, Diabetes, Hypertension - Mother, Thyroid Problems - Mother,Siblings, No family history of Heart Disease, Hereditary Spherocytosis, Kidney Disease, Lung Disease, Seizures, Stroke, Tuberculosis. Social History Never smoker, Marital Status - Single, Alcohol Use - Never, Drug Use - No History, Caffeine Use - Rarely. Medical History Eyes Denies history of Cataracts, Glaucoma, Optic Neuritis Ear/Nose/Mouth/Throat Denies history of Chronic sinus problems/congestion, Middle ear problems Hematologic/Lymphatic Denies history of Anemia, Hemophilia, Human Immunodeficiency Virus, Lymphedema, Sickle Cell Disease Respiratory Patient has history of Sleep Apnea Denies history of Aspiration, Asthma, Chronic Obstructive Pulmonary Disease (COPD), Pneumothorax, Tuberculosis Cardiovascular Denies history of Angina, Arrhythmia, Congestive Heart Failure, Coronary Artery Disease, Deep Vein Thrombosis, Hypertension, Hypotension,  Myocardial Infarction, Peripheral Arterial Disease, Peripheral Venous Disease, Phlebitis, Vasculitis Gastrointestinal Denies history of Cirrhosis , Colitis, Crohnoos, Hepatitis A, Hepatitis B, Hepatitis C Endocrine Denies history of Type I Diabetes, Type II Diabetes Genitourinary Denies history of End Stage Renal Disease Immunological Denies history of Lupus Erythematosus, Raynaudoos, Scleroderma Integumentary (Skin) Denies history of History of Burn Musculoskeletal Denies history of Gout, Rheumatoid Arthritis, Osteoarthritis, Osteomyelitis Neurologic Denies history of Dementia, Neuropathy, Quadriplegia, Paraplegia, Seizure Disorder Oncologic Denies history of Received Chemotherapy, Received Radiation Psychiatric Denies history of Anorexia/bulimia, Confinement Anxiety Review of Systems (ROS) Constitutional Symptoms (General Health) Denies complaints or symptoms of Fatigue, Fever, Chills, Marked Weight Change. Eyes Complains or has symptoms of Glasses / Contacts. Denies complaints or symptoms of Dry Eyes, Vision Changes. Ear/Nose/Mouth/Throat Denies complaints or symptoms of Chronic sinus problems or rhinitis. Respiratory Denies complaints or symptoms of Chronic or frequent coughs, Shortness of Breath. Cardiovascular Denies complaints or symptoms of Chest pain. Gastrointestinal Denies complaints or symptoms of Frequent diarrhea, Nausea, Vomiting. Endocrine Denies complaints or symptoms of Heat/cold intolerance. Genitourinary Denies complaints or symptoms of Frequent urination. Integumentary (Skin) Complains or  has symptoms of Wounds. Musculoskeletal Complains or has symptoms of Muscle Pain, Muscle Weakness. Neurologic Denies complaints or symptoms of Numbness/parasthesias. Psychiatric Denies complaints or symptoms of Claustrophobia, Suicidal. Objective Constitutional Patient is hypertensive.. Pulse regular and within target range for patient.Marland Kitchen Respirations regular,  non-labored and within target range.. Temperature is normal and within the target range for the patient.Marland Kitchen Appears in no distress. Vitals Time Taken: 2:19 PM, Height: 64 in, Source: Stated, Weight: 262 lbs, Source: Stated, BMI: 45, Temperature: 97.7 F, Pulse: 90 bpm, Respiratory Rate: 18 breaths/min, Blood Pressure: 159/86 mmHg. Respiratory work of breathing is normal. Cardiovascular Could not examine as the patient would not take off her shoe in her brace on the right foot. Massive nonpitting edema in the right leg below the knee greater than the left.Marland Kitchen Psychiatric appears at normal baseline. General Notes: Wound exam; the area questions on the right anterior tibia just above the ankle area. This is in the setting of severe right-sided lymphedema and some surrounding stasis inflammation. This is not tender. I did not debride this today although it certainly needs debridement nonviable surface. Integumentary (Hair, Skin) Area of stasis dermatitis around the wound no evidence of infection. Wound #1 status is Open. Original cause of wound was Gradually Appeared. The wound is located on the Right,Anterior Lower Leg. The wound measures 1.8cm length x 3cm width x 0.1cm depth; 4.241cm^2 area and 0.424cm^3 volume. There is Fat Layer (Subcutaneous Tissue) Exposed exposed. There is no tunneling or undermining noted. There is a medium amount of serosanguineous drainage noted. The wound margin is flat and intact. There is small (1-33%) red granulation within the wound bed. There is a large (67-100%) amount of necrotic tissue within the wound bed including Adherent Slough. 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 There was a Chemical/Enzymatic/Mechanical debridement performed by Levan Hurst, RN.. Other agent used was wound cleanser and gauze. There was no bleeding. The procedure was tolerated well with a pain level of 0 throughout and a pain level of 0 following the procedure. Post Debridement Measurements: 1.8cm length x 3cm width x 0.1cm depth; 0.424cm^3 volume. Character of Wound/Ulcer Post Debridement is improved. Severity of Tissue Post Debridement is: Fat layer exposed. Post procedure Diagnosis Wound #1: Same as Pre-Procedure General Notes: biofilm removed. Plan Follow-up Appointments: Return Appointment in 1 week. Dressing Change Frequency: Wound #1 Right,Anterior Lower Leg: Change Dressing every other day. Wound Cleansing: Wound #1 Right,Anterior Lower Leg: May shower and wash wound with soap and water. - on days that dressing is changed Primary Wound Dressing: Wound #1 Right,Anterior Lower Leg: Iodoflex Secondary Dressing: Wound #1 Right,Anterior Lower Leg: Other: - ABD pad and paper tape Edema Control: Avoid standing for long periods of time Elevate legs to the level of the heart or above for 30 minutes daily and/or when sitting, a frequency of: - throughout the day Exercise regularly Segmental Compressive Device. - Lymphedema pumps twice a day for 1 hour each time 1. I change the primary dressing to Iodoflex to help with ongoing debridement. 2. I did not do any mechanical debridement on this wound today however that may be necessary in the future 3. I have asked her to increase her compression pump usage to twice a day.  She said she would 4. She is not tolerant of either compression stockings or apparently external compression wraps due to pain. This is indeed unfortunate secondary to the severe edema in the right leg 5. I also asked her to keep her legs elevated, compression pump usage to twice a day. Currently the amount of edema in  her light leg would not be compatible with easy healing of this wound Electronic Signature(s) Signed: 02/08/2020 8:12:39 AM By: Linton Ham MD Signed: 02/08/2020 8:12:39 AM By: Linton Ham MD Entered By: Linton Ham on 02/07/2020 15:33:26 -------------------------------------------------------------------------------- HxROS Details Patient Name: Date of Service: Janice [name] 1:15 PM Medical Record Number: [id number] Patient Account Number: [id number] Date of Birth/Sex: Treating RN: 07/14/1941 (79 y.o. Janice [name] Primary Care Provider: Carol Ada Other Clinician: Referring Provider: Treating Provider/Extender: Lona Millard in Treatment: 0 Information Obtained From Patient Constitutional Symptoms (General Health) Complaints and Symptoms: Negative for: Fatigue; Fever; Chills; Marked Weight Change Eyes Complaints and Symptoms: Positive for: Glasses / Contacts Negative for: Dry Eyes; Vision Changes Medical History: Negative for: Cataracts; Glaucoma; Optic Neuritis Ear/Nose/Mouth/Throat Complaints and Symptoms: Negative for: Chronic sinus problems or rhinitis Medical History: Negative for: Chronic sinus problems/congestion; Middle ear problems Respiratory Complaints and Symptoms: Negative for: Chronic or frequent coughs; Shortness of Breath Medical History: Positive for: Sleep Apnea Negative for: Aspiration; Asthma; Chronic Obstructive Pulmonary Disease (COPD); Pneumothorax; Tuberculosis Cardiovascular Complaints and Symptoms: Negative for: Chest pain Medical History: Negative for: Angina; Arrhythmia; Congestive Heart Failure; Coronary Artery Disease; Deep Vein Thrombosis; Hypertension; Hypotension; Myocardial Infarction; Peripheral Arterial Disease; Peripheral Venous Disease; Phlebitis; Vasculitis Gastrointestinal Complaints and Symptoms: Negative for: Frequent diarrhea; Nausea; Vomiting Medical History: Negative  for: Cirrhosis ; Colitis; Crohns; Hepatitis A; Hepatitis B; Hepatitis C Endocrine Complaints and Symptoms: Negative for: Heat/cold intolerance Medical History: Negative for: Type I Diabetes; Type II Diabetes Genitourinary Complaints and Symptoms: Negative for: Frequent urination Medical History: Negative for: End Stage Renal Disease Integumentary (Skin) Complaints and Symptoms: Positive for: Wounds Medical History: Negative for: History of Burn Musculoskeletal Complaints and Symptoms: Positive for: Muscle Pain; Muscle Weakness Medical History: Negative for: Gout; Rheumatoid Arthritis; Osteoarthritis; Osteomyelitis Neurologic Complaints and Symptoms: Negative for: Numbness/parasthesias Medical History: Negative for: Dementia; Neuropathy; Quadriplegia; Paraplegia; Seizure Disorder Psychiatric Complaints and Symptoms: Negative for: Claustrophobia; Suicidal Medical History: Negative for: Anorexia/bulimia; Confinement Anxiety Hematologic/Lymphatic Medical History: Negative for: Anemia; Hemophilia; Human Immunodeficiency Virus; Lymphedema; Sickle Cell Disease Immunological Medical History: Negative for: Lupus Erythematosus; Raynauds; Scleroderma Oncologic Medical History: Negative for: Received Chemotherapy; Received Radiation Immunizations Pneumococcal Vaccine: Received Pneumococcal Vaccination: No Implantable Devices None Family and Social History Cancer: Yes - Siblings,Father,Mother; Diabetes: Yes; Heart Disease: No; Hereditary Spherocytosis: No; Hypertension: Yes - Mother; Kidney Disease: No; Lung Disease: No; Seizures: No; Stroke: No; Thyroid Problems: Yes - Mother,Siblings; Tuberculosis: No; Never smoker; Marital Status - Single; Alcohol Use: Never; Drug Use: No History; Caffeine Use: Rarely; Financial Concerns: No; Food, Clothing or Shelter Needs: No; Support System Lacking: No; Transportation Concerns: No Electronic Signature(s) Signed: 02/07/2020 5:13:16 PM By:  Carlene Coria RN Signed: 02/08/2020 8:12:39 AM By: Linton Ham MD Entered By: Carlene Coria on 02/07/2020 14:27:56 -------------------------------------------------------------------------------- SuperBill Details Patient Name: Date of Service: Janice [name] Medical Record Number: [id number] Patient Account Number: [id number] Date of Birth/Sex: Treating RN: 02-Sep-1940 (79 y.o. 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 Facility Procedures CPT4 Code: 07867544 Description: 92010 - WOUND CARE VISIT-LEV 3 EST PT Modifier: 25 Quantity: 1 CPT4 Code: 07121975 Description: 88325 - DEBRIDE W/O ANES NON SELECT Modifier: Quantity: 1 Physician Procedures : CPT4 Code Description Modifier [id number] [place] PHYS LEVEL 3 NEW PT ICD-10 Diagnosis Description I89.0 Lymphedema, not elsewhere classified L97.818 Non-pressure chronic ulcer of other part of right lower leg with other specified severity M33.12 Other  dermatomyositis with myopathy Quantity: 1 Electronic Signature(s) Signed: 02/07/2020 5:04:17 PM By: Levan Hurst RN, BSN Signed: 02/08/2020 8:12:39 AM By: Linton Ham MD Entered By: Levan Hurst on 02/07/2020 16:56:35

## 2020-02-08 NOTE — Progress Notes (Addendum)
Janice [name] ([id number]) Visit Report for 02/07/2020 Abuse/Suicide Risk Screen Details Patient Name: Date of Service: Janice [name] [name] 02/07/2020 1:15 PM Medical Record Number: [id number] Patient Account Number: [id number] Date of Birth/Sex: Treating RN: Jan 24, 1941 (79 y.o. Orvan Falconer Primary Care Jadarion Halbig: Carol Ada Other Clinician: Referring Rossy Virag: Treating Evanna Washinton/Extender: Stefanie Libel, Candace Weeks in Treatment: 0 Abuse/Suicide Risk Screen Items Answer ABUSE RISK SCREEN: Has anyone close to you tried to hurt or harm you recentlyo No Do you feel uncomfortable with anyone in your familyo No Has anyone forced you do things that you didnt want to doo No Electronic Signature(s) Signed: 02/07/2020 5:13:16 PM By: Carlene Coria RN Entered By: Carlene Coria on 02/07/2020 14:28:16 -------------------------------------------------------------------------------- Activities of Daily Living Details Patient Name: Date of Service: Janice [name] [name] 02/07/2020 1:15 PM Medical Record Number: [id number] Patient Account Number: [id number] Date of Birth/Sex: Treating RN: 01/07/1941 (79 y.o. Orvan Falconer Primary Care Jaylene Arrowood: Carol Ada Other Clinician: Referring Nelida Mandarino: Treating Arnez Stoneking/Extender: Stefanie Libel, Lenor Derrick in Treatment: 0 Activities of Daily Living Items Answer Activities of Daily Living (Please select one for each item) Drive Automobile Completely Able T Medications ake Completely Able Use T elephone Completely Able Care for Appearance Completely Able Use T oilet Completely Able Bath / Shower Completely Able Dress Self Completely Able Feed Self Completely Able Walk Completely Able Get In / Out Bed Completely Able Housework Need Assistance Prepare Meals Completely Refugio for Self Completely Able Electronic Signature(s) Signed: 02/07/2020 5:13:16 PM By: Carlene Coria RN Entered By: Carlene Coria on 02/07/2020 14:29:13 -------------------------------------------------------------------------------- Education Screening Details Patient Name: Date of Service: Janice [name] [name] 02/07/2020 1:15 PM Medical Record Number: [id number] Patient Account Number: [id number] Date of Birth/Sex: Treating RN: [birth year] (78 y.o. Orvan Falconer Primary Care Briannia Laba: Carol Ada Other Clinician: Referring Adena Sima: Treating Ivaan Liddy/Extender: Lona Millard in Treatment: 0 Primary Learner Assessed: Patient Learning Preferences/Education Level/Primary Language Learning Preference: Explanation Highest Education Level: College or Above Preferred Language: English Cognitive Barrier Language Barrier: No Translator Needed: No Memory Deficit: No Emotional Barrier: No Cultural/Religious Beliefs Affecting Medical Care: No Physical Barrier Impaired Vision: Yes Glasses Impaired Hearing: No Decreased Hand dexterity: No Knowledge/Comprehension Knowledge Level: High Comprehension Level: High Ability to understand written instructions: High Ability to understand verbal instructions: High Motivation Anxiety Level: Calm Cooperation: Cooperative Education Importance: Acknowledges Need Interest in Health Problems: Asks Questions Perception: Coherent Willingness to Engage in Self-Management High Activities: Readiness to Engage in Self-Management High Activities: Electronic Signature(s) Signed: 02/07/2020 5:13:16 PM By: Carlene Coria RN Entered By: Carlene Coria on 02/07/2020 14:29:42 -------------------------------------------------------------------------------- Fall Risk Assessment Details Patient Name: Date of Service: Janice [name] [name] 02/07/2020 1:15 PM Medical Record Number: [id number] Patient Account Number: [id number] Date of Birth/Sex: Treating RN: 10/24/1940 (79 y.o. Orvan Falconer Primary Care Jonetta Dagley: Carol Ada Other Clinician: Referring  Fares Ramthun: Treating Glenys Snader/Extender: Stefanie Libel, Lenor Derrick in Treatment: 0 Fall Risk Assessment Items Have you had 2 or more falls in the last 12 monthso 0 No Have you had any fall that resulted in injury in the last 12 monthso 0 No FALLS RISK SCREEN History of falling - immediate or within 3 months 0 No Secondary diagnosis (Do you have 2 or more medical diagnoseso) 0 No Ambulatory aid None/bed rest/wheelchair/nurse 0 No Crutches/cane/walker 0 No Furniture 0 No Intravenous therapy Access/Saline/Heparin Lock 0 No Gait/Transferring Normal/ bed rest/ wheelchair 0 No Weak (short steps with or without shuffle, stooped  but able to lift head while walking, may seek 0 No support from furniture) Impaired (short steps with shuffle, may have difficulty arising from chair, head down, impaired 0 No balance) Mental Status Oriented to own ability 0 No Electronic Signature(s) Signed: 02/07/2020 5:13:16 PM By: Carlene Coria RN Entered By: Carlene Coria on 02/07/2020 14:29:50 -------------------------------------------------------------------------------- Foot Assessment Details Patient Name: Date of Service: Janice [name] [name] 02/07/2020 1:15 PM Medical Record Number: [id number] Patient Account Number: [id number] Date of Birth/Sex: Treating RN: 10-Sep-1940 (79 y.o. Orvan Falconer Primary Care Jacky Hartung: Carol Ada Other Clinician: Referring Dannie Woolen: Treating Quintell Bonnin/Extender: Stefanie Libel, Candace Weeks in Treatment: 0 Foot Assessment Items Site Locations + = Sensation present, - = Sensation absent, C = Callus, U = Ulcer R = Redness, W = Warmth, M = Maceration, PU = Pre-ulcerative lesion F = Fissure, S = Swelling, D = Dryness Assessment Right: Left: Other Deformity: No No Prior Foot Ulcer: No No Prior Amputation: No No Charcot Joint: No No Ambulatory Status: Gait: Notes pateint declined Electronic Signature(s) Signed: 02/07/2020 5:13:16 PM By: Carlene Coria RN Entered By: Carlene Coria on 02/07/2020 14:36:32 -------------------------------------------------------------------------------- Nutrition Risk Screening Details Patient Name: Date of Service: Janice [name] [name] 02/07/2020 1:15 PM Medical Record Number: [id number] Patient Account Number: [id number] Date of Birth/Sex: Treating RN: May 17, 1941 (79 y.o. Orvan Falconer Primary Care Aloma Boch: Carol Ada Other Clinician: Referring Donnavin Vandenbrink: Treating Kia Stavros/Extender: Stefanie Libel, Candace Weeks in Treatment: 0 Height (in): 64 Weight (lbs): 262 Body Mass Index (BMI): 45 Nutrition Risk Screening Items Score Screening NUTRITION RISK SCREEN: I have an illness or condition that made me change the kind and/or amount of food I eat 0 No I eat fewer than two meals per day 0 No I eat few fruits and vegetables, or milk products 0 No I have three or more drinks of beer, liquor or wine almost every day 0 No I have tooth or mouth problems that make it hard for me to eat 0 No I don't always have enough money to buy the food I need 0 No I eat alone most of the time 0 No I take three or more different prescribed or over-the-counter drugs a day 1 Yes Without wanting to, I have lost or gained 10 pounds in the last six months 0 No I am not always physically able to shop, cook and/or feed myself 2 Yes Nutrition Protocols Good Risk Protocol Moderate Risk Protocol 0 Provide education on nutrition High Risk Proctocol Risk Level: Moderate Risk Score: 3 Electronic Signature(s) Signed: 02/07/2020 5:13:16 PM By: Carlene Coria RN Entered By: Carlene Coria on 02/07/2020 14:30:04

## 2020-02-08 NOTE — Progress Notes (Addendum)
Janice [name], Janice [name] ([id number]) Visit Report for 02/07/2020 Allergy List Details Patient Name: Date of Service: Janice [name], Janice [name] [name] 02/07/2020 1:15 PM Medical Record Number: [id number] Patient Account Number: [id number] Date of Birth/Sex: Treating RN: 14-Aug-1940 (79 y.o. Female) Carlene Coria Primary Care Lakresha Stifter: Carol Ada Other Clinician: Referring Kaelei Wheeler: Treating Rheannon Cerney/Extender: Stefanie Libel, Candace Weeks in Treatment: 0 Allergies Active Allergies gabapentin prednisone amlodipine besylate atorvastatin baclofen colesevelam furosemide irbesartan Myrbetriq ranitidine sulfamethoxazole tizanidine tramadol WelChol Allergy Notes Electronic Signature(s) Signed: 02/07/2020 5:13:16 PM By: Carlene Coria RN Entered By: Carlene Coria on 02/07/2020 14:58:39 -------------------------------------------------------------------------------- Arrival Information Details Patient Name: Date of Service: Janice [name] [name] 02/07/2020 1:15 PM Medical Record Number: [id number] Patient Account Number: [id number] Date of Birth/Sex: Treating RN: [birth year] (79 y.o. Female) Carlene Coria Primary Care Deandrae Wajda: Carol Ada Other Clinician: Referring Kyilee Gregg: Treating Hyrum Shaneyfelt/Extender: Lona Millard in Treatment: 0 Visit Information Patient Arrived: Gilford Rile Arrival Time: 14:18 Accompanied By: self Transfer Assistance: None Patient Identification Verified: Yes Secondary Verification Process Completed: Yes Patient Requires Transmission-Based Precautions: No Patient Has Alerts: No Electronic Signature(s) Signed: 02/07/2020 5:13:16 PM By: Carlene Coria RN Entered By: Carlene Coria on 02/07/2020 14:19:27 -------------------------------------------------------------------------------- Clinic Level of Care Assessment Details Patient Name: Date of Service: Janice [name] [name] 02/07/2020 1:15 PM Medical Record Number: [id number] Patient Account Number:  [id number] Date of Birth/Sex: Treating RN: [birth year] (79 y.o. Female) Levan Hurst Primary Care Odin Mariani: Carol Ada Other Clinician: Referring Concetta Guion: Treating Kaelyn Nauta/Extender: Stefanie Libel, Lenor Derrick in Treatment: 0 Clinic Level of Care Assessment Items TOOL 4 Quantity Score X- 1 0 Use when only an EandM is performed on FOLLOW-UP visit ASSESSMENTS - Nursing Assessment / Reassessment X- 1 10 Reassessment of Co-morbidities (includes updates in patient status) X- 1 5 Reassessment of Adherence to Treatment Plan ASSESSMENTS - Wound and Skin A ssessment / Reassessment X - Simple Wound Assessment / Reassessment - one wound 1 5 []  - 0 Complex Wound Assessment / Reassessment - multiple wounds []  - 0 Dermatologic / Skin Assessment (not related to wound area) ASSESSMENTS - Focused Assessment []  - 0 Circumferential Edema Measurements - multi extremities []  - 0 Nutritional Assessment / Counseling / Intervention X- 1 5 Lower Extremity Assessment (monofilament, tuning fork, pulses) []  - 0 Peripheral Arterial Disease Assessment (using hand held doppler) ASSESSMENTS - Ostomy and/or Continence Assessment and Care []  - 0 Incontinence Assessment and Management []  - 0 Ostomy Care Assessment and Management (repouching, etc.) PROCESS - Coordination of Care X - Simple Patient / Family Education for ongoing care 1 15 []  - 0 Complex (extensive) Patient / Family Education for ongoing care X- 1 10 Staff obtains Programmer, systems, Records, T Results / Process Orders est []  - 0 Staff telephones HHA, Nursing Homes / Clarify orders / etc []  - 0 Routine Transfer to another Facility (non-emergent condition) []  - 0 Routine Hospital Admission (non-emergent condition) X- 1 15 New Admissions / Biomedical engineer / Ordering NPWT Apligraf, etc. , []  - 0 Emergency Hospital Admission (emergent condition) X- 1 10 Simple Discharge Coordination []  - 0 Complex (extensive) Discharge  Coordination PROCESS - Special Needs []  - 0 Pediatric / Minor Patient Management []  - 0 Isolation Patient Management []  - 0 Hearing / Language / Visual special needs []  - 0 Assessment of Community assistance (transportation, D/C planning, etc.) []  - 0 Additional assistance / Altered mentation []  - 0 Support Surface(s) Assessment (bed, cushion, seat, etc.) INTERVENTIONS - Wound Cleansing / Measurement X - Simple Wound Cleansing - one wound  1 5 []  - 0 Complex Wound Cleansing - multiple wounds X- 1 5 Wound Imaging (photographs - any number of wounds) []  - 0 Wound Tracing (instead of photographs) X- 1 5 Simple Wound Measurement - one wound []  - 0 Complex Wound Measurement - multiple wounds INTERVENTIONS - Wound Dressings X - Small Wound Dressing one or multiple wounds 1 10 []  - 0 Medium Wound Dressing one or multiple wounds []  - 0 Large Wound Dressing one or multiple wounds X- 1 5 Application of Medications - topical []  - 0 Application of Medications - injection INTERVENTIONS - Miscellaneous []  - 0 External ear exam []  - 0 Specimen Collection (cultures, biopsies, blood, body fluids, etc.) []  - 0 Specimen(s) / Culture(s) sent or taken to Lab for analysis []  - 0 Patient Transfer (multiple staff / Civil Service fast streamer / Similar devices) []  - 0 Simple Staple / Suture removal (25 or less) []  - 0 Complex Staple / Suture removal (26 or more) []  - 0 Hypo / Hyperglycemic Management (close monitor of Blood Glucose) []  - 0 Ankle / Brachial Index (ABI) - do not check if billed separately X- 1 5 Vital Signs Has the patient been seen at the hospital within the last three years: Yes Total Score: 110 Level Of Care: New/Established - Level 3 Electronic Signature(s) Signed: 02/07/2020 5:04:17 PM By: Levan Hurst RN, BSN Entered By: Levan Hurst on 02/07/2020 15:09:18 -------------------------------------------------------------------------------- Encounter Discharge Information  Details Patient Name: Date of Service: Janice [name] [name] 02/07/2020 1:15 PM Medical Record Number: [id number] Patient Account Number: [id number] Date of Birth/Sex: Treating RN: May 26, 1941 (79 y.o. Female) Baruch Gouty Primary Care Sincere Liuzzi: Carol Ada Other Clinician: Referring Rinnah Peppel: Treating Keyia Moretto/Extender: Lona Millard in Treatment: 0 Encounter Discharge Information Items Post Procedure Vitals Discharge Condition: Stable Temperature (F): 97.7 Ambulatory Status: Walker Pulse (bpm): 90 Discharge Destination: Home Respiratory Rate (breaths/min): 20 Transportation: Private Auto Blood Pressure (mmHg): 159/86 Accompanied By: self Schedule Follow-up Appointment: Yes Clinical Summary of Care: Patient Declined Electronic Signature(s) Signed: 02/08/2020 5:34:53 PM By: Baruch Gouty RN, BSN Entered By: Baruch Gouty on 02/07/2020 15:34:11 -------------------------------------------------------------------------------- Lower Extremity Assessment Details Patient Name: Date of Service: Janice [name] [name] 02/07/2020 1:15 PM Medical Record Number: [id number] Patient Account Number: [id number] Date of Birth/Sex: Treating RN: [birth year] (79 y.o. Female) Carlene Coria Primary Care Kendelle Schweers: Carol Ada Other Clinician: Referring Dreshawn Hendershott: Treating Khylee Algeo/Extender: Stefanie Libel, Candace Weeks in Treatment: 0 Edema Assessment Assessed: [Left: No] [Right: No] E[Left: dema] [Right: :] Calf Left: Right: Point of Measurement: 48 cm From Medial Instep cm 53 cm Ankle Left: Right: Point of Measurement: 12 cm From Medial Instep cm 32 cm Notes declined removing shoe, unable to obtain pulses, unable to obtain ABI's Electronic Signature(s) Signed: 02/07/2020 5:13:16 PM By: Carlene Coria RN Entered By: Carlene Coria on 02/07/2020 14:39:40 -------------------------------------------------------------------------------- Multi Wound Chart  Details Patient Name: Date of Service: Janice [name] [name] 02/07/2020 1:15 PM Medical Record Number: [id number] Patient Account Number: [id number] Date of Birth/Sex: Treating RN: 1941/05/01 (79 y.o. Female) Levan Hurst Primary Care Winferd Wease: Carol Ada Other Clinician: Referring Sajan Cheatwood: Treating Shaunda Tipping/Extender: Stefanie Libel, Candace Weeks in Treatment: 0 Vital Signs Height(in): 64 Pulse(bpm): 90 Weight(lbs): 5 Blood Pressure(mmHg): 159/86 Body Mass Index(BMI): 45 Temperature(F): 97.7 Respiratory Rate(breaths/min): 18 Photos: [1:No Photos Right, Anterior Lower Leg] [N/A:N/A N/A] Wound Location: [1:Gradually Appeared] [N/A:N/A] Wounding Event: [1:Venous Leg Ulcer] [N/A:N/A] Primary Etiology: [1:Sleep Apnea] [N/A:N/A] Comorbid History: [1:07/30/2019] [N/A:N/A] Date Acquired: [1:0] [N/A:N/A] Weeks of Treatment: [1:Open] [N/A:N/A] Wound Status: [  1:1.8x3x0.1] [N/A:N/A] Measurements L x W x D (cm) [1:4.241] [N/A:N/A] A (cm) : rea [1:0.424] [N/A:N/A] Volume (cm) : [1:0.00%] [N/A:N/A] % Reduction in A rea: [1:0.00%] [N/A:N/A] % Reduction in Volume: [1:Full Thickness Without Exposed] [N/A:N/A] Classification: [1:Support Structures Medium] [N/A:N/A] Exudate A mount: [1:Serosanguineous] [N/A:N/A] Exudate Type: [1:red, brown] [N/A:N/A] Exudate Color: [1:Flat and Intact] [N/A:N/A] Wound Margin: [1:Small (1-33%)] [N/A:N/A] Granulation A mount: [1:Red] [N/A:N/A] Granulation Quality: [1:Large (67-100%)] [N/A:N/A] Necrotic A mount: [1:Fat Layer (Subcutaneous Tissue)] [N/A:N/A] Exposed Structures: [1:Exposed: Yes Fascia: No Tendon: No Muscle: No Joint: No Bone: No Large (67-100%)] [N/A:N/A] Epithelialization: [1:Chemical/Enzymatic/Mechanical] [N/A:N/A] Debridement: [1:N/A] [N/A:N/A] Instrument: [1:None] [N/A:N/A] Bleeding: [1:0] [N/A:N/A] Procedural Pain: [1:0] [N/A:N/A] Post Procedural Pain: Debridement Treatment Response: Procedure was tolerated well  [N/A:N/A] Post Debridement Measurements L x 1.8x3x0.1 [N/A:N/A] W x D (cm) [1:0.424] [N/A:N/A] Post Debridement Volume: (cm) [1:Debridement] [N/A:N/A] Treatment Notes Electronic Signature(s) Signed: 02/07/2020 5:04:17 PM By: Levan Hurst RN, BSN Signed: 02/08/2020 8:12:39 AM By: Linton Ham MD Entered By: Linton Ham on 02/07/2020 15:22:25 -------------------------------------------------------------------------------- Multi-Disciplinary Care Plan Details Patient Name: Date of Service: Janice [name] [name] 02/07/2020 1:15 PM Medical Record Number: [id number] Patient Account Number: [id number] Date of Birth/Sex: Treating RN: 1941-07-29 (79 y.o. Female) Levan Hurst Primary Care Jaquana Geiger: Carol Ada Other Clinician: Referring Adnan Vanvoorhis: Treating Jayzon Taras/Extender: Stefanie Libel, Lenor Derrick in Treatment: 0 Active Inactive Abuse / Safety / Falls / Self Care Management Nursing Diagnoses: Potential for falls Potential for injury related to falls Goals: Patient will not experience any injury related to falls Date Initiated: 02/07/2020 Target Resolution Date: 03/17/2020 Goal Status: Active Patient/caregiver will verbalize/demonstrate measures taken to prevent injury and/or falls Date Initiated: 02/07/2020 Target Resolution Date: 03/17/2020 Goal Status: Active Interventions: Assess Activities of Daily Living upon admission and as needed Assess fall risk on admission and as needed Assess: immobility, friction, shearing, incontinence upon admission and as needed Assess impairment of mobility on admission and as needed per policy Assess personal safety and home safety (as indicated) on admission and as needed Assess self care needs on admission and as needed Provide education on fall prevention Provide education on personal and home safety Notes: Venous Leg Ulcer Nursing Diagnoses: Actual venous Insuffiency (use after diagnosis is confirmed) Knowledge deficit  related to disease process and management Goals: Patient will maintain optimal edema control Date Initiated: 02/07/2020 Target Resolution Date: 03/17/2020 Goal Status: Active Patient/caregiver will verbalize understanding of disease process and disease management Date Initiated: 02/07/2020 Target Resolution Date: 03/17/2020 Goal Status: Active Interventions: Assess peripheral edema status every visit. Compression as ordered Provide education on venous insufficiency Notes: Wound/Skin Impairment Nursing Diagnoses: Impaired tissue integrity Knowledge deficit related to ulceration/compromised skin integrity Goals: Patient/caregiver will verbalize understanding of skin care regimen Date Initiated: 02/07/2020 Target Resolution Date: 03/17/2020 Goal Status: Active Ulcer/skin breakdown will have a volume reduction of 30% by week 4 Date Initiated: 02/07/2020 Target Resolution Date: 03/17/2020 Goal Status: Active Interventions: Assess patient/caregiver ability to obtain necessary supplies Assess patient/caregiver ability to perform ulcer/skin care regimen upon admission and as needed Assess ulceration(s) every visit Provide education on ulcer and skin care Notes: Electronic Signature(s) Signed: 02/07/2020 5:04:17 PM By: Levan Hurst RN, BSN Entered By: Levan Hurst on 02/07/2020 14:56:42 -------------------------------------------------------------------------------- Pain Assessment Details Patient Name: Date of Service: Janice [name] [name] 02/07/2020 1:15 PM Medical Record Number: [id number] Patient Account Number: [id number] Date of Birth/Sex: Treating RN: [birth year] (79 y.o. Female) Carlene Coria Primary Care Taniah Reinecke: Carol Ada Other Clinician: Referring Jarrod Bodkins: Treating Blane Worthington/Extender: Stefanie Libel, Hal Hope Weeks in Treatment:  0 Active Problems Location of Pain Severity and Description of Pain Patient Has Paino No Site Locations Pain Management and  Medication Current Pain Management: Electronic Signature(s) Signed: 02/07/2020 5:13:16 PM By: Carlene Coria RN Entered By: Carlene Coria on 02/07/2020 14:42:19 -------------------------------------------------------------------------------- Patient/Caregiver Education Details Patient Name: Date of Service: Janice [name] Janice Jerilynn Mages 7/12/2021andnbsp1:15 PM Medical Record Number: [id number] Patient Account Number: [id number] Date of Birth/Gender: Treating RN: [birth year] (79 y.o. Female) Levan Hurst Primary Care Physician: Carol Ada Other Clinician: Referring Physician: Treating Physician/Extender: Lona Millard in Treatment: 0 Education Assessment Education Provided To: Patient Education Topics Provided Safety: Methods: Explain/Verbal Responses: State content correctly Venous: Methods: Explain/Verbal Responses: State content correctly Wound/Skin Impairment: Methods: Explain/Verbal Responses: State content correctly Electronic Signature(s) Signed: 02/07/2020 5:04:17 PM By: Levan Hurst RN, BSN Entered By: Levan Hurst on 02/07/2020 14:57:07 -------------------------------------------------------------------------------- Wound Assessment Details Patient Name: Date of Service: Janice [name] [name] 02/07/2020 1:15 PM Medical Record Number: [id number] Patient Account Number: [id number] Date of Birth/Sex: Treating RN: Dec 07, 1940 (79 y.o. Female) Carlene Coria Primary Care Nixxon Faria: Carol Ada Other Clinician: Referring Marwan Lipe: Treating Copelyn Widmer/Extender: Stefanie Libel, Candace Weeks in Treatment: 0 Wound Status Wound Number: 1 Primary Etiology: Venous Leg Ulcer Wound Location: Right, Anterior Lower Leg Wound Status: Open Wounding Event: Gradually Appeared Comorbid History: Sleep Apnea Date Acquired: 07/30/2019 Weeks Of Treatment: 0 Clustered Wound: No Wound Measurements Length: (cm) 1.8 Width: (cm) 3 Depth: (cm) 0.1 Area: (cm)  4.241 Volume: (cm) 0.424 % Reduction in Area: 0% % Reduction in Volume: 0% Epithelialization: Large (67-100%) Tunneling: No Undermining: No Wound Description Classification: Full Thickness Without Exposed Support Structures Wound Margin: Flat and Intact Exudate Amount: Medium Exudate Type: Serosanguineous Exudate Color: red, brown Foul Odor After Cleansing: No Slough/Fibrino Yes Wound Bed Granulation Amount: Small (1-33%) Exposed Structure Granulation Quality: Red Fascia Exposed: No Necrotic Amount: Large (67-100%) Fat Layer (Subcutaneous Tissue) Exposed: Yes Necrotic Quality: Adherent Slough Tendon Exposed: No Muscle Exposed: No Joint Exposed: No Bone Exposed: No Treatment Notes Wound #1 (Right, Anterior Lower Leg) 2. Periwound Care Skin Prep 3. Primary Dressing Applied Iodoflex 4. Secondary Dressing ABD Pad 5. Secured With Recruitment consultant) Signed: 02/07/2020 5:04:17 PM By: Levan Hurst RN, BSN Signed: 02/07/2020 5:13:16 PM By: Carlene Coria RN Entered By: Levan Hurst on 02/07/2020 14:58:26 -------------------------------------------------------------------------------- [place] Details Patient Name: Date of Service: Janice [name] [name] 02/07/2020 1:15 PM Medical Record Number: [id number] Patient Account Number: [id number] Date of Birth/Sex: Treating RN: [birth year] (79 y.o. Female) Carlene Coria Primary Care Anaika Santillano: Carol Ada Other Clinician: Referring Raymond Bhardwaj: Treating Aldric Wenzler/Extender: Stefanie Libel, Candace Weeks in Treatment: 0 Vital Signs Time Taken: 14:19 Temperature (F): 97.7 Height (in): 64 Pulse (bpm): 90 Source: Stated Respiratory Rate (breaths/min): 18 Weight (lbs): 262 Blood Pressure (mmHg): 159/86 Source: Stated Reference Range: 80 - 120 mg / dl Body Mass Index (BMI): 45 Electronic Signature(s) Signed: 02/07/2020 5:13:16 PM By: Carlene Coria RN Entered By: Carlene Coria on 02/07/2020 14:20:13

## 2020-02-10 DIAGNOSIS — R2689 Other abnormalities of gait and mobility: Secondary | ICD-10-CM | POA: Diagnosis not present

## 2020-02-10 DIAGNOSIS — M6281 Muscle weakness (generalized): Secondary | ICD-10-CM | POA: Diagnosis not present

## 2020-02-14 ENCOUNTER — Other Ambulatory Visit: Payer: Self-pay

## 2020-02-14 ENCOUNTER — Encounter (HOSPITAL_BASED_OUTPATIENT_CLINIC_OR_DEPARTMENT_OTHER): Payer: Medicare Other | Admitting: Internal Medicine

## 2020-02-14 DIAGNOSIS — I872 Venous insufficiency (chronic) (peripheral): Secondary | ICD-10-CM | POA: Diagnosis not present

## 2020-02-14 DIAGNOSIS — L97812 Non-pressure chronic ulcer of other part of right lower leg with fat layer exposed: Secondary | ICD-10-CM | POA: Diagnosis not present

## 2020-02-14 DIAGNOSIS — M3313 Other dermatomyositis without myopathy: Secondary | ICD-10-CM | POA: Diagnosis not present

## 2020-02-14 DIAGNOSIS — G4733 Obstructive sleep apnea (adult) (pediatric): Secondary | ICD-10-CM | POA: Diagnosis not present

## 2020-02-14 DIAGNOSIS — L97818 Non-pressure chronic ulcer of other part of right lower leg with other specified severity: Secondary | ICD-10-CM | POA: Diagnosis not present

## 2020-02-14 DIAGNOSIS — E039 Hypothyroidism, unspecified: Secondary | ICD-10-CM | POA: Diagnosis not present

## 2020-02-14 DIAGNOSIS — I890 Lymphedema, not elsewhere classified: Secondary | ICD-10-CM | POA: Diagnosis not present

## 2020-02-14 DIAGNOSIS — I10 Essential (primary) hypertension: Secondary | ICD-10-CM | POA: Diagnosis not present

## 2020-02-15 NOTE — Progress Notes (Signed)
PRISTINE, GLADHILL ([id number]) Visit Report for 02/14/2020 Debridement Details Patient Name: Date of Service: Janice [name], Janice [name] 02/14/2020 1:15 PM Medical Record Number: [id number] Patient Account Number: [id number] Date of Birth/Sex: Treating RN: [birth year] (79 y.o. [name] Primary Care Provider: Carol Ada Other Clinician: Referring Provider: Treating Provider/Extender: Stefanie Libel, Lenor Derrick in Treatment: 1 Debridement Performed for Assessment: Wound #1 Right,Anterior Lower Leg Performed By: Physician Ricard Dillon., MD Debridement Type: Debridement Severity of Tissue Pre Debridement: Fat layer exposed Level of Consciousness (Pre-procedure): Awake and Alert Pre-procedure Verification/Time Out Yes - 13:33 Taken: Start Time: 13:33 Pain Control: Other : Benzocaine 20% T Area Debrided (L x W): otal 2 (cm) x 3.1 (cm) = 6.2 (cm) Tissue and other material debrided: Viable, Non-Viable, Slough, Subcutaneous, Slough Level: Skin/Subcutaneous Tissue Debridement Description: Excisional Instrument: Curette Bleeding: Minimum Hemostasis Achieved: Pressure End Time: 13:34 Procedural Pain: 3 Post Procedural Pain: 0 Response to Treatment: Procedure was tolerated well Level of Consciousness (Post- Awake and Alert procedure): Post Debridement Measurements of Total Wound Length: (cm) 2 Width: (cm) 3.1 Depth: (cm) 0.1 Volume: (cm) 0.487 Character of Wound/Ulcer Post Debridement: Requires Further Debridement Severity of Tissue Post Debridement: Fat layer exposed Post Procedure Diagnosis Same as Pre-procedure Electronic Signature(s) Signed: 02/14/2020 4:12:59 PM By: Levan Hurst RN, BSN Signed: 02/15/2020 6:00:01 PM By: Linton Ham MD Entered By: Linton Ham on 02/14/2020 13:41:41 -------------------------------------------------------------------------------- HPI Details Patient Name: Date of Service: Janice Fordyce [name] 02/14/2020 1:15 PM Medical  Record Number: [id number] Patient Account Number: [id number] Date of Birth/Sex: Treating RN: 1941/02/01 (79 y.o. [name] Primary Care Provider: Carol Ada Other Clinician: Referring Provider: Treating Provider/Extender: Stefanie Libel, Lenor Derrick in Treatment: 1 History of Present Illness HPI Description: ADMISSION 02/07/2020 This is an 79 year old woman who is transferring from our sister clinic in [place] after their closure. She has been treated by Dr. Jerilee Hoh in the clinic there since the fall 2018. She was healed in August 2019 but readmitted to the clinic in January 2021. She has compression pumps that were apparently arranged for her by Dr. Sharol Given in the past she uses them once a day although she has not been able to do this recently subsequent to a death of a close friend. 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 Her particular condition is extremely painful to the touch therefore she cannot tolerate stockings or compression wraps although she has not had 1 of these recently. They have been using calcium alginate on the wound. Recently a culture was done and she completed a course of ciprofloxacin. She thinks the wound has been gradually contracting. 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 She appears to be walking with a walker. What drives with hand controllers. She has a custom shoe with an AFO brace on the right leg 7/19; patient was using twice a day compression pumps up until 2 days ago she had some personal issues and then was scared by using the [facility] we are having lightening. She is dressing  this is self with Iodosorb/Iodoflex. She is intolerant of compression wraps Electronic Signature(s) Signed: 02/15/2020 6:00:01 PM By: Linton Ham MD Entered By: Linton Ham on 02/14/2020 13:42:29 -------------------------------------------------------------------------------- Physical Exam Details Patient  Name: Date of Service: Janice [name], Janice [name] 02/14/2020 1:15 PM Medical Record Number: [id number] Patient Account Number: [id number] Date of Birth/Sex: Treating RN: [birth year] (79 y.o. [name] Primary Care Provider: Carol Ada Other Clinician: Referring Provider: Treating Provider/Extender: Stefanie Libel, Lenor Derrick in Treatment: 1 Constitutional Patient is hypertensive.. Pulse regular and within target range for patient.Marland Kitchen Respirations regular, non-labored and within target range.. Temperature is normal and within the target range for the patient.Marland Kitchen Appears in no distress. Notes Wound exam; the area in question is on the right anterior tibia just above the ankle. Severe lymphedema. Less surrounding stasis inflammation. Necrotic debris on the surface debrided with a #5 curette she does not tolerate this well. Electronic Signature(s) Signed: 02/15/2020 6:00:01 PM By: Linton Ham MD Entered By: Linton Ham on 02/14/2020 13:44:54 -------------------------------------------------------------------------------- Physician Orders Details Patient Name: Date of Service: Janice Fordyce [name] 02/14/2020 1:15 PM Medical Record Number: [id number] Patient Account Number: [id number] Date of Birth/Sex: Treating RN: 28-Nov-1940 (79 y.o. 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 Dressing Change Frequency Wound #1 Right,Anterior Lower Leg Change Dressing every other day. Wound Cleansing Wound #1 Right,Anterior Lower Leg May shower and wash wound with soap and water. - on days that dressing is changed Primary Wound Dressing Wound #1 Right,Anterior Lower Leg Iodoflex Secondary Dressing Wound #1 Right,Anterior Lower Leg Other: - ABD pad and paper tape Edema Control Avoid standing for long periods of time Elevate legs to the level of the heart or above for 30 minutes daily and/or when sitting, a frequency of: - throughout the day Exercise regularly Segmental Compressive Device. - Lymphedema pumps twice a day for 1 hour each time Electronic Signature(s) Signed: 02/14/2020 4:12:59 PM By: Levan Hurst RN, BSN Signed: 02/15/2020 6:00:01 PM By: Linton Ham MD Entered By: Levan Hurst on 02/14/2020 13:33:23 -------------------------------------------------------------------------------- Problem List Details Patient Name: Date of Service: Janice Fordyce [name] 02/14/2020 1:15 PM Medical Record Number: [id number] Patient Account Number: [id number] Date of Birth/Sex: Treating RN: [birth year] (79 y.o. 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 02/14/2020 1:15 PM Medical Record Number: [id number] Patient Account Number: [id number] Date of Birth/Sex: Treating RN: 11/04/1940 (79 y.o. [name] Primary Care Provider: Carol Ada Other Clinician: Referring Provider: Treating Provider/Extender: Stefanie Libel, Lenor Derrick in Treatment: 1 Subjective History of Present Illness (HPI) ADMISSION 02/07/2020 This is an 79 year old woman who is transferring from our sister clinic in [place] after their closure. She has been treated by Dr. Jerilee Hoh in the clinic there since the fall 2018. She was healed in August 2019 but readmitted to the clinic in January 2021. She  has compression pumps that were apparently arranged for her by Dr. Sharol Given in the past she uses them once a day although she has not been able to do this recently subsequent to a death of a close friend. 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 Her particular condition is extremely painful to the touch therefore she cannot tolerate stockings or compression wraps although she has not had 1 of these recently. They have been using calcium alginate on the wound. Recently a culture was done and she completed a course of ciprofloxacin. She thinks the wound has been gradually contracting. 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 She appears to be walking with a walker. What drives with hand controllers. She has a custom shoe with  an AFO brace on the right leg 7/19; patient was using twice a day compression pumps up until 2 days ago she had some personal issues and then was scared by using the [facility] we are having lightening. She is dressing this is self with Iodosorb/Iodoflex. She is intolerant of compression wraps Objective Constitutional Patient is hypertensive.. Pulse regular and within target range for patient.Marland Kitchen Respirations regular, non-labored and within target range.. Temperature is normal and within the target range for the patient.Marland Kitchen Appears in no distress. Vitals Time Taken: 1:17 PM, Height: 64 in, Weight: 262 lbs, BMI: 45, Temperature: 98.4 F, Pulse: 84 bpm, Respiratory Rate: 20 breaths/min, Blood Pressure: 168/89 mmHg. General Notes: Wound exam; the area in question is on the right anterior tibia just above the ankle. Severe lymphedema. Less surrounding stasis inflammation. Necrotic debris on the surface debrided with a #5 curette she does not tolerate this well. Integumentary (Hair, Skin) Wound #1 status is Open. Original cause of wound was Gradually Appeared. The wound is located on the Right,Anterior Lower Leg. The wound measures 2cm length x 3.1cm width x 0.1cm depth; 4.869cm^2 area and 0.487cm^3 volume. There is Fat Layer (Subcutaneous Tissue) Exposed exposed. There is no tunneling or undermining noted. There is a medium amount of serosanguineous drainage noted. The wound margin is flat and intact. There is small (1-33%) red granulation within the wound bed. There is a large (67-100%) amount of necrotic tissue within the wound bed including Adherent Slough. 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 There was a Excisional Skin/Subcutaneous Tissue Debridement with a total area of 6.2 sq cm performed by Ricard Dillon., MD. With the following instrument(s): Curette to remove Viable and Non-Viable tissue/material. Material removed includes Subcutaneous Tissue and Slough and after achieving pain control using Other (Benzocaine 20%). No specimens were taken. A time out was conducted at 13:33, prior to the start of the procedure. A Minimum amount of bleeding was controlled with Pressure. The procedure was tolerated well with a pain level of 3 throughout and a pain level of 0 following the procedure. Post Debridement Measurements: 2cm length x 3.1cm width x 0.1cm depth; 0.487cm^3 volume. Character of Wound/Ulcer Post Debridement requires further  debridement. Severity of Tissue Post Debridement is: Fat layer exposed. Post procedure Diagnosis Wound #1: Same as Pre-Procedure Plan Follow-up Appointments: Return Appointment in 1 week. Dressing Change Frequency: Wound #1 Right,Anterior Lower Leg: Change Dressing every other day. Wound Cleansing: Wound #1 Right,Anterior Lower Leg: May shower and wash wound with soap and water. - on days that dressing is changed Primary Wound Dressing: Wound #1 Right,Anterior Lower Leg: Iodoflex Secondary Dressing: Wound #1 Right,Anterior Lower Leg: Other: - ABD pad and paper tape Edema Control: Avoid standing for long periods of time Elevate legs to the level of the heart or above for 30 minutes daily and/or when sitting, a frequency of: - throughout the day Exercise regularly Segmental Compressive Device. - Lymphedema pumps twice a day for 1 hour each time 1. We continued with Iodoflex, ABDs. 2. She does not tolerate compression apparently because of a painful myopathy 3. She is agreeing to use her compression pumps twice a day for an hour. I told her this was crucial if she is to have any chance of healing this Electronic  Signature(s) Signed: 02/15/2020 6:00:01 PM By: Linton Ham MD Entered By: Linton Ham on 02/14/2020 13:45:54 -------------------------------------------------------------------------------- SuperBill Details Patient Name: Date of Service: Janice Fordyce [name] 02/14/2020 Medical Record Number: [id number] Patient Account Number: [id number] Date of Birth/Sex: Treating RN: 22-Oct-1940 (79 y.o. 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

## 2020-02-17 DIAGNOSIS — R2689 Other abnormalities of gait and mobility: Secondary | ICD-10-CM | POA: Diagnosis not present

## 2020-02-17 DIAGNOSIS — M6281 Muscle weakness (generalized): Secondary | ICD-10-CM | POA: Diagnosis not present

## 2020-02-18 NOTE — Progress Notes (Signed)
Janice [name], Janice [name] ([id number]) Visit Report for 02/14/2020 Arrival Information Details Patient Name: Date of Service: Janice [name], Janice [name] [name] 02/14/2020 1:15 PM Medical Record Number: [id number] Patient Account Number: [id number] Date of Birth/Sex: Treating RN: 06-11-1941 (79 y.o. Janice [name] Primary Care Shayley Medlin: Carol Ada Other Clinician: Referring Chue Berkovich: Treating Janisse Ghan/Extender: Lona Millard in Treatment: 1 Visit Information History Since Last Visit All ordered tests and consults were completed: No Patient Arrived: Janice [name] Added or deleted any medications: No Arrival Time: 13:17 Any new allergies or adverse reactions: No Accompanied By: self Had a fall or experienced change in No Transfer Assistance: None activities of daily living that may affect Patient Identification Verified: Yes risk of falls: Secondary Verification Process Completed: Yes Signs or symptoms of abuse/neglect since last visito No Patient Requires Transmission-Based Precautions: No Hospitalized since last visit: No Patient Has Alerts: No Implantable device outside of the clinic excluding No cellular tissue based products placed in the center since last visit: Has Dressing in Place as Prescribed: Yes Pain Present Now: No Electronic Signature(s) Signed: 02/18/2020 5:42:58 PM By: Carlene Coria RN Entered By: Carlene Coria on 02/14/2020 13:17:42 -------------------------------------------------------------------------------- Encounter Discharge Information Details Patient Name: Date of Service: Janice Fordyce [name] 02/14/2020 1:15 PM Medical Record Number: [id number] Patient Account Number: [id number] Date of Birth/Sex: Treating RN: [birth year] (79 y.o. Janice [name] Primary Care Ahmet Schank: Carol Ada Other Clinician: Referring Hersh Minney: Treating Cher Egnor/Extender: Lona Millard in Treatment: 1 Encounter Discharge Information Items Post Procedure  Vitals Discharge Condition: Stable Temperature (F): 98.4 Ambulatory Status: Walker Pulse (bpm): 84 Discharge Destination: Home Respiratory Rate (breaths/min): 20 Transportation: Private Auto Blood Pressure (mmHg): 168/89 Accompanied By: self Schedule Follow-up Appointment: Yes Clinical Summary of Care: Patient Declined Electronic Signature(s) Signed: 02/14/2020 4:04:58 PM By: Kela Millin Entered By: Kela Millin on 02/14/2020 13:53:38 -------------------------------------------------------------------------------- Lower Extremity Assessment Details Patient Name: Date of Service: Janice Fordyce [name] 02/14/2020 1:15 PM Medical Record Number: [id number] Patient Account Number: [id number] Date of Birth/Sex: Treating RN: 03/15/1941 (79 y.o. Janice [name] Primary Care Annai Heick: Carol Ada Other Clinician: Referring Naiyah Klostermann: Treating Jhamir Pickup/Extender: Stefanie Libel, Candace Weeks in Treatment: 1 Edema Assessment Assessed: [Left: No] [Right: No] E[Left: dema] [Right: :] Calf Left: Right: Point of Measurement: 48 cm From Medial Instep cm 48 cm Ankle Left: Right: Point of Measurement: 12 cm From Medial Instep cm 32 cm Electronic Signature(s) Signed: 02/18/2020 5:42:58 PM By: Carlene Coria RN Entered By: Carlene Coria on 02/14/2020 13:21:22 -------------------------------------------------------------------------------- Multi Wound Chart Details Patient Name: Date of Service: Janice Fordyce [name] 02/14/2020 1:15 PM Medical Record Number: [id number] Patient Account Number: [id number] Date of Birth/Sex: Treating RN: 1941-06-24 (79 y.o. Janice [name] Primary Care Auron Tadros: Carol Ada Other Clinician: Referring Oddie Bottger: Treating Lucile Didonato/Extender: Stefanie Libel, Candace Weeks in Treatment: 1 Vital Signs Height(in): 64 Pulse(bpm): 84 Weight(lbs): 23 Blood Pressure(mmHg): 168/89 Body Mass Index(BMI): 45 Temperature(F): 98.4 Respiratory  Rate(breaths/min): 20 Photos: [1:No Photos Right, Anterior Lower Leg] [N/A:N/A N/A] Wound Location: [1:Gradually Appeared] [N/A:N/A] Wounding Event: [1:Venous Leg Ulcer] [N/A:N/A] Primary Etiology: [1:Sleep Apnea] [N/A:N/A] Comorbid History: [1:07/30/2019] [N/A:N/A] Date Acquired: [1:1] [N/A:N/A] Weeks of Treatment: [1:Open] [N/A:N/A] Wound Status: [1:2x3.1x0.1] [N/A:N/A] Measurements L x W x D (cm) [1:4.869] [N/A:N/A] A (cm) : rea [1:0.487] [N/A:N/A] Volume (cm) : [1:-14.80%] [N/A:N/A] % Reduction in A rea: [1:-14.90%] [N/A:N/A] % Reduction in Volume: [1:Full Thickness Without Exposed] [N/A:N/A] Classification: [1:Support Structures Medium] [N/A:N/A] Exudate Amount: [1:Serosanguineous] [N/A:N/A] Exudate Type: [1:red, brown] [N/A:N/A] Exudate Color: [1:Flat and Intact] [N/A:N/A]  Wound Margin: [1:Small (1-33%)] [N/A:N/A] Granulation Amount: [1:Red] [N/A:N/A] Granulation Quality: [1:Large (67-100%)] [N/A:N/A] Necrotic Amount: [1:Fat Layer (Subcutaneous Tissue)] [N/A:N/A] Exposed Structures: [1:Exposed: Yes Fascia: No Tendon: No Muscle: No Joint: No Bone: No Large (67-100%)] [N/A:N/A] Epithelialization: [1:Debridement - Excisional] [N/A:N/A] Debridement: Pre-procedure Verification/Time Out 13:33 [N/A:N/A] Taken: [1:Other] [N/A:N/A] Pain Control: [1:Subcutaneous, Slough] [N/A:N/A] Tissue Debrided: [1:Skin/Subcutaneous Tissue] [N/A:N/A] Level: [1:6.2] [N/A:N/A] Debridement A (sq cm): [1:rea Curette] [N/A:N/A] Instrument: [1:Minimum] [N/A:N/A] Bleeding: [1:Pressure] [N/A:N/A] Hemostasis A chieved: [1:3] [N/A:N/A] Procedural Pain: [1:0] [N/A:N/A] Post Procedural Pain: [1:Procedure was tolerated well] [N/A:N/A] Debridement Treatment Response: [1:2x3.1x0.1] [N/A:N/A] Post Debridement Measurements L x W x D (cm) [1:0.487] [N/A:N/A] Post Debridement Volume: (cm) [1:Debridement] [N/A:N/A] Treatment Notes Electronic Signature(s) Signed: 02/14/2020 4:12:59 PM By: Levan Hurst RN,  BSN Signed: 02/15/2020 6:00:01 PM By: Linton Ham MD Entered By: Linton Ham on 02/14/2020 13:39:38 -------------------------------------------------------------------------------- Multi-Disciplinary Care Plan Details Patient Name: Date of Service: Janice Fordyce [name] 02/14/2020 1:15 PM Medical Record Number: [id number] Patient Account Number: [id number] Date of Birth/Sex: Treating RN: [birth year] (79 y.o. Janice [name] Primary Care Keniya Schlotterbeck: Carol Ada Other Clinician: Referring Jereld Presti: Treating Calin Ellery/Extender: Stefanie Libel, Lenor Derrick in Treatment: 1 Active Inactive Abuse / Safety / Falls / Self Care Management Nursing Diagnoses: Potential for falls Potential for injury related to falls Goals: Patient will not experience any injury related to falls Date Initiated: 02/07/2020 Target Resolution Date: 03/17/2020 Goal Status: Active Patient/caregiver will verbalize/demonstrate measures taken to prevent injury and/or falls Date Initiated: 02/07/2020 Target Resolution Date: 03/17/2020 Goal Status: Active Interventions: Assess Activities of Daily Living upon admission and as needed Assess fall risk on admission and as needed Assess: immobility, friction, shearing, incontinence upon admission and as needed Assess impairment of mobility on admission and as needed per policy Assess personal safety and home safety (as indicated) on admission and as needed Assess self care needs on admission and as needed Provide education on fall prevention Provide education on personal and home safety Notes: Venous Leg Ulcer Nursing Diagnoses: Actual venous Insuffiency (use after diagnosis is confirmed) Knowledge deficit related to disease process and management Goals: Patient will maintain optimal edema control Date Initiated: 02/07/2020 Target Resolution Date: 03/17/2020 Goal Status: Active Patient/caregiver will verbalize understanding of disease process and disease  management Date Initiated: 02/07/2020 Target Resolution Date: 03/17/2020 Goal Status: Active Interventions: Assess peripheral edema status every visit. Compression as ordered Provide education on venous insufficiency Notes: Wound/Skin Impairment Nursing Diagnoses: Impaired tissue integrity Knowledge deficit related to ulceration/compromised skin integrity Goals: Patient/caregiver will verbalize understanding of skin care regimen Date Initiated: 02/07/2020 Target Resolution Date: 03/17/2020 Goal Status: Active Ulcer/skin breakdown will have a volume reduction of 30% by week 4 Date Initiated: 02/07/2020 Target Resolution Date: 03/17/2020 Goal Status: Active Interventions: Assess patient/caregiver ability to obtain necessary supplies Assess patient/caregiver ability to perform ulcer/skin care regimen upon admission and as needed Assess ulceration(s) every visit Provide education on ulcer and skin care Notes: Electronic Signature(s) Signed: 02/14/2020 4:12:59 PM By: Levan Hurst RN, BSN Entered By: Levan Hurst on 02/14/2020 13:36:34 -------------------------------------------------------------------------------- Pain Assessment Details Patient Name: Date of Service: Janice Fordyce [name] 02/14/2020 1:15 PM Medical Record Number: [id number] Patient Account Number: [id number] Date of Birth/Sex: Treating RN: Jun 12, 1941 (79 y.o. Janice [name] Primary Care Pheonix Clinkscale: Carol Ada Other Clinician: Referring Adellyn Capek: Treating Alyse Kathan/Extender: Lona Millard in Treatment: 1 Active Problems Location of Pain Severity and Description of Pain Patient Has Paino No Site Locations Pain Management and Medication Current Pain Management: Electronic Signature(s) Signed: 02/18/2020  5:42:58 PM By: Carlene Coria RN Entered By: Carlene Coria on 02/14/2020 13:18:21 -------------------------------------------------------------------------------- Patient/Caregiver  Education Details Patient Name: Date of Service: Janice Fordyce Janice Jerilynn Mages 7/19/2021andnbsp1:15 PM Medical Record Number: [id number] Patient Account Number: [id number] Date of Birth/Gender: Treating RN: 11-15-1940 (79 y.o. Janice [name] Primary Care Physician: Carol Ada Other Clinician: Referring Physician: Treating Physician/Extender: Lona Millard in Treatment: 1 Education Assessment Education Provided To: Patient Education Topics Provided Venous: Methods: Explain/Verbal Responses: State content correctly Wound/Skin Impairment: Methods: Explain/Verbal Responses: State content correctly Electronic Signature(s) Signed: 02/14/2020 4:12:59 PM By: Levan Hurst RN, BSN Entered By: Levan Hurst on 02/14/2020 13:36:53 -------------------------------------------------------------------------------- Wound Assessment Details Patient Name: Date of Service: Janice Fordyce [name] 02/14/2020 1:15 PM Medical Record Number: [id number] Patient Account Number: [id number] Date of Birth/Sex: Treating RN: 1941/02/07 (79 y.o. Janice [name] Primary Care Belen Zwahlen: Carol Ada Other Clinician: Referring Kalany Diekmann: Treating Hajer Dwyer/Extender: Stefanie Libel, Candace Weeks in Treatment: 1 Wound Status Wound Number: 1 Primary Etiology: Venous Leg Ulcer Wound Location: Right, Anterior Lower Leg Wound Status: Open Wounding Event: Gradually Appeared Comorbid History: Sleep Apnea Date Acquired: 07/30/2019 Weeks Of Treatment: 1 Clustered Wound: No Photos Photo Uploaded By: Mikeal Hawthorne on 02/15/2020 16:12:31 Wound Measurements Length: (cm) 2 Width: (cm) 3.1 Depth: (cm) 0.1 Area: (cm) 4.869 Volume: (cm) 0.487 % Reduction in Area: -14.8% % Reduction in Volume: -14.9% Epithelialization: Large (67-100%) Tunneling: No Undermining: No Wound Description Classification: Full Thickness Without Exposed Support Structures Wound Margin: Flat and Intact Exudate  Amount: Medium Exudate Type: Serosanguineous Exudate Color: red, brown Foul Odor After Cleansing: No Slough/Fibrino Yes Wound Bed Granulation Amount: Small (1-33%) Exposed Structure Granulation Quality: Red Fascia Exposed: No Necrotic Amount: Large (67-100%) Fat Layer (Subcutaneous Tissue) Exposed: Yes Necrotic Quality: Adherent Slough Tendon Exposed: No Muscle Exposed: No Joint Exposed: No Bone Exposed: No Treatment Notes Wound #1 (Right, Anterior Lower Leg) 1. Cleanse With Wound Cleanser Soap and water 3. Primary Dressing Applied Iodoflex 4. Secondary Dressing ABD Pad Dry Gauze 5. Secured With Recruitment consultant) Signed: 02/18/2020 5:42:58 PM By: Carlene Coria RN Entered By: Carlene Coria on 02/14/2020 13:24:10 -------------------------------------------------------------------------------- Vitals Details Patient Name: Date of Service: Janice Fordyce [name] 02/14/2020 1:15 PM Medical Record Number: [id number] Patient Account Number: [id number] Date of Birth/Sex: Treating RN: Apr 06, 1941 (79 y.o. Janice [name] Primary Care Reagen Goates: Carol Ada Other Clinician: Referring Volanda Mangine: Treating Kongmeng Santoro/Extender: Stefanie Libel, Lenor Derrick in Treatment: 1 Vital Signs Time Taken: 13:17 Temperature (F): 98.4 Height (in): 64 Pulse (bpm): 84 Weight (lbs): 262 Respiratory Rate (breaths/min): 20 Body Mass Index (BMI): 45 Blood Pressure (mmHg): 168/89 Reference Range: 80 - 120 mg / dl Electronic Signature(s) Signed: 02/18/2020 5:42:58 PM By: Carlene Coria RN Entered By: Carlene Coria on 02/14/2020 13:18:10

## 2020-02-22 ENCOUNTER — Encounter (HOSPITAL_BASED_OUTPATIENT_CLINIC_OR_DEPARTMENT_OTHER): Payer: Medicare Other | Admitting: Internal Medicine

## 2020-02-22 DIAGNOSIS — L97812 Non-pressure chronic ulcer of other part of right lower leg with fat layer exposed: Secondary | ICD-10-CM | POA: Diagnosis not present

## 2020-02-22 DIAGNOSIS — E039 Hypothyroidism, unspecified: Secondary | ICD-10-CM | POA: Diagnosis not present

## 2020-02-22 DIAGNOSIS — M3313 Other dermatomyositis without myopathy: Secondary | ICD-10-CM | POA: Diagnosis not present

## 2020-02-22 DIAGNOSIS — I10 Essential (primary) hypertension: Secondary | ICD-10-CM | POA: Diagnosis not present

## 2020-02-22 DIAGNOSIS — G4733 Obstructive sleep apnea (adult) (pediatric): Secondary | ICD-10-CM | POA: Diagnosis not present

## 2020-02-22 DIAGNOSIS — L97818 Non-pressure chronic ulcer of other part of right lower leg with other specified severity: Secondary | ICD-10-CM | POA: Diagnosis not present

## 2020-02-22 DIAGNOSIS — I890 Lymphedema, not elsewhere classified: Secondary | ICD-10-CM | POA: Diagnosis not present

## 2020-02-22 DIAGNOSIS — I872 Venous insufficiency (chronic) (peripheral): Secondary | ICD-10-CM | POA: Diagnosis not present
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
## 2020-02-25 NOTE — Progress Notes (Signed)
KATI, RIGGENBACH ([id number]) Visit Report for 02/22/2020 Arrival Information Details Patient Name: Date of Service: SHYE, DOTY [name] 02/22/2020 1:30 PM Medical Record Number: [id number] Patient Account Number: [id number] Date of Birth/Sex: Treating RN: 06-29-1941 (79 y.o. [name] Primary Care Montey Ebel: Carol Ada Other Clinician: Referring Vernice Bowker: Treating Darryel Diodato/Extender: Lona Millard in Treatment: 2 Visit Information History Since Last Visit Added or deleted any medications: No Patient Arrived: Gilford Rile Any new allergies or adverse reactions: No Arrival Time: 13:06 Had a fall or experienced change in No Accompanied By: alone activities of daily living that may affect Transfer Assistance: None risk of falls: Patient Identification Verified: Yes Signs or symptoms of abuse/neglect since last visito No Secondary Verification Process Completed: Yes Hospitalized since last visit: No Patient Requires Transmission-Based Precautions: No Implantable device outside of the clinic excluding No Patient Has Alerts: No cellular tissue based products placed in the center since last visit: Has Dressing in Place as Prescribed: Yes Pain Present Now: No Electronic Signature(s) Signed: 02/23/2020 6:13:34 PM By: Levan Hurst RN, BSN Entered By: Levan Hurst on 02/22/2020 13:06:19 -------------------------------------------------------------------------------- Encounter Discharge Information Details Patient Name: Date of Service: [name] [name] 02/22/2020 1:30 PM Medical Record Number: [id number] Patient Account Number: [id number] Date of Birth/Sex: Treating RN: 1940/11/16 (79 y.o. Clearnce Sorrel Primary Care Recie Cirrincione: Carol Ada Other Clinician: Referring Simmie Garin: Treating Braley Luckenbaugh/Extender: Lona Millard in Treatment: 2 Encounter Discharge Information Items Post Procedure Vitals Discharge Condition:  Stable Temperature (F): 97.9 Ambulatory Status: Walker Pulse (bpm): 86 Discharge Destination: Home Respiratory Rate (breaths/min): 18 Transportation: Other Blood Pressure (mmHg): 161/89 Accompanied By: self Schedule Follow-up Appointment: Yes Clinical Summary of Care: Patient Declined Electronic Signature(s) Signed: 02/25/2020 5:02:39 PM By: Kela Millin Entered By: Kela Millin on 02/22/2020 14:15:30 -------------------------------------------------------------------------------- Lower Extremity Assessment Details Patient Name: Date of Service: [name] [name] 02/22/2020 1:30 PM Medical Record Number: [id number] Patient Account Number: [id number] Date of Birth/Sex: Treating RN: 01/13/1941 (79 y.o. [name] Primary Care Logun Colavito: Carol Ada Other Clinician: Referring Larin Depaoli: Treating Ekam Besson/Extender: Stefanie Libel, Candace Weeks in Treatment: 2 Edema Assessment Assessed: [Left: No] [Right: No] Edema: [Left: Ye] [Right: s] Calf Left: Right: Point of Measurement: 48 cm From Medial Instep cm 49 cm Ankle Left: Right: Point of Measurement: 12 cm From Medial Instep cm 32 cm Vascular Assessment Pulses: Dorsalis Pedis Palpable: [Right:Yes] Electronic Signature(s) Signed: 02/23/2020 6:13:34 PM By: Levan Hurst RN, BSN Entered By: Levan Hurst on 02/22/2020 13:11:43 -------------------------------------------------------------------------------- Multi Wound Chart Details Patient Name: Date of Service: [name] [name] 02/22/2020 1:30 PM Medical Record Number: [id number] Patient Account Number: [id number] Date of Birth/Sex: Treating RN: 04-May-1941 (79 y.o. Orvan Falconer Primary Care Mariadelcarmen Corella: Carol Ada Other Clinician: Referring Emmarae Cowdery: Treating Chariah Bailey/Extender: Stefanie Libel, Candace Weeks in Treatment: 2 Vital Signs Height(in): 64 Pulse(bpm): 86 Weight(lbs): 75 Blood Pressure(mmHg): 161/89 Body Mass  Index(BMI): 45 Temperature(F): 97.9 Respiratory Rate(breaths/min): 18 Photos: [1:No Photos Right, Anterior Lower Leg] [N/A:N/A N/A] Wound Location: [1:Gradually Appeared] [N/A:N/A] Wounding Event: [1:Venous Leg Ulcer] [N/A:N/A] Primary Etiology: [1:Sleep Apnea] [N/A:N/A] Comorbid History: [1:07/30/2019] [N/A:N/A] Date Acquired: [1:2] [N/A:N/A] Weeks of Treatment: [1:Open] [N/A:N/A] Wound Status: [1:2.1x3.2x0.2] [N/A:N/A] Measurements L x W x D (cm) [1:5.278] [N/A:N/A] A (cm) : rea [1:1.056] [N/A:N/A] Volume (cm) : [1:-24.50%] [N/A:N/A] % Reduction in Area: [1:-149.10%] [N/A:N/A] % Reduction in Volume: [1:Full Thickness Without Exposed] [N/A:N/A] Classification: [1:Support Structures Medium] [N/A:N/A] Exudate A mount: [1:Serosanguineous] [N/A:N/A] Exudate Type: [1:red, brown] [N/A:N/A] Exudate Color: [1:Flat and Intact] [  N/A:N/A] Wound Margin: [1:Small (1-33%)] [N/A:N/A] Granulation A mount: [1:Pink] [N/A:N/A] Granulation Quality: [1:Large (67-100%)] [N/A:N/A] Necrotic A mount: [1:Fat Layer (Subcutaneous Tissue)] [N/A:N/A] Exposed Structures: [1:Exposed: Yes Fascia: No Tendon: No Muscle: No Joint: No Bone: No Small (1-33%)] [N/A:N/A] Epithelialization: [1:Debridement - Excisional] [N/A:N/A] Debridement: Pre-procedure Verification/Time Out 13:28 [N/A:N/A] Taken: [1:Lidocaine 5% topical ointment] [N/A:N/A] Pain Control: [1:Subcutaneous, Slough] [N/A:N/A] Tissue Debrided: [1:Skin/Subcutaneous Tissue] [N/A:N/A] Level: [1:6.72] [N/A:N/A] Debridement A (sq cm): [1:rea Curette] [N/A:N/A] Instrument: [1:Moderate] [N/A:N/A] Bleeding: [1:Pressure] [N/A:N/A] Hemostasis A chieved: [1:0] [N/A:N/A] Procedural Pain: [1:0] [N/A:N/A] Post Procedural Pain: [1:Procedure was tolerated well] [N/A:N/A] Debridement Treatment Response: [1:2.1x3.2x0.2] [N/A:N/A] Post Debridement Measurements L x W x D (cm) [1:1.056] [N/A:N/A] Post Debridement Volume: (cm) [1:Debridement] [N/A:N/A] Treatment  Notes Electronic Signature(s) Signed: 02/23/2020 3:52:22 AM By: Linton Ham MD Signed: 02/23/2020 6:04:59 PM By: Carlene Coria RN Entered By: Linton Ham on 02/22/2020 13:57:03 -------------------------------------------------------------------------------- Multi-Disciplinary Care Plan Details Patient Name: Date of Service: [name] [name] 02/22/2020 1:30 PM Medical Record Number: [id number] Patient Account Number: [id number] Date of Birth/Sex: Treating RN: [birth year] (79 y.o. Orvan Falconer Primary Care Reyann Troop: Carol Ada Other Clinician: Referring Teon Hudnall: Treating Mariacristina Aday/Extender: Stefanie Libel, Lenor Derrick in Treatment: 2 Active Inactive Abuse / Safety / Falls / Self Care Management Nursing Diagnoses: Potential for falls Potential for injury related to falls Goals: Patient will not experience any injury related to falls Date Initiated: 02/07/2020 Target Resolution Date: 03/17/2020 Goal Status: Active Patient/caregiver will verbalize/demonstrate measures taken to prevent injury and/or falls Date Initiated: 02/07/2020 Target Resolution Date: 03/17/2020 Goal Status: Active Interventions: Assess Activities of Daily Living upon admission and as needed Assess fall risk on admission and as needed Assess: immobility, friction, shearing, incontinence upon admission and as needed Assess impairment of mobility on admission and as needed per policy Assess personal safety and home safety (as indicated) on admission and as needed Assess self care needs on admission and as needed Provide education on fall prevention Provide education on personal and home safety Notes: Venous Leg Ulcer Nursing Diagnoses: Actual venous Insuffiency (use after diagnosis is confirmed) Knowledge deficit related to disease process and management Goals: Patient will maintain optimal edema control Date Initiated: 02/07/2020 Target Resolution Date: 03/17/2020 Goal Status:  Active Patient/caregiver will verbalize understanding of disease process and disease management Date Initiated: 02/07/2020 Target Resolution Date: 03/17/2020 Goal Status: Active Interventions: Assess peripheral edema status every visit. Compression as ordered Provide education on venous insufficiency Notes: Wound/Skin Impairment Nursing Diagnoses: Impaired tissue integrity Knowledge deficit related to ulceration/compromised skin integrity Goals: Patient/caregiver will verbalize understanding of skin care regimen Date Initiated: 02/07/2020 Target Resolution Date: 03/17/2020 Goal Status: Active Ulcer/skin breakdown will have a volume reduction of 30% by week 4 Date Initiated: 02/07/2020 Target Resolution Date: 03/17/2020 Goal Status: Active Interventions: Assess patient/caregiver ability to obtain necessary supplies Assess patient/caregiver ability to perform ulcer/skin care regimen upon admission and as needed Assess ulceration(s) every visit Provide education on ulcer and skin care Notes: Electronic Signature(s) Signed: 02/23/2020 6:04:59 PM By: Carlene Coria RN Entered By: Carlene Coria on 02/22/2020 13:26:56 -------------------------------------------------------------------------------- Pain Assessment Details Patient Name: Date of Service: MIKAH, ROTTINGHAUS [name] 02/22/2020 1:30 PM Medical Record Number: [id number] Patient Account Number: [id number] Date of Birth/Sex: Treating RN: October 29, 1940 (79 y.o. [name] Primary Care Lashann Hagg: Carol Ada Other Clinician: Referring Estefanie Cornforth: Treating Ladaja Yusupov/Extender: Lona Millard in Treatment: 2 Active Problems Location of Pain Severity and Description of Pain Patient Has Paino No Site Locations Pain Management and Medication Current Pain Management:  Electronic Signature(s) Signed: 02/23/2020 6:13:34 PM By: Levan Hurst RN, BSN Entered By: Levan Hurst on 02/22/2020  13:07:00 -------------------------------------------------------------------------------- Patient/Caregiver Education Details Patient Name: Date of Service: [name] NICE Jerilynn Mages 7/27/2021andnbsp1:30 PM Medical Record Number: [id number] Patient Account Number: [id number] Date of Birth/Gender: Treating RN: 1940-11-23 (79 y.o. Orvan Falconer Primary Care Physician: Carol Ada Other Clinician: Referring Physician: Treating Physician/Extender: Lona Millard in Treatment: 2 Education Assessment Education Provided To: Patient Education Topics Provided Wound/Skin Impairment: Methods: Explain/Verbal Responses: State content correctly Electronic Signature(s) Signed: 02/23/2020 6:04:59 PM By: Carlene Coria RN Entered By: Carlene Coria on 02/22/2020 13:27:10 -------------------------------------------------------------------------------- Wound Assessment Details Patient Name: Date of Service: BERNARD, DONAHOO [name] 02/22/2020 1:30 PM Medical Record Number: [id number] Patient Account Number: [id number] Date of Birth/Sex: Treating RN: 09/22/1940 (79 y.o. [name] Primary Care Lauralye Kinn: Carol Ada Other Clinician: Referring Uldine Fuster: Treating Mortimer Bair/Extender: Stefanie Libel, Candace Weeks in Treatment: 2 Wound Status Wound Number: 1 Primary Etiology: Venous Leg Ulcer Wound Location: Right, Anterior Lower Leg Wound Status: Open Wounding Event: Gradually Appeared Comorbid History: Sleep Apnea Date Acquired: 07/30/2019 Weeks Of Treatment: 2 Clustered Wound: No Photos Photo Uploaded By: Mikeal Hawthorne on 02/23/2020 13:10:43 Wound Measurements Length: (cm) 2.1 Width: (cm) 3.2 Depth: (cm) 0.2 Area: (cm) 5.278 Volume: (cm) 1.056 % Reduction in Area: -24.5% % Reduction in Volume: -149.1% Epithelialization: Small (1-33%) Tunneling: No Undermining: No Wound Description Classification: Full Thickness Without Exposed Support Structures Wound  Margin: Flat and Intact Exudate Amount: Medium Exudate Type: Serosanguineous Exudate Color: red, brown Foul Odor After Cleansing: No Slough/Fibrino Yes Wound Bed Granulation Amount: Small (1-33%) Exposed Structure Granulation Quality: Pink Fascia Exposed: No Necrotic Amount: Large (67-100%) Fat Layer (Subcutaneous Tissue) Exposed: Yes Necrotic Quality: Adherent Slough Tendon Exposed: No Muscle Exposed: No Joint Exposed: No Bone Exposed: No Treatment Notes Wound #1 (Right, Anterior Lower Leg) 1. Cleanse With Wound Cleanser 2. Periwound Care Skin Prep 3. Primary Dressing Applied Iodoflex 4. Secondary Dressing ABD Pad Dry Gauze 5. Secured With Recruitment consultant) Signed: 02/23/2020 6:13:34 PM By: Levan Hurst RN, BSN Entered By: Levan Hurst on 02/22/2020 13:12:23 -------------------------------------------------------------------------------- Vitals Details Patient Name: Date of Service: [name] [name] 02/22/2020 1:30 PM Medical Record Number: [id number] Patient Account Number: [id number] Date of Birth/Sex: Treating RN: [birth year] (79 y.o. [name] Primary Care Emory Gallentine: Carol Ada Other Clinician: Referring Fanny Agan: Treating Terresa Marlett/Extender: Stefanie Libel, Lenor Derrick in Treatment: 2 Vital Signs Time Taken: 13:06 Temperature (F): 97.9 Height (in): 64 Pulse (bpm): 86 Weight (lbs): 262 Respiratory Rate (breaths/min): 18 Body Mass Index (BMI): 45 Blood Pressure (mmHg): 161/89 Reference Range: 80 - 120 mg / dl Electronic Signature(s) Signed: 02/23/2020 6:13:34 PM By: Levan Hurst RN, BSN Entered By: Levan Hurst on 02/22/2020 13:06:38

## 2020-03-07 ENCOUNTER — Encounter (HOSPITAL_BASED_OUTPATIENT_CLINIC_OR_DEPARTMENT_OTHER): Payer: Medicare Other | Attending: Internal Medicine | Admitting: Internal Medicine

## 2020-03-07 DIAGNOSIS — M3312 Other dermatopolymyositis with myopathy: Secondary | ICD-10-CM | POA: Diagnosis not present

## 2020-03-07 DIAGNOSIS — L97812 Non-pressure chronic ulcer of other part of right lower leg with fat layer exposed: Secondary | ICD-10-CM | POA: Diagnosis not present

## 2020-03-07 DIAGNOSIS — M3313 Other dermatomyositis without myopathy: Secondary | ICD-10-CM | POA: Insufficient documentation

## 2020-03-07 DIAGNOSIS — I890 Lymphedema, not elsewhere classified: Secondary | ICD-10-CM | POA: Diagnosis not present

## 2020-03-07 DIAGNOSIS — G4733 Obstructive sleep apnea (adult) (pediatric): Secondary | ICD-10-CM | POA: Insufficient documentation

## 2020-03-07 DIAGNOSIS — I10 Essential (primary) hypertension: Secondary | ICD-10-CM | POA: Insufficient documentation

## 2020-03-07 DIAGNOSIS — L97818 Non-pressure chronic ulcer of other part of right lower leg with other specified severity: Secondary | ICD-10-CM | POA: Insufficient documentation

## 2020-03-07 NOTE — Progress Notes (Signed)
HEMA, LANZA ([id number]) Visit Report for 03/07/2020 HPI Details Patient Name: Date of Service: Janice [name], Janice [name] 03/07/2020 1:30 PM Medical Record Number: [id number] Patient Account Number: [id number] Date of Birth/Sex: Treating RN: 11-Sep-1940 (79 y.o. Janice [name] Primary Care Provider: Carol Ada Other Clinician: Referring Provider: Treating Provider/Extender: Laurence Spates Weeks in Treatment: 4 History of Present Illness HPI Description: ADMISSION 02/07/2020 This is an 79 year old woman who is transferring from our sister clinic in [place] after their closure. She has been treated by Dr. Jerilee Hoh in the clinic there since the fall 2018. She was healed in August 2019 but readmitted to the clinic in January 2021. She has compression pumps that were apparently arranged for her by Dr. Sharol Given in the past she uses them once a day although she has not been able to do this recently subsequent to a death of a close friend. 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 Her particular condition is extremely painful to the touch therefore she cannot tolerate stockings or compression wraps although she has not had 1 of these recently. They have been using calcium alginate on the wound. Recently a culture was done and she completed a course of ciprofloxacin. She thinks the wound has been gradually contracting. 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 She appears to be walking with a walker. What drives with hand controllers. She has a custom shoe with an AFO brace on the right leg 7/19; patient was using twice a day  compression pumps up until 2 days ago she had some personal issues and then was scared by using the lightening. She is dressing this is self with Iodosorb/Iodoflex. She is intolerant of compression wraps 7/27; wound exam; right anterior lower tibia. She does not allow compression but I did not manage to get her to agree to use her external compression pumps twice a day. She is using Iodoflex on the wound 8/10-Patient returns at 2 weeks, we have been using Iodoflex on the wound to the right anterior tibia, patient has more pain now than before and has not been using the lymphedema pumps Electronic Signature(s) Signed: 03/07/2020 1:43:37 PM By: Tobi Bastos MD, MBA Entered By: Tobi Bastos on 03/07/2020 13:43:37 -------------------------------------------------------------------------------- Physical Exam Details Patient Name: Date of Service: Janice Fordyce [name] 03/07/2020 1:30 PM Medical Record Number: [id number] Patient Account Number: [id number] Date of Birth/Sex: Treating RN: 07/03/1941 (79 y.o. Janice [name] Primary Care Provider: Carol Ada Other Clinician: Referring Provider: Treating Provider/Extender: Laurence Spates Weeks in Treatment: 4 Constitutional alert and oriented x 3. sitting or standing blood pressure is within target range for patient.. supine blood pressure is within target range for patient.. pulse regular and within target range for patient.Marland Kitchen respirations regular, non-labored and within target range for patient.Marland Kitchen temperature within target range for patient.. . . Well- nourished and well-hydrated in no acute distress. Notes Right anterior leg wound with mild degree of slough, surrounding skin with very mild redness Electronic Signature(s) Signed: 03/07/2020 1:44:12 PM By: Tobi Bastos MD, MBA Entered By: Tobi Bastos on 03/07/2020 13:44:12 -------------------------------------------------------------------------------- Physician Orders  Details Patient Name: Date of Service: Janice Fordyce [name] 03/07/2020 1:30 PM Medical Record Number: [id number] Patient Account  Number: [id number] Date of Birth/Sex: Treating RN: [birth year] (79 y.o. 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 Dressing Change Frequency Wound #1 Right,Anterior Lower Leg Change Dressing every other day. Wound Cleansing Wound #1 Right,Anterior Lower Leg May shower and wash wound with soap and water. - on days that dressing is changed Primary Wound Dressing Wound #1 Right,Anterior Lower Leg Hydrofera Blue - classic Secondary Dressing Wound #1 Right,Anterior Lower Leg Other: - ABD pad and paper tape Edema Control Avoid standing for long periods of time Elevate legs to the level of the heart or above for 30 minutes daily and/or when sitting, a frequency of: - throughout the day Exercise regularly Segmental Compressive Device. - Lymphedema pumps twice a day for 1 hour each time Electronic Signature(s) Signed: 03/07/2020 4:52:06 PM By: Tobi Bastos MD, MBA Signed: 03/07/2020 5:05:11 PM By: Carlene Coria RN Entered By: Carlene Coria on 03/07/2020 13:42:10 -------------------------------------------------------------------------------- Problem List Details Patient Name: Date of Service: Janice Fordyce [name] 03/07/2020 1:30 PM Medical Record Number: [id number] Patient Account Number: [id number] Date of Birth/Sex: Treating RN: 1940/09/22 (79 y.o. 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 03/07/2020 1:30 PM Medical Record Number: [id number] Patient Account Number: [id number] Date of Birth/Sex: Treating RN: 01/15/1941 (79 y.o. Janice [name] Primary Care Provider: Carol Ada Other Clinician: Referring Provider: Treating Provider/Extender: Laurence Spates Weeks in Treatment: 4 Subjective History of Present Illness (HPI) ADMISSION 02/07/2020 This is an 79 year old woman who is transferring from our sister clinic in [place] after their closure. She has been treated by Dr. Jerilee Hoh in the clinic there since the fall 2018. She was healed in August 2019 but readmitted to the clinic in January 2021. She has compression pumps that were apparently arranged for her by Dr. Sharol Given in the past she uses them once a day although she has not been able to do this recently subsequent to a death of a close friend. 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 Her particular condition is extremely painful to the touch therefore she cannot tolerate stockings or  compression wraps although she has not had 1 of these recently. They have been using calcium alginate on the wound. Recently a culture was done and she completed a course of ciprofloxacin. She thinks the wound has been gradually contracting. 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 She appears to be walking with a walker. What drives with hand controllers. She has a custom shoe with an AFO brace on the right leg 7/19; patient was using twice a day compression pumps up until 2 days ago she had some personal issues and then was scared by using the lightening. She is dressing this is self with Iodosorb/Iodoflex. She is intolerant of compression wraps 7/27; wound exam; right anterior lower tibia. She does not allow compression but I did not manage to get her to agree to use her external compression pumps twice a day. She is using Iodoflex on the wound 8/10-Patient returns at 2 weeks, we have been using Iodoflex on the wound to the right anterior tibia, patient has more pain now than before and has not been using the lymphedema pumps Objective Constitutional alert and oriented x 3. sitting or standing blood pressure is within target range for patient.. supine blood pressure is within target range for patient.. pulse regular and within target range for patient.Marland Kitchen respirations regular, non-labored and within target range for patient.Marland Kitchen temperature within target range for patient.. Well- nourished and well-hydrated in no acute distress. Vitals Time Taken: 12:55 PM, Height: 64 in, Weight: 262 lbs, BMI: 45, Temperature: 98.7 F, Pulse: 104 bpm, Respiratory Rate: 18 breaths/min, Blood Pressure: 137/68 mmHg. General Notes: Right anterior leg wound with mild degree of slough,  surrounding skin with very mild redness Integumentary (Hair, Skin) Wound #1 status is Open. Original cause of wound was Gradually Appeared. The wound is located on the Right,Anterior Lower Leg. The wound measures 2.2cm length x 3.3cm width x 0.2cm depth; 5.702cm^2 area and 1.14cm^3 volume. There is Fat Layer (Subcutaneous Tissue) Exposed exposed. There is no tunneling or undermining noted. There is a medium amount of serosanguineous drainage noted. The wound margin is flat and intact. There is small (1-33%) pink granulation within the wound bed. There is a large (67-100%) amount of necrotic tissue within the wound bed including Adherent Slough. 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 Dressing Change Frequency: Wound #1 Right,Anterior Lower Leg: Change Dressing every other day. Wound Cleansing: Wound #1 Right,Anterior Lower Leg: May shower and wash wound with soap and water. - on days that dressing is changed Primary Wound Dressing: Wound #1 Right,Anterior Lower Leg: Hydrofera Blue - classic Secondary Dressing: Wound #1 Right,Anterior Lower Leg: Other: - ABD pad and paper tape Edema Control: Avoid standing for long periods of time Elevate legs to the level of the heart or above for 30 minutes daily and/or when sitting, a frequency of: - throughout the day Exercise regularly Segmental Compressive Device. - Lymphedema pumps twice a day for 1 hour each time -Switch over to [facility] to see if this makes a difference she has been using Iodoflex for about a month -Keep leg elevated -Hopefully if pain levels reduced she can go back to using her lymphedema pumps -Return in 2 weeks Electronic Signature(s) Signed: 03/07/2020 1:45:06 PM By: Tobi Bastos MD, MBA Entered  By: Tobi Bastos on 03/07/2020 13:45:06 -------------------------------------------------------------------------------- SuperBill Details Patient Name: Date of Service: Janice Fordyce [name] 03/07/2020 Medical Record Number: [id number] Patient Account Number: [id number] Date of Birth/Sex: Treating RN: [birth year] (79 y.o. 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

## 2020-03-10 DIAGNOSIS — E038 Other specified hypothyroidism: Secondary | ICD-10-CM | POA: Diagnosis not present

## 2020-03-10 DIAGNOSIS — S81802D Unspecified open wound, left lower leg, subsequent encounter: Secondary | ICD-10-CM | POA: Diagnosis not present

## 2020-03-10 DIAGNOSIS — M48061 Spinal stenosis, lumbar region without neurogenic claudication: Secondary | ICD-10-CM | POA: Diagnosis not present

## 2020-03-10 DIAGNOSIS — M62838 Other muscle spasm: Secondary | ICD-10-CM | POA: Diagnosis not present

## 2020-03-10 DIAGNOSIS — M150 Primary generalized (osteo)arthritis: Secondary | ICD-10-CM | POA: Diagnosis not present

## 2020-03-10 DIAGNOSIS — M3390 Dermatopolymyositis, unspecified, organ involvement unspecified: Secondary | ICD-10-CM | POA: Diagnosis not present

## 2020-03-10 NOTE — Progress Notes (Signed)
Janice [name], Janice [name] ([id number]) Visit Report for 03/07/2020 Arrival Information Details Patient Name: Date of Service: VALLEY, KE [name] 03/07/2020 1:30 PM Medical Record Number: [id number] Patient Account Number: [id number] Date of Birth/Sex: Treating RN: August 30, 1940 (79 y.o. Janice [name] Primary Care Joniqua Sidle: Carol Ada Other Clinician: Referring Analycia Khokhar: Treating Jamir Rone/Extender: Laurence Spates Weeks in Treatment: 4 Visit Information History Since Last Visit Added or deleted any medications: No Patient Arrived: Gilford Rile Any new allergies or adverse reactions: No Arrival Time: 12:46 Had a fall or experienced change in No Accompanied By: alone activities of daily living that may affect Transfer Assistance: None risk of falls: Patient Identification Verified: Yes Signs or symptoms of abuse/neglect since last visito No Secondary Verification Process Completed: Yes Hospitalized since last visit: No Patient Requires Transmission-Based Precautions: No Implantable device outside of the clinic excluding No Patient Has Alerts: No cellular tissue based products placed in the center since last visit: Has Dressing in Place as Prescribed: Yes Pain Present Now: No Electronic Signature(s) Signed: 03/10/2020 5:19:54 PM By: Levan Hurst RN, BSN Entered By: Levan Hurst on 03/07/2020 12:50:16 -------------------------------------------------------------------------------- Encounter Discharge Information Details Patient Name: Date of Service: Janice [name] [name] 03/07/2020 1:30 PM Medical Record Number: [id number] Patient Account Number: [id number] Date of Birth/Sex: Treating RN: 09-05-1940 (79 y.o. Janice [name] Primary Care Dellamae Rosamilia: Carol Ada Other Clinician: Referring Adessa Primiano: Treating Solace Wendorff/Extender: Laurence Spates Weeks in Treatment: 4 Encounter Discharge Information Items Discharge Condition: Stable Ambulatory Status:  Walker Discharge Destination: Home Transportation: Other Accompanied By: self Schedule Follow-up Appointment: Yes Clinical Summary of Care: Patient Declined Electronic Signature(s) Signed: 03/07/2020 5:12:21 PM By: Kela Millin Entered By: Kela Millin on 03/07/2020 13:56:27 -------------------------------------------------------------------------------- Lower Extremity Assessment Details Patient Name: Date of Service: Janice [name] [name] 03/07/2020 1:30 PM Medical Record Number: [id number] Patient Account Number: [id number] Date of Birth/Sex: Treating RN: [birth year] (79 y.o. Janice [name] Primary Care Natnael Biederman: Carol Ada Other Clinician: Referring Arelie Kuzel: Treating Amyria Komar/Extender: Laurence Spates Weeks in Treatment: 4 Edema Assessment Assessed: [Left: No] [Right: No] Edema: [Left: Ye] [Right: s] Calf Left: Right: Point of Measurement: 48 cm From Medial Instep cm 48.5 cm Ankle Left: Right: Point of Measurement: 12 cm From Medial Instep cm 32 cm Vascular Assessment Pulses: Dorsalis Pedis Palpable: [Right:Yes] Electronic Signature(s) Signed: 03/10/2020 5:19:54 PM By: Levan Hurst RN, BSN Entered By: Levan Hurst on 03/07/2020 12:59:08 -------------------------------------------------------------------------------- [place] Details Patient Name: Date of Service: Janice [name] [name] 03/07/2020 1:30 PM Medical Record Number: [id number] Patient Account Number: [id number] Date of Birth/Sex: Treating RN: 1940/10/01 (79 y.o. Janice [name] Primary Care Murry Diaz: Carol Ada Other Clinician: Referring Jiovanni Heeter: Treating Tonae Livolsi/Extender: Laurence Spates Weeks in Treatment: 4 Active Inactive Abuse / Safety / Falls / Self Care Management Nursing Diagnoses: Potential for falls Potential for injury related to falls Goals: Patient will not experience any injury related to falls Date Initiated:  02/07/2020 Target Resolution Date: 03/17/2020 Goal Status: Active Patient/caregiver will verbalize/demonstrate measures taken to prevent injury and/or falls Date Initiated: 02/07/2020 Target Resolution Date: 03/17/2020 Goal Status: Active Interventions: Assess Activities of Daily Living upon admission and as needed Assess fall risk on admission and as needed Assess: immobility, friction, shearing, incontinence upon admission and as needed Assess impairment of mobility on admission and as needed per policy Assess personal safety and home safety (as indicated) on admission and as needed Assess self care needs on admission and as needed Provide education on fall prevention Provide education on personal and  home safety Notes: Venous Leg Ulcer Nursing Diagnoses: Actual venous Insuffiency (use after diagnosis is confirmed) Knowledge deficit related to disease process and management Goals: Patient will maintain optimal edema control Date Initiated: 02/07/2020 Target Resolution Date: 03/17/2020 Goal Status: Active Patient/caregiver will verbalize understanding of disease process and disease management Date Initiated: 02/07/2020 Target Resolution Date: 03/17/2020 Goal Status: Active Interventions: Assess peripheral edema status every visit. Compression as ordered Provide education on venous insufficiency Notes: Wound/Skin Impairment Nursing Diagnoses: Impaired tissue integrity Knowledge deficit related to ulceration/compromised skin integrity Goals: Patient/caregiver will verbalize understanding of skin care regimen Date Initiated: 02/07/2020 Target Resolution Date: 03/17/2020 Goal Status: Active Ulcer/skin breakdown will have a volume reduction of 30% by week 4 Date Initiated: 02/07/2020 Target Resolution Date: 03/17/2020 Goal Status: Active Interventions: Assess patient/caregiver ability to obtain necessary supplies Assess patient/caregiver ability to perform ulcer/skin care regimen  upon admission and as needed Assess ulceration(s) every visit Provide education on ulcer and skin care Notes: Electronic Signature(s) Signed: 03/07/2020 5:05:11 PM By: Carlene Coria RN Entered By: Carlene Coria on 03/07/2020 12:48:55 -------------------------------------------------------------------------------- Pain Assessment Details Patient Name: Date of Service: Janice [name] [name] 03/07/2020 1:30 PM Medical Record Number: [id number] Patient Account Number: [id number] Date of Birth/Sex: Treating RN: [birth year] (79 y.o. Janice [name] Primary Care Elica Almas: Carol Ada Other Clinician: Referring Hikari Tripp: Treating Gracee Ratterree/Extender: Laurence Spates Weeks in Treatment: 4 Active Problems Location of Pain Severity and Description of Pain Patient Has Paino Yes Site Locations Pain Location: Pain in Ulcers With Dressing Change: Yes Rate the pain. Current Pain Level: 4 Pain Management and Medication Current Pain Management: Medication: Yes Cold Application: No Rest: No Massage: No Activity: No T.E.N.S.: No Heat Application: No Leg drop or elevation: No Is the Current Pain Management Adequate: Adequate How does your wound impact your activities of daily livingo Sleep: No Bathing: No Appetite: No Relationship With Others: No Bladder Continence: No Emotions: No Bowel Continence: No Work: No Toileting: No Drive: No Dressing: No Hobbies: No Electronic Signature(s) Signed: 03/10/2020 5:19:54 PM By: Levan Hurst RN, BSN Entered By: Levan Hurst on 03/07/2020 12:56:32 -------------------------------------------------------------------------------- Patient/Caregiver Education Details Patient Name: Date of Service: Janice [name] NICE Jerilynn Mages 8/10/2021andnbsp1:30 PM Medical Record Number: [id number] Patient Account Number: [id number] Date of Birth/Gender: Treating RN: Dec 22, 1940 (79 y.o. Janice [name] Primary Care Physician: Carol Ada Other  Clinician: Referring Physician: Treating Physician/Extender: Melvyn Novas in Treatment: 4 Education Assessment Education Provided To: Patient Education Topics Provided Wound/Skin Impairment: Methods: Explain/Verbal Responses: State content correctly Electronic Signature(s) Signed: 03/07/2020 5:05:11 PM By: Carlene Coria RN Entered By: Carlene Coria on 03/07/2020 12:49:14 -------------------------------------------------------------------------------- Wound Assessment Details Patient Name: Date of Service: Janice [name] [name] 03/07/2020 1:30 PM Medical Record Number: [id number] Patient Account Number: [id number] Date of Birth/Sex: Treating RN: 01-26-1941 (79 y.o. Janice [name] Primary Care Jenniferlynn Saad: Carol Ada Other Clinician: Referring Elenna Spratling: Treating Henriette Hesser/Extender: Gaye Pollack, Candace Weeks in Treatment: 4 Wound Status Wound Number: 1 Primary Etiology: Venous Leg Ulcer Wound Location: Right, Anterior Lower Leg Wound Status: Open Wounding Event: Gradually Appeared Comorbid History: Sleep Apnea Date Acquired: 07/30/2019 Weeks Of Treatment: 4 Clustered Wound: No Photos Photo Uploaded By: Mikeal Hawthorne on 03/08/2020 10:40:38 Wound Measurements Length: (cm) 2.2 Width: (cm) 3.3 Depth: (cm) 0.2 Area: (cm) 5.702 Volume: (cm) 1.14 % Reduction in Area: -34.4% % Reduction in Volume: -168.9% Epithelialization: Small (1-33%) Tunneling: No Undermining: No Wound Description Classification: Full Thickness Without Exposed Support Structures Wound Margin: Flat and Intact Exudate Amount: Medium Exudate  Type: Serosanguineous Exudate Color: red, brown Foul Odor After Cleansing: No Slough/Fibrino Yes Wound Bed Granulation Amount: Small (1-33%) Exposed Structure Granulation Quality: Pink Fascia Exposed: No Necrotic Amount: Large (67-100%) Fat Layer (Subcutaneous Tissue) Exposed: Yes Necrotic Quality: Adherent Slough Tendon  Exposed: No Muscle Exposed: No Joint Exposed: No Bone Exposed: No Treatment Notes Wound #1 (Right, Anterior Lower Leg) 1. Cleanse With Wound Cleanser 3. Primary Dressing Applied Hydrofera Blue 4. Secondary Dressing ABD Pad 5. Secured With Recruitment consultant) Signed: 03/10/2020 5:19:54 PM By: Levan Hurst RN, BSN Entered By: Levan Hurst on 03/07/2020 13:03:43 -------------------------------------------------------------------------------- [place] Details Patient Name: Date of Service: Janice [name] [name] 03/07/2020 1:30 PM Medical Record Number: [id number] Patient Account Number: [id number] Date of Birth/Sex: Treating RN: February 22, 1941 (79 y.o. Janice [name] Primary Care Rasul Decola: Carol Ada Other Clinician: Referring Maily Debarge: Treating Ygnacio Fecteau/Extender: Laurence Spates Weeks in Treatment: 4 Vital Signs Time Taken: 12:55 Temperature (F): 98.7 Height (in): 64 Pulse (bpm): 104 Weight (lbs): 262 Respiratory Rate (breaths/min): 18 Body Mass Index (BMI): 45 Blood Pressure (mmHg): 137/68 Reference Range: 80 - 120 mg / dl Electronic Signature(s) Signed: 03/10/2020 5:19:54 PM By: Levan Hurst RN, BSN Entered By: Levan Hurst on 03/07/2020 12:55:55

## 2020-03-16 DIAGNOSIS — Z23 Encounter for immunization: Secondary | ICD-10-CM | POA: Diagnosis not present

## 2020-03-21 ENCOUNTER — Encounter (HOSPITAL_BASED_OUTPATIENT_CLINIC_OR_DEPARTMENT_OTHER): Payer: Medicare Other | Admitting: Internal Medicine

## 2020-03-28 ENCOUNTER — Encounter (HOSPITAL_BASED_OUTPATIENT_CLINIC_OR_DEPARTMENT_OTHER): Payer: Medicare Other | Admitting: Internal Medicine

## 2020-03-28 DIAGNOSIS — M3313 Other dermatomyositis without myopathy: Secondary | ICD-10-CM | POA: Diagnosis not present

## 2020-03-28 DIAGNOSIS — I872 Venous insufficiency (chronic) (peripheral): Secondary | ICD-10-CM | POA: Diagnosis not present

## 2020-03-28 DIAGNOSIS — L97818 Non-pressure chronic ulcer of other part of right lower leg with other specified severity: Secondary | ICD-10-CM | POA: Diagnosis not present

## 2020-03-28 DIAGNOSIS — I890 Lymphedema, not elsewhere classified: Secondary | ICD-10-CM | POA: Diagnosis not present

## 2020-03-28 DIAGNOSIS — G4733 Obstructive sleep apnea (adult) (pediatric): Secondary | ICD-10-CM | POA: Diagnosis not present

## 2020-03-28 DIAGNOSIS — I10 Essential (primary) hypertension: Secondary | ICD-10-CM | POA: Diagnosis not present

## 2020-03-28 DIAGNOSIS — L97812 Non-pressure chronic ulcer of other part of right lower leg with fat layer exposed: Secondary | ICD-10-CM | POA: Diagnosis not present

## 2020-03-29 NOTE — Progress Notes (Signed)
SHAYNAH, HUND ([id number]) Visit Report for 03/28/2020 Arrival Information Details Patient Name: Date of Service: FABLE, HUISMAN [name] 03/28/2020 1:30 PM Medical Record Number: [id number] Patient Account Number: [id number] Date of Birth/Sex: Treating RN: 23-Apr-1941 (79 y.o. Martyn Malay, Linda Primary Care Breven Guidroz: Carol Ada Other Clinician: Referring Brittanie Dosanjh: Treating Robbyn Hodkinson/Extender: Lona Millard in Treatment: 7 Visit Information History Since Last Visit Added or deleted any medications: No Patient Arrived: Gilford Rile Any new allergies or adverse reactions: No Arrival Time: 13:46 Had a fall or experienced change in No Accompanied By: self activities of daily living that may affect Transfer Assistance: None risk of falls: Patient Identification Verified: Yes Signs or symptoms of abuse/neglect since last visito No Secondary Verification Process Completed: Yes Hospitalized since last visit: No Patient Requires Transmission-Based Precautions: No Implantable device outside of the clinic excluding No Patient Has Alerts: No cellular tissue based products placed in the center since last visit: Has Dressing in Place as Prescribed: Yes Pain Present Now: No Electronic Signature(s) Signed: 03/28/2020 4:57:23 PM By: Baruch Gouty RN, BSN Entered By: Baruch Gouty on 03/28/2020 13:49:24 -------------------------------------------------------------------------------- Encounter Discharge Information Details Patient Name: Date of Service: [name] [name] 03/28/2020 1:30 PM Medical Record Number: [id number] Patient Account Number: [id number] Date of Birth/Sex: Treating RN: [birth year] (79 y.o. Clearnce Sorrel Primary Care Azaliah Carrero: Carol Ada Other Clinician: Referring Len Azeez: Treating Agripina Guyette/Extender: Lona Millard in Treatment: 7 Encounter Discharge Information Items Post Procedure Vitals Discharge Condition:  Stable Temperature (F): 98.6 Ambulatory Status: Walker Pulse (bpm): 84 Discharge Destination: Home Respiratory Rate (breaths/min): 18 Transportation: Other Blood Pressure (mmHg): 171/97 Accompanied By: self Schedule Follow-up Appointment: Yes Clinical Summary of Care: Patient Declined Electronic Signature(s) Signed: 03/28/2020 5:00:49 PM By: Kela Millin Entered By: Kela Millin on 03/28/2020 16:34:31 -------------------------------------------------------------------------------- Lower Extremity Assessment Details Patient Name: Date of Service: [name] [name] 03/28/2020 1:30 PM Medical Record Number: [id number] Patient Account Number: [id number] Date of Birth/Sex: Treating RN: [birth year] (79 y.o. Elam Dutch Primary Care Aslynn Brunetti: Carol Ada Other Clinician: Referring Julieann Drummonds: Treating Demetries Coia/Extender: Stefanie Libel, Candace Weeks in Treatment: 7 Edema Assessment Assessed: [Left: No] [Right: No] Edema: [Left: Ye] [Right: s] Calf Left: Right: Point of Measurement: 48 cm From Medial Instep cm 48.2 cm Ankle Left: Right: Point of Measurement: 12 cm From Medial Instep cm 31.6 cm Vascular Assessment Pulses: Dorsalis Pedis Palpable: [Right:Yes] Electronic Signature(s) Signed: 03/28/2020 4:57:23 PM By: Baruch Gouty RN, BSN Entered By: Baruch Gouty on 03/28/2020 13:59:30 -------------------------------------------------------------------------------- Multi Wound Chart Details Patient Name: Date of Service: [name] [name] 03/28/2020 1:30 PM Medical Record Number: [id number] Patient Account Number: [id number] Date of Birth/Sex: Treating RN: [birth year] (79 y.o. Orvan Falconer Primary Care Jla Reynolds: Carol Ada Other Clinician: Referring Cristine Daw: Treating Verdine Grenfell/Extender: Stefanie Libel, Candace Weeks in Treatment: 7 Vital Signs Height(in): 64 Pulse(bpm): 93 Weight(lbs): 32 Blood Pressure(mmHg): 171/97 Body Mass  Index(BMI): 45 Temperature(F): 98.6 Respiratory Rate(breaths/min): 18 Photos: [1:No Photos Right, Anterior Lower Leg] [N/A:N/A N/A] Wound Location: [1:Gradually Appeared] [N/A:N/A] Wounding Event: [1:Venous Leg Ulcer] [N/A:N/A] Primary Etiology: [1:Sleep Apnea] [N/A:N/A] Comorbid History: [1:07/30/2019] [N/A:N/A] Date Acquired: [1:7] [N/A:N/A] Weeks of Treatment: [1:Open] [N/A:N/A] Wound Status: [1:1.6x2.5x0.2] [N/A:N/A] Measurements L x W x D (cm) [1:3.142] [N/A:N/A] A (cm) : rea [1:0.628] [N/A:N/A] Volume (cm) : [1:25.90%] [N/A:N/A] % Reduction in Area: [1:-48.10%] [N/A:N/A] % Reduction in Volume: [1:Full Thickness Without Exposed] [N/A:N/A] Classification: [1:Support Structures Medium] [N/A:N/A] Exudate A mount: [1:Serous] [N/A:N/A] Exudate Type: [1:amber] [N/A:N/A] Exudate Color: [1:Flat and Intact] [N/A:N/A]  Wound Margin: [1:Small (1-33%)] [N/A:N/A] Granulation A mount: [1:Pink] [N/A:N/A] Granulation Quality: [1:Large (67-100%)] [N/A:N/A] Necrotic A mount: [1:Fat Layer (Subcutaneous Tissue): Yes N/A] Exposed Structures: [1:Fascia: No Tendon: No Muscle: No Joint: No Bone: No Small (1-33%)] [N/A:N/A] Epithelialization: [1:Debridement - Excisional] [N/A:N/A] Debridement: Pre-procedure Verification/Time Out 14:14 [N/A:N/A] Taken: [1:Lidocaine 5% topical ointment] [N/A:N/A] Pain Control: [1:Subcutaneous] [N/A:N/A] Tissue Debrided: [1:Skin/Subcutaneous Tissue] [N/A:N/A] Level: [1:4] [N/A:N/A] Debridement A (sq cm): [1:rea Curette] [N/A:N/A] Instrument: [1:Moderate] [N/A:N/A] Bleeding: [1:Pressure] [N/A:N/A] Hemostasis A chieved: [1:0] [N/A:N/A] Procedural Pain: [1:0] [N/A:N/A] Post Procedural Pain: [1:Procedure was tolerated well] [N/A:N/A] Debridement Treatment Response: [1:1.6x2.5x0.2] [N/A:N/A] Post Debridement Measurements L x W x D (cm) [1:0.628] [N/A:N/A] Post Debridement Volume: (cm) [1:Debridement] [N/A:N/A] Treatment Notes Electronic Signature(s) Signed:  03/28/2020 5:08:08 PM By: Linton Ham MD Signed: 03/29/2020 10:37:46 AM By: Carlene Coria RN Entered By: Linton Ham on 03/28/2020 14:30:24 -------------------------------------------------------------------------------- Multi-Disciplinary Care Plan Details Patient Name: Date of Service: [name] [name] 03/28/2020 1:30 PM Medical Record Number: [id number] Patient Account Number: [id number] Date of Birth/Sex: Treating RN: 1941/02/16 (79 y.o. Orvan Falconer Primary Care Trinna Kunst: Carol Ada Other Clinician: Referring Toddrick Sanna: Treating Kyomi Hector/Extender: Lona Millard in Treatment: 7 Active Inactive Venous Leg Ulcer Nursing Diagnoses: Actual venous Insuffiency (use after diagnosis is confirmed) Knowledge deficit related to disease process and management Goals: Patient will maintain optimal edema control Date Initiated: 02/07/2020 Target Resolution Date: 04/17/2020 Goal Status: Active Patient/caregiver will verbalize understanding of disease process and disease management Date Initiated: 02/07/2020 Date Inactivated: 03/28/2020 Target Resolution Date: 03/17/2020 Goal Status: Met Interventions: Assess peripheral edema status every visit. Compression as ordered Provide education on venous insufficiency Notes: Wound/Skin Impairment Nursing Diagnoses: Impaired tissue integrity Knowledge deficit related to ulceration/compromised skin integrity Goals: Patient/caregiver will verbalize understanding of skin care regimen Date Initiated: 02/07/2020 Target Resolution Date: 04/17/2020 Goal Status: Active Ulcer/skin breakdown will have a volume reduction of 30% by week 4 Date Initiated: 02/07/2020 Date Inactivated: 03/28/2020 Target Resolution Date: 03/17/2020 Goal Status: Unmet Unmet Reason: comorbities Ulcer/skin breakdown will have a volume reduction of 50% by week 8 Date Initiated: 03/28/2020 Target Resolution Date: 04/17/2020 Goal Status:  Active Interventions: Assess patient/caregiver ability to obtain necessary supplies Assess patient/caregiver ability to perform ulcer/skin care regimen upon admission and as needed Assess ulceration(s) every visit Provide education on ulcer and skin care Notes: Electronic Signature(s) Signed: 03/29/2020 10:37:46 AM By: Carlene Coria RN Entered By: Carlene Coria on 03/28/2020 14:05:53 -------------------------------------------------------------------------------- Pain Assessment Details Patient Name: Date of Service: HAYA, HEMLER [name] 03/28/2020 1:30 PM Medical Record Number: [id number] Patient Account Number: [id number] Date of Birth/Sex: Treating RN: 07/20/1941 (79 y.o. Elam Dutch Primary Care Mainor Hellmann: Carol Ada Other Clinician: Referring Eadie Repetto: Treating Chibuike Fleek/Extender: Lona Millard in Treatment: 7 Active Problems Location of Pain Severity and Description of Pain Patient Has Paino No Site Locations Rate the pain. Current Pain Level: 0 Pain Management and Medication Current Pain Management: Electronic Signature(s) Signed: 03/28/2020 4:57:23 PM By: Baruch Gouty RN, BSN Entered By: Baruch Gouty on 03/28/2020 13:51:17 -------------------------------------------------------------------------------- Patient/Caregiver Education Details Patient Name: Date of Service: [name] NICE Jerilynn Mages 8/31/2021andnbsp1:30 PM Medical Record Number: [id number] Patient Account Number: [id number] Date of Birth/Gender: Treating RN: 1941/03/07 (79 y.o. Orvan Falconer Primary Care Physician: Carol Ada Other Clinician: Referring Physician: Treating Physician/Extender: Lona Millard in Treatment: 7 Education Assessment Education Provided To: Patient Education Topics Provided Wound/Skin Impairment: Methods: Explain/Verbal Responses: State content correctly Electronic Signature(s) Signed: 03/29/2020 10:37:46 AM By: Carlene Coria RN Entered  ByCarlene Coria on 03/28/2020 14:06:13 -------------------------------------------------------------------------------- Wound Assessment Details Patient Name: Date of Service: YANINA, KNUPP [name] 03/28/2020 1:30 PM Medical Record Number: [id number] Patient Account Number: [id number] Date of Birth/Sex: Treating RN: 12-Dec-1940 (79 y.o. Elam Dutch Primary Care Cassundra Mckeever: Carol Ada Other Clinician: Referring Chelby Salata: Treating Saveah Bahar/Extender: Stefanie Libel, Candace Weeks in Treatment: 7 Wound Status Wound Number: 1 Primary Etiology: Venous Leg Ulcer Wound Location: Right, Anterior Lower Leg Wound Status: Open Wounding Event: Gradually Appeared Comorbid History: Sleep Apnea Date Acquired: 07/30/2019 Weeks Of Treatment: 7 Clustered Wound: No Wound Measurements Length: (cm) 1.6 Width: (cm) 2.5 Depth: (cm) 0.2 Area: (cm) 3.142 Volume: (cm) 0.628 % Reduction in Area: 25.9% % Reduction in Volume: -48.1% Epithelialization: Small (1-33%) Tunneling: No Undermining: No Wound Description Classification: Full Thickness Without Exposed Support Structures Wound Margin: Flat and Intact Exudate Amount: Medium Exudate Type: Serous Exudate Color: amber Foul Odor After Cleansing: No Slough/Fibrino Yes Wound Bed Granulation Amount: Small (1-33%) Exposed Structure Granulation Quality: Pink Fascia Exposed: No Necrotic Amount: Large (67-100%) Fat Layer (Subcutaneous Tissue) Exposed: Yes Necrotic Quality: Adherent Slough Tendon Exposed: No Muscle Exposed: No Joint Exposed: No Bone Exposed: No Treatment Notes Wound #1 (Right, Anterior Lower Leg) 1. Cleanse With Wound Cleanser 3. Primary Dressing Applied Hydrofera Blue 4. Secondary Dressing ABD Pad Dry Gauze 5. Secured With Recruitment consultant) Signed: 03/28/2020 4:57:23 PM By: Baruch Gouty RN, BSN Entered By: Baruch Gouty on 03/28/2020  14:01:09 -------------------------------------------------------------------------------- [place] Details Patient Name: Date of Service: [name] [name] 03/28/2020 1:30 PM Medical Record Number: [id number] Patient Account Number: [id number] Date of Birth/Sex: Treating RN: Mar 30, 1941 (79 y.o. Elam Dutch Primary Care Denham Mose: Carol Ada Other Clinician: Referring Brooklyn Alfredo: Treating Emeline Simpson/Extender: Stefanie Libel, Lenor Derrick in Treatment: 7 Vital Signs Time Taken: 13:50 Temperature (F): 98.6 Height (in): 64 Pulse (bpm): 84 Source: Stated Respiratory Rate (breaths/min): 18 Weight (lbs): 262 Blood Pressure (mmHg): 171/97 Source: Stated Reference Range: 80 - 120 mg / dl Body Mass Index (BMI): 45 Electronic Signature(s) Signed: 03/28/2020 4:57:23 PM By: Baruch Gouty RN, BSN Entered By: Baruch Gouty on 03/28/2020 13:51:06
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
## 2020-04-11 ENCOUNTER — Encounter (HOSPITAL_BASED_OUTPATIENT_CLINIC_OR_DEPARTMENT_OTHER): Payer: Medicare Other | Admitting: Internal Medicine

## 2020-04-18 ENCOUNTER — Encounter (HOSPITAL_BASED_OUTPATIENT_CLINIC_OR_DEPARTMENT_OTHER): Payer: Medicare Other | Attending: Internal Medicine | Admitting: Internal Medicine

## 2020-04-18 DIAGNOSIS — M3313 Other dermatomyositis without myopathy: Secondary | ICD-10-CM | POA: Insufficient documentation

## 2020-04-18 DIAGNOSIS — I10 Essential (primary) hypertension: Secondary | ICD-10-CM | POA: Diagnosis not present

## 2020-04-18 DIAGNOSIS — E039 Hypothyroidism, unspecified: Secondary | ICD-10-CM | POA: Insufficient documentation

## 2020-04-18 DIAGNOSIS — L97812 Non-pressure chronic ulcer of other part of right lower leg with fat layer exposed: Secondary | ICD-10-CM | POA: Diagnosis not present

## 2020-04-18 DIAGNOSIS — L97818 Non-pressure chronic ulcer of other part of right lower leg with other specified severity: Secondary | ICD-10-CM | POA: Diagnosis not present

## 2020-04-18 DIAGNOSIS — I890 Lymphedema, not elsewhere classified: Secondary | ICD-10-CM | POA: Insufficient documentation

## 2020-04-18 DIAGNOSIS — G4733 Obstructive sleep apnea (adult) (pediatric): Secondary | ICD-10-CM | POA: Insufficient documentation

## 2020-04-18 NOTE — Progress Notes (Signed)
CELESTA, [place] ([id number]) Visit Report for 04/18/2020 Arrival Information Details Patient Name: Date of Service: Janice [name], Janice [name] 04/18/2020 1:45 PM Medical Record Number: [id number] Patient Account Number: [id number] Date of Birth/Sex: Treating RN: [birth year] (79 y.o. Martyn Malay, Linda Primary Care Danton Palmateer: Carol Ada Other Clinician: Referring Treyvon Blahut: Treating Gustavo Dispenza/Extender: Lona Millard in Treatment: 10 Visit Information History Since Last Visit All ordered tests and consults were completed: Yes Patient Arrived: Gilford Rile Added or deleted any medications: No Arrival Time: 12:55 Any new allergies or adverse reactions: No Accompanied By: self Had a fall or experienced change in No Transfer Assistance: None activities of daily living that may affect Patient Identification Verified: Yes risk of falls: Patient Requires Transmission-Based Precautions: No Signs or symptoms of abuse/neglect since last visito No Patient Has Alerts: No Hospitalized since last visit: No Implantable device outside of the clinic excluding No cellular tissue based products placed in the center since last visit: Has Dressing in Place as Prescribed: Yes Pain Present Now: Yes Electronic Signature(s) Signed: 04/18/2020 4:05:29 PM By: Baruch Gouty RN, BSN Entered By: Baruch Gouty on 04/18/2020 13:05:05 -------------------------------------------------------------------------------- Clinic Level of Care Assessment Details Patient Name: Date of Service: Janice [name], Janice [name] 04/18/2020 1:45 PM Medical Record Number: [id number] Patient Account Number: [id number] Date of Birth/Sex: Treating RN: Oct 04, 1940 (79 y.o. Orvan Falconer Primary Care Telsa Dillavou: Carol Ada Other Clinician: Referring Montrez Marietta: Treating Yassmine Tamm/Extender: Lona Millard in Treatment: 10 Clinic Level of Care Assessment Items TOOL 4 Quantity Score X- 1 0 Use when only an  EandM is performed on FOLLOW-UP visit ASSESSMENTS - Nursing Assessment / Reassessment X- 1 10 Reassessment of Co-morbidities (includes updates in patient status) X- 1 5 Reassessment of Adherence to Treatment Plan ASSESSMENTS - Wound and Skin A ssessment / Reassessment X - Simple Wound Assessment / Reassessment - one wound 1 5 '[]'  - 0 Complex Wound Assessment / Reassessment - multiple wounds '[]'  - 0 Dermatologic / Skin Assessment (not related to wound area) ASSESSMENTS - Focused Assessment '[]'  - 0 Circumferential Edema Measurements - multi extremities '[]'  - 0 Nutritional Assessment / Counseling / Intervention '[]'  - 0 Lower Extremity Assessment (monofilament, tuning fork, pulses) '[]'  - 0 Peripheral Arterial Disease Assessment (using hand held doppler) ASSESSMENTS - Ostomy and/or Continence Assessment and Care '[]'  - 0 Incontinence Assessment and Management '[]'  - 0 Ostomy Care Assessment and Management (repouching, etc.) PROCESS - Coordination of Care X - Simple Patient / Family Education for ongoing care 1 15 '[]'  - 0 Complex (extensive) Patient / Family Education for ongoing care X- 1 10 Staff obtains Programmer, systems, Records, T Results / Process Orders est '[]'  - 0 Staff telephones HHA, Nursing Homes / Clarify orders / etc '[]'  - 0 Routine Transfer to another Facility (non-emergent condition) '[]'  - 0 Routine Hospital Admission (non-emergent condition) '[]'  - 0 New Admissions / Biomedical engineer / Ordering NPWT Apligraf, etc. , '[]'  - 0 Emergency Hospital Admission (emergent condition) X- 1 10 Simple Discharge Coordination '[]'  - 0 Complex (extensive) Discharge Coordination PROCESS - Special Needs '[]'  - 0 Pediatric / Minor Patient Management '[]'  - 0 Isolation Patient Management '[]'  - 0 Hearing / Language / Visual special needs '[]'  - 0 Assessment of Community assistance (transportation, D/C planning, etc.) '[]'  - 0 Additional assistance / Altered mentation '[]'  - 0 Support Surface(s)  Assessment (bed, cushion, seat, etc.) INTERVENTIONS - Wound Cleansing / Measurement X - Simple Wound Cleansing - one wound 1 5 '[]'  - 0 Complex Wound Cleansing -  multiple wounds X- 1 5 Wound Imaging (photographs - any number of wounds) '[]'  - 0 Wound Tracing (instead of photographs) X- 1 5 Simple Wound Measurement - one wound '[]'  - 0 Complex Wound Measurement - multiple wounds INTERVENTIONS - Wound Dressings '[]'  - 0 Small Wound Dressing one or multiple wounds X- 1 15 Medium Wound Dressing one or multiple wounds '[]'  - 0 Large Wound Dressing one or multiple wounds X- 1 5 Application of Medications - topical '[]'  - 0 Application of Medications - injection INTERVENTIONS - Miscellaneous '[]'  - 0 External ear exam '[]'  - 0 Specimen Collection (cultures, biopsies, blood, body fluids, etc.) '[]'  - 0 Specimen(s) / Culture(s) sent or taken to Lab for analysis '[]'  - 0 Patient Transfer (multiple staff / Harrel Lemon Lift / Similar devices) '[]'  - 0 Simple Staple / Suture removal (25 or less) '[]'  - 0 Complex Staple / Suture removal (26 or more) '[]'  - 0 Hypo / Hyperglycemic Management (close monitor of Blood Glucose) '[]'  - 0 Ankle / Brachial Index (ABI) - do not check if billed separately X- 1 5 Vital Signs Has the patient been seen at the hospital within the last three years: Yes Total Score: 95 Level Of Care: New/Established - Level 3 Electronic Signature(s) Signed: 04/18/2020 4:24:48 PM By: Carlene Coria RN Entered By: Carlene Coria on 04/18/2020 13:43:37 -------------------------------------------------------------------------------- Encounter Discharge Information Details Patient Name: Date of Service: Janice Fordyce [name] 04/18/2020 1:45 PM Medical Record Number: [id number] Patient Account Number: [id number] Date of Birth/Sex: Treating RN: [birth year] (79 y.o. Clearnce Sorrel Primary Care Kekoa Fyock: Carol Ada Other Clinician: Referring Anairis Knick: Treating Zelie Asbill/Extender: Lona Millard in Treatment: 10 Encounter Discharge Information Items Discharge Condition: Stable Ambulatory Status: Walker Discharge Destination: Home Transportation: Other Accompanied By: self Schedule Follow-up Appointment: Yes Clinical Summary of Care: Patient Declined Electronic Signature(s) Signed: 04/18/2020 4:07:33 PM By: Kela Millin Entered By: Kela Millin on 04/18/2020 13:52:14 -------------------------------------------------------------------------------- Lower Extremity Assessment Details Patient Name: Date of Service: Janice Fordyce [name] 04/18/2020 1:45 PM Medical Record Number: [id number] Patient Account Number: [id number] Date of Birth/Sex: Treating RN: 02-16-1941 (79 y.o. Elam Dutch Primary Care Semaja Lymon: Carol Ada Other Clinician: Referring Anorah Trias: Treating Mallie Giambra/Extender: Stefanie Libel, Hal Hope Weeks in Treatment: 10 Edema Assessment Assessed: [Left: No] [Right: No] Edema: [Left: Ye] [Right: s] Calf Left: Right: Point of Measurement: 48 cm From Medial Instep cm 49 cm Ankle Left: Right: Point of Measurement: 12 cm From Medial Instep cm 31 cm Vascular Assessment Pulses: Dorsalis Pedis Palpable: [Right:Yes] Electronic Signature(s) Signed: 04/18/2020 4:05:29 PM By: Baruch Gouty RN, BSN Entered By: Baruch Gouty on 04/18/2020 13:11:05 -------------------------------------------------------------------------------- Multi Wound Chart Details Patient Name: Date of Service: Janice Fordyce [name] 04/18/2020 1:45 PM Medical Record Number: [id number] Patient Account Number: [id number] Date of Birth/Sex: Treating RN: 11-Jan-1941 (79 y.o. Orvan Falconer Primary Care Nilan Iddings: Carol Ada Other Clinician: Referring Kyshon Tolliver: Treating Pharaoh Pio/Extender: Stefanie Libel, Lenor Derrick in Treatment: 10 Vital Signs Height(in): 64 Pulse(bpm): 84 Weight(lbs): 43 Blood Pressure(mmHg): 148/76 Body Mass  Index(BMI): 45 Temperature(F): 98.2 Respiratory Rate(breaths/min): 18 Photos: [1:No Photos Right, Anterior Lower Leg] [N/A:N/A N/A] Wound Location: [1:Gradually Appeared] [N/A:N/A] Wounding Event: [1:Venous Leg Ulcer] [N/A:N/A] Primary Etiology: [1:Sleep Apnea] [N/A:N/A] Comorbid History: [1:07/30/2019] [N/A:N/A] Date Acquired: [1:10] [N/A:N/A] Weeks of Treatment: [1:Open] [N/A:N/A] Wound Status: [1:1.2x1.8x0.1] [N/A:N/A] Measurements L x W x D (cm) [1:1.696] [N/A:N/A] A (cm) : rea [1:0.17] [N/A:N/A] Volume (cm) : [1:60.00%] [N/A:N/A] % Reduction in A rea: [1:59.90%] [N/A:N/A] % Reduction in Volume: [1:Full  Thickness Without Exposed] [N/A:N/A] Classification: [1:Support Structures Small] [N/A:N/A] Exudate Amount: [1:Serous] [N/A:N/A] Exudate Type: [1:amber] [N/A:N/A] Exudate Color: [1:Flat and Intact] [N/A:N/A] Wound Margin: [1:Small (1-33%)] [N/A:N/A] Granulation Amount: [1:Pink] [N/A:N/A] Granulation Quality: [1:Large (67-100%)] [N/A:N/A] Necrotic Amount: [1:Fat Layer (Subcutaneous Tissue): Yes N/A] Exposed Structures: [1:Fascia: No Tendon: No Muscle: No Joint: No Bone: No Small (1-33%)] [N/A:N/A] Treatment Notes Electronic Signature(s) Signed: 04/18/2020 4:19:57 PM By: Linton Ham MD Signed: 04/18/2020 4:24:48 PM By: Carlene Coria RN Entered By: Linton Ham on 04/18/2020 13:41:56 -------------------------------------------------------------------------------- Multi-Disciplinary Care Plan Details Patient Name: Date of Service: Janice Fordyce [name] 04/18/2020 1:45 PM Medical Record Number: [id number] Patient Account Number: [id number] Date of Birth/Sex: Treating RN: 1940-09-15 (79 y.o. Orvan Falconer Primary Care Aubery Date: Carol Ada Other Clinician: Referring Traeson Dusza: Treating Saba Gomm/Extender: Stefanie Libel, Lenor Derrick in Treatment: 10 Active Inactive Wound/Skin Impairment Nursing Diagnoses: Impaired tissue integrity Knowledge deficit related  to ulceration/compromised skin integrity Goals: Patient/caregiver will verbalize understanding of skin care regimen Date Initiated: 02/07/2020 Target Resolution Date: 05/17/2020 Goal Status: Active Ulcer/skin breakdown will have a volume reduction of 30% by week 4 Date Initiated: 02/07/2020 Date Inactivated: 03/28/2020 Target Resolution Date: 03/17/2020 Goal Status: Unmet Unmet Reason: comorbities Ulcer/skin breakdown will have a volume reduction of 50% by week 8 Date Initiated: 03/28/2020 Date Inactivated: 04/18/2020 Target Resolution Date: 04/17/2020 Goal Status: Met Ulcer/skin breakdown will have a volume reduction of 80% by week 12 Date Initiated: 04/18/2020 Target Resolution Date: 05/17/2020 Goal Status: Active Interventions: Assess patient/caregiver ability to obtain necessary supplies Assess patient/caregiver ability to perform ulcer/skin care regimen upon admission and as needed Assess ulceration(s) every visit Provide education on ulcer and skin care Notes: Electronic Signature(s) Signed: 04/18/2020 4:24:48 PM By: Carlene Coria RN Entered By: Carlene Coria on 04/18/2020 13:34:27 -------------------------------------------------------------------------------- Pain Assessment Details Patient Name: Date of Service: Janice [name], Janice [name] 04/18/2020 1:45 PM Medical Record Number: [id number] Patient Account Number: [id number] Date of Birth/Sex: Treating RN: [birth year] (79 y.o. Elam Dutch Primary Care Mckay Tegtmeyer: Carol Ada Other Clinician: Referring Torrence Branagan: Treating Harles Evetts/Extender: Lona Millard in Treatment: 10 Active Problems Location of Pain Severity and Description of Pain Patient Has Paino Yes Site Locations Pain Location: Pain Location: Pain in Ulcers With Dressing Change: Yes Duration of the Pain. Constant / Intermittento Intermittent Rate the pain. Current Pain Level: 0 Worst Pain Level: 6 Least Pain Level: 0 Character of  Pain Describe the Pain: Tender Pain Management and Medication Current Pain Management: Medication: Yes Other: change dressing Is the Current Pain Management Adequate: Adequate How does your wound impact your activities of daily livingo Sleep: No Bathing: No Appetite: No Relationship With Others: No Bladder Continence: No Emotions: No Bowel Continence: No Work: No Toileting: No Drive: No Dressing: No Hobbies: No Electronic Signature(s) Signed: 04/18/2020 4:05:29 PM By: Baruch Gouty RN, BSN Entered By: Baruch Gouty on 04/18/2020 13:10:21 -------------------------------------------------------------------------------- Patient/Caregiver Education Details Patient Name: Date of Service: Janice Fordyce NICE Jerilynn Mages 9/21/2021andnbsp1:45 PM Medical Record Number: [id number] Patient Account Number: [id number] Date of Birth/Gender: Treating RN: [birth year] (79 y.o. Orvan Falconer Primary Care Physician: Carol Ada Other Clinician: Referring Physician: Treating Physician/Extender: Lona Millard in Treatment: 10 Education Assessment Education Provided To: Patient Education Topics Provided Wound/Skin Impairment: Methods: Explain/Verbal Responses: State content correctly Electronic Signature(s) Signed: 04/18/2020 4:24:48 PM By: Carlene Coria RN Entered By: Carlene Coria on 04/18/2020 13:34:39 -------------------------------------------------------------------------------- Wound Assessment Details Patient Name: Date of Service: Janice Fordyce [name] 04/18/2020 1:45 PM Medical Record Number: [id number] Patient Account Number:  [id number] Date of Birth/Sex: Treating RN: Aug 06, 1940 (79 y.o. Elam Dutch Primary Care Calden Dorsey: Carol Ada Other Clinician: Referring Charley Lafrance: Treating Usman Millett/Extender: Stefanie Libel, Hal Hope Weeks in Treatment: 10 Wound Status Wound Number: 1 Primary Etiology: Venous Leg Ulcer Wound Location: Right, Anterior  Lower Leg Wound Status: Open Wounding Event: Gradually Appeared Comorbid History: Sleep Apnea Date Acquired: 07/30/2019 Weeks Of Treatment: 10 Clustered Wound: No Wound Measurements Length: (cm) 1.2 Width: (cm) 1.8 Depth: (cm) 0.1 Area: (cm) 1.696 Volume: (cm) 0.17 % Reduction in Area: 60% % Reduction in Volume: 59.9% Epithelialization: Small (1-33%) Tunneling: No Undermining: No Wound Description Classification: Full Thickness Without Exposed Support Structures Wound Margin: Flat and Intact Exudate Amount: Small Exudate Type: Serous Exudate Color: amber Foul Odor After Cleansing: No Slough/Fibrino Yes Wound Bed Granulation Amount: Small (1-33%) Exposed Structure Granulation Quality: Pink Fascia Exposed: No Necrotic Amount: Large (67-100%) Fat Layer (Subcutaneous Tissue) Exposed: Yes Necrotic Quality: Adherent Slough Tendon Exposed: No Muscle Exposed: No Joint Exposed: No Bone Exposed: No Treatment Notes Wound #1 (Right, Anterior Lower Leg) 1. Cleanse With Wound Cleanser Soap and water 3. Primary Dressing Applied Hydrofera Blue 4. Secondary Dressing ABD Pad Dry Gauze 5. Secured With Recruitment consultant) Signed: 04/18/2020 4:05:29 PM By: Baruch Gouty RN, BSN Entered By: Baruch Gouty on 04/18/2020 13:08:31 -------------------------------------------------------------------------------- [place] Details Patient Name: Date of Service: Janice Fordyce [name] 04/18/2020 1:45 PM Medical Record Number: [id number] Patient Account Number: [id number] Date of Birth/Sex: Treating RN: 09/12/1940 (79 y.o. Elam Dutch Primary Care Yailene Badia: Carol Ada Other Clinician: Referring Aariz Maish: Treating Vernell Back/Extender: Stefanie Libel, Lenor Derrick in Treatment: 10 Vital Signs Time Taken: 12:58 Temperature (F): 98.2 Height (in): 64 Pulse (bpm): 84 Source: Stated Respiratory Rate (breaths/min): 18 Weight (lbs): 262 Blood Pressure (mmHg):  148/76 Source: Stated Reference Range: 80 - 120 mg / dl Body Mass Index (BMI): 45 Electronic Signature(s) Signed: 04/18/2020 4:05:29 PM By: Baruch Gouty RN, BSN Entered By: Baruch Gouty on 04/18/2020 13:13:04

## 2020-04-18 NOTE — Progress Notes (Signed)
Capelli, Sharman M. ([id number]) °Visit Report for 04/18/2020 °HPI Details °Patient Name: Date of Service: °Janice [name], Janice [name] 04/18/2020 1:45 PM °Medical Record Number: [id number] °Patient Account Number: [id number] °Date of Birth/Sex: Treating RN: °01/04/1941 (79 y.o. F) Epps, Carrie °Primary Care Provider: Smith, Candace Other Clinician: °Referring Provider: °Treating Provider/Extender: ,  °Smith, Candace °Weeks in Treatment: 10 °History of Present Illness °HPI Description: ADMISSION °02/07/2020 °This is an 79-year-old woman who is transferring from our sister clinic in [place] after their closure. She has been treated by Dr. Hernandez in the clinic there °since the fall 2018. She was healed in August 2019 but readmitted to the clinic in January 2021. She has compression pumps that were apparently arranged for °her by Dr. Duda in the past she uses them once a day although she has not been able to do this recently subsequent to a death of a close friend. 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 Her particular condition is extremely painful to the touch therefore she cannot tolerate stockings or compression wraps although she has not had 1 °of these recently. They have been using calcium alginate on the wound. Recently a culture was done and she completed a course of ciprofloxacin. She thinks °the wound has been gradually contracting. 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 She appears to be walking with a °walker. What drives with hand controllers. She has a custom shoe with an AFO brace on the right leg °7/19; patient was using twice a day  compression pumps up until 2 days ago she had some personal issues and then was scared by using the lightening. She is °dressing this is self with Iodosorb/Iodoflex. She is intolerant of compression wraps °7/27; wound exam; right anterior lower tibia. She does not allow compression but I did not manage to get her to agree to use her external compression pumps °twice a day. She is using Iodoflex on the wound °8/10-Patient returns at 2 weeks, we have been using Iodoflex on the wound to the right anterior tibia, patient has more pain now than before and has not been °using the lymphedema pumps °8/31; 3-week follow-up. Changed to Hydrofera Blue last time. The patient has a myriad of different reasons why she cannot use her compression pumps. °9/21; 3-week follow-up. Using Hydrofera Blue. She is episodically using her compression pumps because of issues related the pain etc. °Electronic Signature(s) °Signed: 04/18/2020 4:19:57 PM By: ,  MD °Entered By: ,  on 04/18/2020 13:42:30 °-------------------------------------------------------------------------------- °Physical Exam Details °Patient Name: Date of Service: °Janice [name], Janice [name] 04/18/2020 1:45 PM °Medical Record Number: [id number] °Patient Account Number: [id number] °Date of Birth/Sex: Treating RN: °05/12/1941 (79 y.o. F) Epps, Carrie °Primary Care Provider: Smith, Candace Other Clinician: °Referring Provider: °Treating Provider/Extender: ,  °Smith, Candace °Weeks in Treatment: 10 °Constitutional °Patient is hypertensive.. Pulse regular and within target range for patient.. Respirations regular, non-labored and within target range.. Temperature is normal and °within the target range for the patient.. Appears in no distress. °Cardiovascular °Marked edema in the right leg. °Notes °Wound exam; right anterior leg over the tibial area. The wound itself initially looked about the same size however when you put this under illumination there  is °new areas of   epithelialization. The remaining wound area goes through the center of the quarter sized opening. This may require debridement but I did not do it °today. There is no evidence of surrounding infection °Electronic Signature(s) °Signed: 04/18/2020 4:19:57 PM By: ,  MD °Entered By: ,  on 04/18/2020 13:44:34 °-------------------------------------------------------------------------------- °Physician Orders Details °Patient Name: Date of Service: °Janice [name], Janice [name] 04/18/2020 1:45 PM °Medical Record Number: [id number] °Patient Account Number: [id number] °Date of Birth/Sex: Treating RN: °03/04/1941 (79 y.o. 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 °Dressing Change Frequency °Wound #1 Right,Anterior Lower Leg °Change Dressing every other day. °Wound Cleansing °Wound #1 Right,Anterior Lower Leg °May shower and wash wound with soap and water. - on days that dressing is changed °Primary Wound Dressing °Wound #1 Right,Anterior Lower Leg °Hydrofera Blue - classic °Secondary Dressing °Wound #1 Right,Anterior Lower Leg °Dry Gauze - secure with tape °Edema Control °Avoid standing for long periods of time °Elevate legs to the level of the heart or above for 30 minutes daily and/or when sitting, a frequency of: - throughout the day °Exercise regularly °Segmental Compressive Device. - Lymphedema pumps twice a day for 1 hour each time °Electronic Signature(s) °Signed: 04/18/2020 4:19:57 PM By: ,  MD °Signed: 04/18/2020 4:24:48 PM By:  Epps, Carrie RN °Entered By: Epps, Carrie on 04/18/2020 13:41:09 °-------------------------------------------------------------------------------- °Problem List Details °Patient Name: Date of Service: °Janice [name], Janice [name] 04/18/2020 1:45 PM °Medical Record Number: [id number] °Patient Account Number: [id number] °Date of Birth/Sex: Treating RN: °11/01/1940 (79 y.o. 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 04/18/2020 1:45 PM °Medical Record Number: [id number] °Patient Account Number: [id number] °Date of Birth/Sex: Treating RN: °07/27/1941 (79 y.o. F) Epps, Carrie °Primary Care Provider: Smith, Candace Other Clinician: °Referring Provider: °Treating Provider/Extender: ,  °Smith, Candace °Weeks in Treatment: 10 °Subjective °History of Present Illness (HPI) °ADMISSION °02/07/2020 °This is an 79-year-old woman who is transferring from our sister clinic in [place] after their closure. She has been treated by Dr. Hernandez in the clinic there °since the fall 2018. She was healed in August 2019 but readmitted to the clinic in January 2021. She has compression pumps that were apparently arranged for °her by Dr. Duda in the past she uses them once a day  although she has not been able to do this recently subsequent to a death of a close friend. 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 Her particular condition is extremely painful to the touch therefore she cannot tolerate stockings or compression wraps although she has not had 1 °  of these recently. They have been using calcium alginate on the wound. Recently a culture was done and she completed a course of ciprofloxacin. She thinks the wound has been gradually contracting. 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 She appears to be walking with a walker. What drives with hand controllers. She has a custom shoe with an AFO brace on the right leg 7/19; patient was using twice a day compression pumps up until 2 days ago she had some personal issues and then was scared by using the lightening. She is dressing this is self with Iodosorb/Iodoflex. She is intolerant of compression wraps 7/27; wound exam; right anterior lower tibia. She does not allow compression but I did not manage to get her to agree to use her external compression pumps twice a day. She is using Iodoflex on the wound 8/10-Patient returns at 2 weeks, we have been using Iodoflex on the wound to the right anterior tibia, patient has more pain now than before and has not been using the lymphedema pumps 8/31; 3-week follow-up. Changed to [facility] last time. The patient has a myriad of different reasons why she cannot use her compression pumps. 9/21; 3-week follow-up. Using Hydrofera Blue. She is episodically using her compression pumps because of issues related the pain  etc. Objective Constitutional Patient is hypertensive.. Pulse regular and within target range for patient.Marland Kitchen Respirations regular, non-labored and within target range.. Temperature is normal and within the target range for the patient.Marland Kitchen Appears in no distress. Vitals Time Taken: 12:58 PM, Height: 64 in, Source: Stated, Weight: 262 lbs, Source: Stated, BMI: 45, Temperature: 98.2 F, Pulse: 84 bpm, Respiratory Rate: 18 breaths/min, Blood Pressure: 148/76 mmHg. Cardiovascular Marked edema in the right leg. General Notes: Wound exam; right anterior leg over the tibial area. The wound itself initially looked about the same size however when you put this under illumination there is new areas of epithelialization. The remaining wound area goes through the center of the quarter sized opening. This may require debridement but I did not do it today. There is no evidence of surrounding infection Integumentary (Hair, Skin) Wound #1 status is Open. Original cause of wound was Gradually Appeared. The wound is located on the Right,Anterior Lower Leg. The wound measures 1.2cm length x 1.8cm width x 0.1cm depth; 1.696cm^2 area and 0.17cm^3 volume. There is Fat Layer (Subcutaneous Tissue) exposed. There is no tunneling or undermining noted. There is a small amount of serous drainage noted. The wound margin is flat and intact. There is small (1-33%) pink granulation within the wound bed. There is a large (67-100%) amount of necrotic tissue within the wound bed including Adherent Slough. 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 Dressing Change Frequency: Wound #1 Right,Anterior Lower Leg: Change Dressing every other day. Wound Cleansing: Wound #1 Right,Anterior Lower Leg: May shower and wash  wound with soap and water. - on days that dressing is changed Primary Wound Dressing: Wound #1 Right,Anterior Lower Leg: Hydrofera Blue - classic Secondary Dressing: Wound #1 Right,Anterior Lower Leg: Dry Gauze - secure with tape Edema Control: Avoid standing for long periods of time Elevate legs to the level of the heart or above  for 30 minutes daily and/or when sitting, a frequency of: - throughout the day Exercise regularly Segmental Compressive Device. - Lymphedema pumps twice a day for 1 hour each time #1 I am going to continue with the Hydrofera Blue 2. She has some irregular areas of epithelialization. Still a open area in the middle of this circular wound. I do not know about the surface here although I did not debride it today. However I did discuss that she may require this to be debrided next time if we do not get full epithelialization. 3. She will not consider compression stockings and I think her use of the compression pumps is less than 100%. I did talk to her today about the need to use the compression pumps going forward in order to maintain skin integrity Electronic Signature(s) Signed: 04/18/2020 4:19:57 PM By: Linton Ham MD Entered By: Linton Ham on 04/18/2020 13:45:47 -------------------------------------------------------------------------------- SuperBill Details Patient Name: Date of Service: Janice Fordyce [name] 04/18/2020 Medical Record Number: [id number] Patient Account Number: [id number] Date of Birth/Sex: Treating RN: 11-Sep-1940 (79 y.o. 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

## 2020-04-20 DIAGNOSIS — L602 Onychogryphosis: Secondary | ICD-10-CM | POA: Diagnosis not present

## 2020-04-20 DIAGNOSIS — I739 Peripheral vascular disease, unspecified: Secondary | ICD-10-CM | POA: Diagnosis not present

## 2020-04-20 DIAGNOSIS — I879 Disorder of vein, unspecified: Secondary | ICD-10-CM | POA: Diagnosis not present

## 2020-04-20 DIAGNOSIS — M6281 Muscle weakness (generalized): Secondary | ICD-10-CM | POA: Diagnosis not present

## 2020-04-20 DIAGNOSIS — B351 Tinea unguium: Secondary | ICD-10-CM | POA: Diagnosis not present

## 2020-04-20 DIAGNOSIS — I83019 Varicose veins of right lower extremity with ulcer of unspecified site: Secondary | ICD-10-CM | POA: Diagnosis not present

## 2020-05-02 DIAGNOSIS — G4733 Obstructive sleep apnea (adult) (pediatric): Secondary | ICD-10-CM | POA: Diagnosis not present

## 2020-05-04 ENCOUNTER — Encounter (HOSPITAL_BASED_OUTPATIENT_CLINIC_OR_DEPARTMENT_OTHER): Payer: Medicare Other | Admitting: Internal Medicine

## 2020-05-04 DIAGNOSIS — M3392 Dermatopolymyositis, unspecified with myopathy: Secondary | ICD-10-CM | POA: Diagnosis not present

## 2020-05-04 DIAGNOSIS — E7800 Pure hypercholesterolemia, unspecified: Secondary | ICD-10-CM | POA: Diagnosis not present

## 2020-05-04 DIAGNOSIS — I10 Essential (primary) hypertension: Secondary | ICD-10-CM | POA: Diagnosis not present

## 2020-05-04 DIAGNOSIS — D649 Anemia, unspecified: Secondary | ICD-10-CM | POA: Diagnosis not present

## 2020-05-04 DIAGNOSIS — R29898 Other symptoms and signs involving the musculoskeletal system: Secondary | ICD-10-CM | POA: Diagnosis not present

## 2020-05-04 DIAGNOSIS — Z23 Encounter for immunization: Secondary | ICD-10-CM | POA: Diagnosis not present

## 2020-05-04 DIAGNOSIS — N3281 Overactive bladder: Secondary | ICD-10-CM | POA: Diagnosis not present

## 2020-05-04 DIAGNOSIS — G500 Trigeminal neuralgia: Secondary | ICD-10-CM | POA: Diagnosis not present

## 2020-05-04 DIAGNOSIS — E039 Hypothyroidism, unspecified: Secondary | ICD-10-CM | POA: Diagnosis not present

## 2020-05-11 ENCOUNTER — Encounter (HOSPITAL_BASED_OUTPATIENT_CLINIC_OR_DEPARTMENT_OTHER): Payer: Medicare Other | Attending: Internal Medicine | Admitting: Internal Medicine

## 2020-05-11 ENCOUNTER — Other Ambulatory Visit: Payer: Self-pay

## 2020-05-11 DIAGNOSIS — I890 Lymphedema, not elsewhere classified: Secondary | ICD-10-CM | POA: Insufficient documentation

## 2020-05-11 DIAGNOSIS — L97818 Non-pressure chronic ulcer of other part of right lower leg with other specified severity: Secondary | ICD-10-CM | POA: Diagnosis not present

## 2020-05-11 DIAGNOSIS — L97828 Non-pressure chronic ulcer of other part of left lower leg with other specified severity: Secondary | ICD-10-CM | POA: Diagnosis not present

## 2020-05-11 DIAGNOSIS — L97812 Non-pressure chronic ulcer of other part of right lower leg with fat layer exposed: Secondary | ICD-10-CM | POA: Diagnosis not present

## 2020-05-11 DIAGNOSIS — M3312 Other dermatopolymyositis with myopathy: Secondary | ICD-10-CM | POA: Insufficient documentation

## 2020-05-11 NOTE — Progress Notes (Signed)
Janice [name] ([id number]) Visit Report for 05/11/2020 HPI Details Patient Name: Date of Service: Janice [name], Janice [name] 05/11/2020 1:45 PM Medical Record Number: [id number] Patient Account Number: [id number] Date of Birth/Sex: Treating RN: [birth year] (79 y.o. Janice [name] Primary Care Provider: Carol [name] Other Clinician: Referring Provider: Treating Provider/Extender: Janice [name] in Treatment: 13 History of Present Illness HPI Description: ADMISSION 02/07/2020 This is an 79 year old woman who is transferring from our sister clinic in [place] after their closure. She has been treated by Janice [name] in the clinic there since the fall 2018. She was healed in August 2019 but readmitted to the clinic in January 2021. She has compression pumps that were apparently arranged for her by Janice [name] in the past she uses them once a day although she has not been able to do this recently subsequent to a death of a close friend. 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 Her particular condition is extremely painful to the touch therefore she cannot tolerate stockings or compression wraps although she has not had 1 of these recently. They have been using calcium alginate on the wound. Recently a culture was done and she completed a course of ciprofloxacin. She thinks the wound has been gradually contracting. 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 She appears to be walking with a walker. What drives with hand controllers. She has a custom shoe with an AFO brace on the right leg 7/19; patient was using twice a  day compression pumps up until 2 days ago she had some personal issues and then was scared by using the lightening. She is dressing this is self with Iodosorb/Iodoflex. She is intolerant of compression wraps 7/27; wound exam; right anterior lower tibia. She does not allow compression but I did not manage to get her to agree to use her external compression pumps twice a day. She is using Iodoflex on the wound 8/10-Patient returns at 2 weeks, we have been using Iodoflex on the wound to the right anterior tibia, patient has more pain now than before and has not been using the lymphedema pumps 8/31; 3-week follow-up. Changed to [facility] last time. The patient has a myriad of different reasons why she cannot use her compression pumps. 9/21; 3-week follow-up. Using Hydrofera Blue. She is episodically using her compression pumps because of issues related the pain etc. 10/14; 3-week follow-up. We are using Hydrofera Blue. Wound dimensions are better. She will not allow any form of compression stocking on the skin of her right leg. She has external compression pumps at home but I am not really sure how much she is using this she says once a day however. We are using Hydrofera Blue with border foam Electronic Signature(s) Signed: 05/11/2020 5:33:07 PM By: Janice Ham MD Entered By: Janice [name] on 05/11/2020 14:57:25 -------------------------------------------------------------------------------- Physical Exam Details Patient Name: Date of Service: Janice Fordyce [name] 05/11/2020 1:45 PM Medical Record Number: [id number] Patient Account Number: [id number] Date of Birth/Sex: Treating RN: 11-17-1940 (79 y.o. Janice [name] Primary Care Provider: Carol [name] Other Clinician: Referring Provider: Treating Provider/Extender: Janice [name] in Treatment: 13 Constitutional Patient is hypertensive.. Pulse regular and within target range for patient.Marland Kitchen  Respirations regular,  non-labored and within target range.. Temperature is normal and within the target range for the patient.Marland Kitchen Appears in no distress. Notes Wound exam; right anterior lower leg over the tibia. This is a very small wound which looks healthy and has surrounding epithelialization the edema control in the right leg is exceptionally poor. Very concerned that this wound would not heal although it is better today. I would also be concerned about further skin breakdown with edema like this. There is no evidence of infection Electronic Signature(s) Signed: 05/11/2020 5:33:07 PM By: Janice Ham MD Entered By: Janice [name] on 05/11/2020 14:58:23 -------------------------------------------------------------------------------- Physician Orders Details Patient Name: Date of Service: Janice Fordyce [name] 05/11/2020 1:45 PM Medical Record Number: [id number] Patient Account Number: [id number] Date of Birth/Sex: Treating RN: January 11, 1941 (79 y.o. 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 Dressing Change Frequency Wound #1 Right,Anterior Lower Leg Change Dressing every other day. Wound Cleansing Wound #1 Right,Anterior Lower Leg May shower and wash wound with soap and water. - on days that dressing is changed Primary Wound Dressing Wound #1 Right,Anterior Lower Leg Hydrofera Blue - classic Secondary Dressing Wound #1 Right,Anterior Lower Leg Dry Gauze - secure with tape Edema Control Avoid standing for long periods of time Elevate legs to the level of the  heart or above for 30 minutes daily and/or when sitting, a frequency of: - throughout the day Exercise regularly Segmental Compressive Device. - Lymphedema pumps twice a day for 1 hour each time. Electronic Signature(s) Signed: 05/11/2020 5:33:07 PM By: Janice Ham MD Signed: 05/11/2020 5:51:00 PM By: Deon Pilling Entered By: Deon Pilling on 05/11/2020 14:36:27 -------------------------------------------------------------------------------- Problem List Details Patient Name: Date of Service: Janice Fordyce [name] 05/11/2020 1:45 PM Medical Record Number: [id number] Patient Account Number: [id number] Date of Birth/Sex: Treating RN: 18-Nov-1940 (79 y.o. 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 05/11/2020 1:45 PM Medical Record Number: [id number] Patient Account Number: [id number] Date of Birth/Sex: Treating RN: 1940/11/04 (79 y.o. Janice [name] Primary Care Provider: Carol [name] Other Clinician: Referring Provider: Treating Provider/Extender: Stefanie Libel, Lenor Derrick in Treatment: 13 Subjective History of Present Illness (HPI) ADMISSION 02/07/2020 This is an 79 year old woman who is transferring from our  sister clinic in [place] after their closure. She has been treated by Janice [name] in the clinic there since the fall 2018. She was healed in August 2019 but readmitted to the clinic in January 2021. She has compression pumps that were apparently arranged for her by Janice [name] in the past she uses them once a day although she has not been able to do this recently subsequent to a  death of a close friend. 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 Her particular condition is extremely painful to the touch therefore she cannot tolerate stockings or compression wraps although she has not had 1 of these recently. They have been using calcium alginate on the wound. Recently a culture was done and she completed a course of ciprofloxacin. She thinks the wound has been gradually contracting. 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 She appears to be walking with a walker. What drives with hand controllers. She has a custom shoe with an AFO brace on the right leg 7/19; patient was using twice a day compression pumps up until 2 days ago she had some personal issues and then was scared by using the lightening. She is dressing this is self with Iodosorb/Iodoflex. She is intolerant of compression wraps 7/27; wound exam; right anterior lower tibia. She does not allow compression but I did not manage to get her to agree to use her external compression pumps twice a day. She is using Iodoflex on the wound 8/10-Patient returns at 2 weeks, we have been using Iodoflex on the wound to the right anterior tibia, patient has more pain now than before and has not  been using the lymphedema pumps 8/31; 3-week follow-up. Changed to [facility] last time. The patient has a myriad of different reasons why she cannot use her compression pumps. 9/21; 3-week follow-up. Using Hydrofera Blue. She is episodically using her compression pumps because of issues related the pain etc. 10/14; 3-week follow-up. We are using Hydrofera Blue. Wound dimensions are better. She will not allow any form of compression stocking on the skin of her right leg. She has external compression pumps at home but I am not really sure how much she is using this she says once a day however. We are using Hydrofera Blue with border foam Objective Constitutional Patient is hypertensive.. Pulse regular and within target range for patient.Marland Kitchen Respirations regular, non-labored and within target range.. Temperature is normal and within the target range for the patient.Marland Kitchen Appears in no distress. Vitals Time Taken: 2:18 PM, Height: 64 in, Weight: 262 lbs, BMI: 45, Temperature: 97.2 F, Pulse: 76 bpm, Respiratory Rate: 18 breaths/min, Blood Pressure: 157/87 mmHg. General Notes: Wound exam; right anterior lower leg over the tibia. This is a very small wound which looks healthy and has surrounding epithelialization the edema control in the right leg is exceptionally poor. Very concerned that this wound would not heal although it is better today. I would also be concerned about further skin breakdown with edema like this. There is no evidence of infection Integumentary (Hair, Skin) Wound #1 status is Open. Original cause of wound was Gradually Appeared. The wound is located on the Right,Anterior Lower Leg. The wound measures 1cm length x 1.5cm width x 0.1cm depth; 1.178cm^2 area and 0.118cm^3 volume. There is Fat Layer (Subcutaneous Tissue) exposed. There is no tunneling or undermining noted. There is a medium amount of serous drainage noted. The wound margin is flat and intact. There is medium (34-66%)  pink granulation within the wound bed. There is a medium (34-66%) amount of necrotic tissue within the wound bed including Adherent Slough. 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 Dressing Change Frequency: Wound #1 Right,Anterior Lower Leg: Change Dressing every other day. Wound Cleansing: Wound #1 Right,Anterior Lower Leg: May shower and wash wound with soap and water. - on days that dressing is changed Primary Wound Dressing: Wound #1 Right,Anterior Lower Leg: Hydrofera Blue - classic Secondary Dressing: Wound #1 Right,Anterior Lower Leg: Dry Gauze - secure with tape Edema Control: Avoid standing for long periods of time Elevate legs to the level of the heart or above for 30 minutes daily and/or when sitting, a frequency of: - throughout the day Exercise regularly Segmental Compressive Device. - Lymphedema pumps twice a day for 1 hour each time. 1. Continue with Hydrofera Blue 2. I think her key to maintaining skin integrity here is the external compression pump. I am not sure she is using this either. Of asked her to use it twice a day 3. In spite of #2 her wound actually looks smaller and healthier Electronic Signature(s) Signed: 05/11/2020 5:33:07 PM By: Janice Ham MD Entered By: Janice [name] on 05/11/2020 14:59:50 -------------------------------------------------------------------------------- SuperBill Details Patient Name: Date of Service: Janice Fordyce [name] 05/11/2020 Medical Record Number: [id number] Patient Account Number: [id number] Date of Birth/Sex: Treating RN: 06/13/1941 (79 y.o. 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

## 2020-05-12 NOTE — Progress Notes (Signed)
Janice [name], Janice [name] ([id number]) Visit Report for 05/11/2020 Arrival Information Details Patient Name: Date of Service: Janice [name], Janice [name] 05/11/2020 1:45 PM Medical Record Number: [id number] Patient Account Number: [id number] Date of Birth/Sex: Treating RN: 1941/03/18 (79 y.o. [name] Primary Care [place]: Carol Ada Other Clinician: Referring Grady Mohabir: Treating Vick Filter/Extender: Lona Millard in Treatment: 13 Visit Information History Since Last Visit Added or deleted any medications: No Patient Arrived: Gilford Rile Any new allergies or adverse reactions: No Arrival Time: 14:12 Had a fall or experienced change in No Accompanied By: alone activities of daily living that may affect Transfer Assistance: None risk of falls: Patient Identification Verified: Yes Signs or symptoms of abuse/neglect since last visito No Secondary Verification Process Completed: Yes Hospitalized since last visit: No Patient Requires Transmission-Based Precautions: No Implantable device outside of the clinic excluding No Patient Has Alerts: No cellular tissue based products placed in the center since last visit: Has Dressing in Place as Prescribed: Yes Pain Present Now: No Electronic Signature(s) Signed: 05/12/2020 5:57:53 PM By: Levan Hurst RN, BSN Entered By: Levan Hurst on 05/11/2020 14:12:42 -------------------------------------------------------------------------------- Clinic Level of Care Assessment Details Patient Name: Date of Service: Janice Fordyce [name] 05/11/2020 1:45 PM Medical Record Number: [id number] Patient Account Number: [id number] Date of Birth/Sex: Treating RN: Oct 21, 1940 (79 y.o. Helene Shoe, Tammi Klippel Primary Care Jden Want: Carol Ada Other Clinician: Referring Kynlea Blackston: Treating Demarco Bacci/Extender: Lona Millard in Treatment: 13 Clinic Level of Care Assessment Items TOOL 4 Quantity Score X- 1 0 Use when only an  EandM is performed on FOLLOW-UP visit ASSESSMENTS - Nursing Assessment / Reassessment X- 1 10 Reassessment of Co-morbidities (includes updates in patient status) X- 1 5 Reassessment of Adherence to Treatment Plan ASSESSMENTS - Wound and Skin A ssessment / Reassessment X - Simple Wound Assessment / Reassessment - one wound 1 5 '[]'  - 0 Complex Wound Assessment / Reassessment - multiple wounds X- 1 10 Dermatologic / Skin Assessment (not related to wound area) ASSESSMENTS - Focused Assessment X- 1 5 Circumferential Edema Measurements - multi extremities X- 1 10 Nutritional Assessment / Counseling / Intervention '[]'  - 0 Lower Extremity Assessment (monofilament, tuning fork, pulses) '[]'  - 0 Peripheral Arterial Disease Assessment (using hand held doppler) ASSESSMENTS - Ostomy and/or Continence Assessment and Care '[]'  - 0 Incontinence Assessment and Management '[]'  - 0 Ostomy Care Assessment and Management (repouching, etc.) PROCESS - Coordination of Care X - Simple Patient / Family Education for ongoing care 1 15 '[]'  - 0 Complex (extensive) Patient / Family Education for ongoing care X- 1 10 Staff obtains Programmer, systems, Records, T Results / Process Orders est '[]'  - 0 Staff telephones HHA, Nursing Homes / Clarify orders / etc '[]'  - 0 Routine Transfer to another Facility (non-emergent condition) '[]'  - 0 Routine Hospital Admission (non-emergent condition) '[]'  - 0 New Admissions / Biomedical engineer / Ordering NPWT Apligraf, etc. , '[]'  - 0 Emergency Hospital Admission (emergent condition) X- 1 10 Simple Discharge Coordination '[]'  - 0 Complex (extensive) Discharge Coordination PROCESS - Special Needs '[]'  - 0 Pediatric / Minor Patient Management '[]'  - 0 Isolation Patient Management '[]'  - 0 Hearing / Language / Visual special needs '[]'  - 0 Assessment of Community assistance (transportation, D/C planning, etc.) '[]'  - 0 Additional assistance / Altered mentation '[]'  - 0 Support Surface(s)  Assessment (bed, cushion, seat, etc.) INTERVENTIONS - Wound Cleansing / Measurement X - Simple Wound Cleansing - one wound 1 5 '[]'  - 0 Complex Wound Cleansing - multiple wounds  X- 1 5 Wound Imaging (photographs - any number of wounds) '[]'  - 0 Wound Tracing (instead of photographs) X- 1 5 Simple Wound Measurement - one wound '[]'  - 0 Complex Wound Measurement - multiple wounds INTERVENTIONS - Wound Dressings X - Small Wound Dressing one or multiple wounds 1 10 '[]'  - 0 Medium Wound Dressing one or multiple wounds '[]'  - 0 Large Wound Dressing one or multiple wounds '[]'  - 0 Application of Medications - topical '[]'  - 0 Application of Medications - injection INTERVENTIONS - Miscellaneous '[]'  - 0 External ear exam '[]'  - 0 Specimen Collection (cultures, biopsies, blood, body fluids, etc.) '[]'  - 0 Specimen(s) / Culture(s) sent or taken to Lab for analysis '[]'  - 0 Patient Transfer (multiple staff / Civil Service fast streamer / Similar devices) '[]'  - 0 Simple Staple / Suture removal (25 or less) '[]'  - 0 Complex Staple / Suture removal (26 or more) '[]'  - 0 Hypo / Hyperglycemic Management (close monitor of Blood Glucose) '[]'  - 0 Ankle / Brachial Index (ABI) - do not check if billed separately X- 1 5 Vital Signs Has the patient been seen at the hospital within the last three years: Yes Total Score: 110 Level Of Care: New/Established - Level 3 Electronic Signature(s) Signed: 05/11/2020 5:51:00 PM By: Deon Pilling Entered By: Deon Pilling on 05/11/2020 14:38:12 -------------------------------------------------------------------------------- Encounter Discharge Information Details Patient Name: Date of Service: Janice Fordyce [name] 05/11/2020 1:45 PM Medical Record Number: [id number] Patient Account Number: [id number] Date of Birth/Sex: Treating RN: 1940-11-30 (79 y.o. Elam Dutch Primary Care Jadriel Saxer: Carol Ada Other Clinician: Referring Terry Abila: Treating Ceasar Decandia/Extender: Lona Millard in Treatment: 13 Encounter Discharge Information Items Discharge Condition: Stable Ambulatory Status: Walker Discharge Destination: Home Transportation: Private Auto Accompanied By: self Schedule Follow-up Appointment: Yes Clinical Summary of Care: Patient Declined Electronic Signature(s) Signed: 05/11/2020 5:50:35 PM By: Baruch Gouty RN, BSN Entered By: Baruch Gouty on 05/11/2020 14:50:32 -------------------------------------------------------------------------------- Lower Extremity Assessment Details Patient Name: Date of Service: Janice Fordyce [name] 05/11/2020 1:45 PM Medical Record Number: [id number] Patient Account Number: [id number] Date of Birth/Sex: Treating RN: 1940-09-23 (79 y.o. [name] Primary Care Charlesia Canaday: Carol Ada Other Clinician: Referring Deondrea Aguado: Treating Tieshia Rettinger/Extender: Stefanie Libel, Hal Hope Weeks in Treatment: 13 Edema Assessment Assessed: [Left: No] Patrice Paradise: No] Edema: [Left: Ye] [Right: s] Calf Left: Right: Point of Measurement: 48 cm From Medial Instep 47.8 cm Ankle Left: Right: Point of Measurement: 12 cm From Medial Instep 31.6 cm Vascular Assessment Pulses: Dorsalis Pedis Palpable: [Right:Yes] Electronic Signature(s) Signed: 05/12/2020 5:57:53 PM By: Levan Hurst RN, BSN Entered By: Levan Hurst on 05/11/2020 14:16:20 -------------------------------------------------------------------------------- Multi Wound Chart Details Patient Name: Date of Service: Janice Fordyce [name] 05/11/2020 1:45 PM Medical Record Number: [id number] Patient Account Number: [id number] Date of Birth/Sex: Treating RN: 08-Feb-1941 (79 y.o. Helene Shoe, Tammi Klippel Primary Care Durene Dodge: Carol Ada Other Clinician: Referring Craven Crean: Treating Nikisha Fleece/Extender: Stefanie Libel, Lenor Derrick in Treatment: 13 Vital Signs Height(in): 64 Pulse(bpm): 76 Weight(lbs): 262 Blood Pressure(mmHg):  157/87 Body Mass Index(BMI): 45 Temperature(F): 97.2 Respiratory Rate(breaths/min): 18 Photos: [1:No Photos Right, Anterior Lower Leg] [N/A:N/A N/A] Wound Location: [1:Gradually Appeared] [N/A:N/A] Wounding Event: [1:Venous Leg Ulcer] [N/A:N/A] Primary Etiology: [1:Sleep Apnea] [N/A:N/A] Comorbid History: [1:07/30/2019] [N/A:N/A] Date Acquired: [1:13] [N/A:N/A] Weeks of Treatment: [1:Open] [N/A:N/A] Wound Status: [1:1x1.5x0.1] [N/A:N/A] Measurements L x W x D (cm) [1:1.178] [N/A:N/A] A (cm) : rea [1:0.118] [N/A:N/A] Volume (cm) : [1:72.20%] [N/A:N/A] % Reduction in A rea: [1:72.20%] [N/A:N/A] % Reduction in Volume: [1:Full Thickness  Without Exposed] [N/A:N/A] Classification: [1:Support Structures Medium] [N/A:N/A] Exudate Amount: [1:Serous] [N/A:N/A] Exudate Type: [1:amber] [N/A:N/A] Exudate Color: [1:Flat and Intact] [N/A:N/A] Wound Margin: [1:Medium (34-66%)] [N/A:N/A] Granulation Amount: [1:Pink] [N/A:N/A] Granulation Quality: [1:Medium (34-66%)] [N/A:N/A] Necrotic Amount: [1:Fat Layer (Subcutaneous Tissue): Yes N/A] Exposed Structures: [1:Fascia: No Tendon: No Muscle: No Joint: No Bone: No Medium (34-66%)] [N/A:N/A] Treatment Notes Wound #1 (Right, Anterior Lower Leg) 3. Primary Dressing Applied Hydrofera Blue 4. Secondary Dressing ABD Pad 5. Secured With Recruitment consultant) Signed: 05/11/2020 5:33:07 PM By: Linton Ham MD Signed: 05/11/2020 5:51:00 PM By: Deon Pilling Entered By: Linton Ham on 05/11/2020 14:56:29 -------------------------------------------------------------------------------- Multi-Disciplinary Care Plan Details Patient Name: Date of Service: Janice Fordyce [name] 05/11/2020 1:45 PM Medical Record Number: [id number] Patient Account Number: [id number] Date of Birth/Sex: Treating RN: 09/08/1940 (79 y.o. Helene Shoe, Tammi Klippel Primary Care Elenna Spratling: Carol Ada Other Clinician: Referring Claudio Mondry: Treating Kianah Harries/Extender: Stefanie Libel, Lenor Derrick in Treatment: 13 Active Inactive Wound/Skin Impairment Nursing Diagnoses: Impaired tissue integrity Knowledge deficit related to ulceration/compromised skin integrity Goals: Patient/caregiver will verbalize understanding of skin care regimen Date Initiated: 02/07/2020 Target Resolution Date: 06/23/2020 Goal Status: Active Ulcer/skin breakdown will have a volume reduction of 30% by week 4 Date Initiated: 02/07/2020 Date Inactivated: 03/28/2020 Target Resolution Date: 03/17/2020 Goal Status: Unmet Unmet Reason: comorbities Ulcer/skin breakdown will have a volume reduction of 50% by week 8 Date Initiated: 03/28/2020 Date Inactivated: 04/18/2020 Target Resolution Date: 04/17/2020 Goal Status: Met Ulcer/skin breakdown will have a volume reduction of 80% by week 12 Date Initiated: 04/18/2020 Target Resolution Date: 06/30/2020 Goal Status: Active Interventions: Assess patient/caregiver ability to obtain necessary supplies Assess patient/caregiver ability to perform ulcer/skin care regimen upon admission and as needed Assess ulceration(s) every visit Provide education on ulcer and skin care Notes: Electronic Signature(s) Signed: 05/11/2020 5:51:00 PM By: Deon Pilling Entered By: Deon Pilling on 05/11/2020 14:36:50 -------------------------------------------------------------------------------- Pain Assessment Details Patient Name: Date of Service: Janice [name], Janice [name] 05/11/2020 1:45 PM Medical Record Number: [id number] Patient Account Number: [id number] Date of Birth/Sex: Treating RN: May 20, 1941 (79 y.o. [name] Primary Care Arisa Congleton: Carol Ada Other Clinician: Referring Jakerria Kingbird: Treating Tavien Chestnut/Extender: Lona Millard in Treatment: 13 Active Problems Location of Pain Severity and Description of Pain Patient Has Paino No Site Locations Pain Management and Medication Current Pain Management: Electronic  Signature(s) Signed: 05/12/2020 5:57:53 PM By: Levan Hurst RN, BSN Entered By: Levan Hurst on 05/11/2020 14:13:46 -------------------------------------------------------------------------------- Patient/Caregiver Education Details Patient Name: Date of Service: Janice Fordyce NICE Jerilynn Mages 10/14/2021andnbsp1:45 PM Medical Record Number: [id number] Patient Account Number: [id number] Date of Birth/Gender: Treating RN: Nov 28, 1940 (79 y.o. [name] Primary Care Physician: Carol Ada Other Clinician: Referring Physician: Treating Physician/Extender: Lona Millard in Treatment: 13 Education Assessment Education Provided To: Patient Education Topics Provided Wound/Skin Impairment: Handouts: Skin Care Do's and Dont's Methods: Explain/Verbal Responses: Reinforcements needed Electronic Signature(s) Signed: 05/11/2020 5:51:00 PM By: Deon Pilling Entered By: Deon Pilling on 05/11/2020 14:37:03 -------------------------------------------------------------------------------- Wound Assessment Details Patient Name: Date of Service: Janice [name], Janice [name] 05/11/2020 1:45 PM Medical Record Number: [id number] Patient Account Number: [id number] Date of Birth/Sex: Treating RN: 10/29/1940 (79 y.o. [name] Primary Care Clavin Ruhlman: Carol Ada Other Clinician: Referring Keirstin Musil: Treating Jerine Surles/Extender: Stefanie Libel, Hal Hope Weeks in Treatment: 13 Wound Status Wound Number: 1 Primary Etiology: Venous Leg Ulcer Wound Location: Right, Anterior Lower Leg Wound Status: Open Wounding Event: Gradually Appeared Comorbid History: Sleep Apnea Date Acquired: 07/30/2019 Weeks Of Treatment: 13 Clustered Wound: No Wound  Measurements Length: (cm) 1 Width: (cm) 1.5 Depth: (cm) 0.1 Area: (cm) 1.178 Volume: (cm) 0.118 % Reduction in Area: 72.2% % Reduction in Volume: 72.2% Epithelialization: Medium (34-66%) Tunneling: No Undermining: No Wound  Description Classification: Full Thickness Without Exposed Support Structures Wound Margin: Flat and Intact Exudate Amount: Medium Exudate Type: Serous Exudate Color: amber Foul Odor After Cleansing: No Slough/Fibrino Yes Wound Bed Granulation Amount: Medium (34-66%) Exposed Structure Granulation Quality: Pink Fascia Exposed: No Necrotic Amount: Medium (34-66%) Fat Layer (Subcutaneous Tissue) Exposed: Yes Necrotic Quality: Adherent Slough Tendon Exposed: No Muscle Exposed: No Joint Exposed: No Bone Exposed: No Treatment Notes Wound #1 (Right, Anterior Lower Leg) 3. Primary Dressing Applied Hydrofera Blue 4. Secondary Dressing ABD Pad 5. Secured With Recruitment consultant) Signed: 05/12/2020 5:57:53 PM By: Levan Hurst RN, BSN Entered By: Levan Hurst on 05/11/2020 14:23:19 -------------------------------------------------------------------------------- Vitals Details Patient Name: Date of Service: Janice Fordyce [name] 05/11/2020 1:45 PM Medical Record Number: [id number] Patient Account Number: [id number] Date of Birth/Sex: Treating RN: Sep 21, 1940 (79 y.o. [name] Primary Care [place] Virrueta: Other Clinician: Carol Ada Referring Martin Smeal: Treating Teaghan Formica/Extender: Lona Millard in Treatment: 13 Vital Signs Time Taken: 14:18 Temperature (F): 97.2 Height (in): 64 Pulse (bpm): 76 Weight (lbs): 262 Respiratory Rate (breaths/min): 18 Body Mass Index (BMI): 45 Blood Pressure (mmHg): 157/87 Reference Range: 80 - 120 mg / dl Electronic Signature(s) Signed: 05/12/2020 5:57:53 PM By: Levan Hurst RN, BSN Entered By: Levan Hurst on 05/11/2020 14:18:41

## 2020-05-16 DIAGNOSIS — D649 Anemia, unspecified: Secondary | ICD-10-CM | POA: Diagnosis not present

## 2020-05-31 ENCOUNTER — Ambulatory Visit (INDEPENDENT_AMBULATORY_CARE_PROVIDER_SITE_OTHER): Payer: Medicare Other | Admitting: Podiatry

## 2020-05-31 ENCOUNTER — Other Ambulatory Visit: Payer: Self-pay

## 2020-05-31 ENCOUNTER — Encounter: Payer: Self-pay | Admitting: Podiatry

## 2020-05-31 DIAGNOSIS — B351 Tinea unguium: Secondary | ICD-10-CM | POA: Diagnosis not present

## 2020-05-31 DIAGNOSIS — M79676 Pain in unspecified toe(s): Secondary | ICD-10-CM | POA: Diagnosis not present

## 2020-05-31 NOTE — Progress Notes (Signed)
This patient returns to my office for at risk foot care.  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  This patient went for nail care at a different facility and was not pleased.  Therefore she returns to the office about six weeks after her nail care.  She has not been seen in this office for about one year.  This patient is unable to cut nails herself since the patient cannot reach her nails.These nails are painful walking and wearing shoes.  This patient presents for at risk foot care today.  General Appearance  Alert, conversant and in no acute stress.  Vascular  Dorsalis pedis and posterior tibial  pulses are palpable  bilaterally.  Capillary return is within normal limits  bilaterally. Temperature is within normal limits  bilaterally.  Neurologic  Senn-Weinstein monofilament wire test within normal limits  bilaterally. Muscle power within normal limits bilaterally.  Nails Thick disfigured discolored nails with subungual debris  Hallux nails   bilaterally. No evidence of bacterial infection or drainage bilaterally.  Orthopedic  No limitations of motion  feet .  No crepitus or effusions noted.  No bony pathology or digital deformities noted.  Skin  normotropic skin with no porokeratosis noted bilaterally.  No signs of infections or ulcers noted.     Onychomycosis  Pain in right toes  Pain in left toes  Consent was obtained for treatment procedures.   Mechanical debridement of nails 1-5  bilaterally performed with a nail nipper.  Filed with dremel without incident.    Return office visit   9 weeks                   Told patient to return for periodic foot care and evaluation due to potential at risk complications.   Tishawn Friedhoff DPM   

## 2020-06-01 ENCOUNTER — Encounter (HOSPITAL_BASED_OUTPATIENT_CLINIC_OR_DEPARTMENT_OTHER): Payer: Medicare Other | Attending: Internal Medicine | Admitting: Internal Medicine

## 2020-06-01 DIAGNOSIS — M3312 Other dermatopolymyositis with myopathy: Secondary | ICD-10-CM | POA: Insufficient documentation

## 2020-06-01 DIAGNOSIS — L97818 Non-pressure chronic ulcer of other part of right lower leg with other specified severity: Secondary | ICD-10-CM | POA: Diagnosis not present

## 2020-06-01 DIAGNOSIS — L97819 Non-pressure chronic ulcer of other part of right lower leg with unspecified severity: Secondary | ICD-10-CM | POA: Diagnosis not present

## 2020-06-01 DIAGNOSIS — I890 Lymphedema, not elsewhere classified: Secondary | ICD-10-CM | POA: Insufficient documentation

## 2020-06-02 NOTE — Progress Notes (Signed)
Janice [name], Janice [name] ([id number]) Visit Report for 06/01/2020 Arrival Information Details Patient Name: Date of Service: Janice [name], Janice [name] 06/01/2020 1:45 PM Medical Record Number: [id number] Patient Account Number: [id number] Date of Birth/Sex: Treating RN: 10/26/1940 (79 y.o. Janice [name], Meta.Reding Primary Care Tavares Levinson: Carol Ada Other Clinician: Referring Radie Berges: Treating Merced Brougham/Extender: Lona Millard in Treatment: 16 Visit Information History Since Last Visit Added or deleted any medications: No Patient Arrived: Janice [name] Any new allergies or adverse reactions: No Arrival Time: 14:05 Had a fall or experienced change in No Accompanied By: self activities of daily living that may affect Transfer Assistance: None risk of falls: Patient Identification Verified: Yes Signs or symptoms of abuse/neglect since last visito No Secondary Verification Process Completed: Yes Hospitalized since last visit: No Patient Requires Transmission-Based Precautions: No Implantable device outside of the clinic excluding No Patient Has Alerts: No cellular tissue based products placed in the center since last visit: Has Dressing in Place as Prescribed: Yes Pain Present Now: No Electronic Signature(s) Signed: 06/02/2020 8:27:14 AM By: Sandre Kitty Entered By: Sandre Kitty on 06/01/2020 14:05:36 -------------------------------------------------------------------------------- Clinic Level of Care Assessment Details Patient Name: Date of Service: Janice [name], Janice [name] 06/01/2020 1:45 PM Medical Record Number: [id number] Patient Account Number: [id number] Date of Birth/Sex: Treating RN: [birth year] (79 y.o. Janice [name], Tammi Klippel Primary Care Jvion Turgeon: Carol Ada Other Clinician: Referring Taline Nass: Treating Maveric Debono/Extender: Lona Millard in Treatment: 16 Clinic Level of Care Assessment Items TOOL 4 Quantity Score X- 1 0 Use when only an EandM is  performed on FOLLOW-UP visit ASSESSMENTS - Nursing Assessment / Reassessment X- 1 10 Reassessment of Co-morbidities (includes updates in patient status) X- 1 5 Reassessment of Adherence to Treatment Plan ASSESSMENTS - Wound and Skin A ssessment / Reassessment X - Simple Wound Assessment / Reassessment - one wound 1 5 []  - 0 Complex Wound Assessment / Reassessment - multiple wounds X- 1 10 Dermatologic / Skin Assessment (not related to wound area) ASSESSMENTS - Focused Assessment X- 1 5 Circumferential Edema Measurements - multi extremities X- 1 10 Nutritional Assessment / Counseling / Intervention []  - 0 Lower Extremity Assessment (monofilament, tuning fork, pulses) []  - 0 Peripheral Arterial Disease Assessment (using hand held doppler) ASSESSMENTS - Ostomy and/or Continence Assessment and Care []  - 0 Incontinence Assessment and Management []  - 0 Ostomy Care Assessment and Management (repouching, etc.) PROCESS - Coordination of Care X - Simple Patient / Family Education for ongoing care 1 15 []  - 0 Complex (extensive) Patient / Family Education for ongoing care X- 1 10 Staff obtains Programmer, systems, Records, T Results / Process Orders est []  - 0 Staff telephones HHA, Nursing Homes / Clarify orders / etc []  - 0 Routine Transfer to another Facility (non-emergent condition) []  - 0 Routine Hospital Admission (non-emergent condition) []  - 0 New Admissions / Biomedical engineer / Ordering NPWT Apligraf, etc. , []  - 0 Emergency Hospital Admission (emergent condition) X- 1 10 Simple Discharge Coordination []  - 0 Complex (extensive) Discharge Coordination PROCESS - Special Needs []  - 0 Pediatric / Minor Patient Management []  - 0 Isolation Patient Management []  - 0 Hearing / Language / Visual special needs []  - 0 Assessment of Community assistance (transportation, D/C planning, etc.) []  - 0 Additional assistance / Altered mentation []  - 0 Support Surface(s) Assessment  (bed, cushion, seat, etc.) INTERVENTIONS - Wound Cleansing / Measurement X - Simple Wound Cleansing - one wound 1 5 []  - 0 Complex Wound Cleansing - multiple wounds X- 1  5 Wound Imaging (photographs - any number of wounds) []  - 0 Wound Tracing (instead of photographs) X- 1 5 Simple Wound Measurement - one wound []  - 0 Complex Wound Measurement - multiple wounds INTERVENTIONS - Wound Dressings []  - 0 Small Wound Dressing one or multiple wounds []  - 0 Medium Wound Dressing one or multiple wounds []  - 0 Large Wound Dressing one or multiple wounds []  - 0 Application of Medications - topical []  - 0 Application of Medications - injection INTERVENTIONS - Miscellaneous []  - 0 External ear exam []  - 0 Specimen Collection (cultures, biopsies, blood, body fluids, etc.) []  - 0 Specimen(s) / Culture(s) sent or taken to Lab for analysis []  - 0 Patient Transfer (multiple staff / Civil Service fast streamer / Similar devices) []  - 0 Simple Staple / Suture removal (25 or less) []  - 0 Complex Staple / Suture removal (26 or more) []  - 0 Hypo / Hyperglycemic Management (close monitor of Blood Glucose) []  - 0 Ankle / Brachial Index (ABI) - do not check if billed separately X- 1 5 Vital Signs Has the patient been seen at the hospital within the last three years: Yes Total Score: 100 Level Of Care: New/Established - Level 3 Electronic Signature(s) Signed: 06/01/2020 6:08:00 PM By: Deon Pilling Entered By: Deon Pilling on 06/01/2020 17:43:29 -------------------------------------------------------------------------------- Encounter Discharge Information Details Patient Name: Date of Service: Janice Fordyce [name] 06/01/2020 1:45 PM Medical Record Number: [id number] Patient Account Number: [id number] Date of Birth/Sex: Treating RN: [birth year] (79 y.o. [name] Primary Care Kael Keetch: Carol Ada Other Clinician: Referring Gracianna Vink: Treating Meleah Demeyer/Extender: Lona Millard in Treatment: 16 Encounter Discharge Information Items Discharge Condition: Stable Ambulatory Status: Ambulatory Discharge Destination: Home Transportation: Private Auto Accompanied By: self Schedule Follow-up Appointment: No Clinical Summary of Care: Electronic Signature(s) Signed: 06/01/2020 6:08:00 PM By: Deon Pilling Entered By: Deon Pilling on 06/01/2020 18:00:05 -------------------------------------------------------------------------------- Lower Extremity Assessment Details Patient Name: Date of Service: Janice [name], Janice [name] 06/01/2020 1:45 PM Medical Record Number: [id number] Patient Account Number: [id number] Date of Birth/Sex: Treating RN: [birth year] (79 y.o. Elam Dutch Primary Care Deondra Labrador: Carol Ada Other Clinician: Referring Annia Gomm: Treating Yakira Duquette/Extender: Stefanie Libel, Hal Hope Weeks in Treatment: 16 Edema Assessment Assessed: [Left: No] [Right: No] Edema: [Left: Ye] [Right: s] Calf Left: Right: Point of Measurement: 48 cm From Medial Instep 48.5 cm Ankle Left: Right: Point of Measurement: 12 cm From Medial Instep 32.3 cm Vascular Assessment Pulses: Dorsalis Pedis Palpable: [Right:Yes] Electronic Signature(s) Signed: 06/01/2020 5:54:34 PM By: Baruch Gouty RN, BSN Entered By: Baruch Gouty on 06/01/2020 14:20:06 -------------------------------------------------------------------------------- Multi Wound Chart Details Patient Name: Date of Service: Janice Fordyce [name] 06/01/2020 1:45 PM Medical Record Number: [id number] Patient Account Number: [id number] Date of Birth/Sex: Treating RN: 1941-01-29 (79 y.o. Janice [name], Tammi Klippel Primary Care Kayler Buckholtz: Carol Ada Other Clinician: Referring Senora Lacson: Treating Michaelyn Wall/Extender: Stefanie Libel, Lenor Derrick in Treatment: 16 Vital Signs Height(in): 64 Pulse(bpm): 84 Weight(lbs): 34 Blood Pressure(mmHg): 188/83 Body Mass Index(BMI): 45 Temperature(F):  97.8 Respiratory Rate(breaths/min): 18 Photos: [1:No Photos Right, Anterior Lower Leg] [N/A:N/A N/A] Wound Location: [1:Gradually Appeared] [N/A:N/A] Wounding Event: [1:Venous Leg Ulcer] [N/A:N/A] Primary Etiology: [1:Sleep Apnea] [N/A:N/A] Comorbid History: [1:07/30/2019] [N/A:N/A] Date Acquired: [1:16] [N/A:N/A] Weeks of Treatment: [1:Healed - Epithelialized] [N/A:N/A] Wound Status: [1:0x0x0] [N/A:N/A] Measurements L x W x D (cm) [1:0] [N/A:N/A] A (cm) : rea [1:0] [N/A:N/A] Volume (cm) : [1:100.00%] [N/A:N/A] % Reduction in A rea: [1:100.00%] [N/A:N/A] % Reduction in Volume: [1:Full Thickness Without Exposed] [N/A:N/A] Classification: [1:Support  Structures None Present] [N/A:N/A] Exudate Amount: [1:Flat and Intact] [N/A:N/A] Wound Margin: [1:None Present (0%)] [N/A:N/A] Granulation Amount: [1:None Present (0%)] [N/A:N/A] Necrotic Amount: [1:Fascia: No] [N/A:N/A] Exposed Structures: [1:Fat Layer (Subcutaneous Tissue): No Tendon: No Muscle: No Joint: No Bone: No Large (67-100%)] [N/A:N/A] Treatment Notes Electronic Signature(s) Signed: 06/01/2020 5:37:49 PM By: Linton Ham MD Signed: 06/01/2020 6:08:00 PM By: Deon Pilling Entered By: Linton Ham on 06/01/2020 15:21:37 -------------------------------------------------------------------------------- Multi-Disciplinary Care Plan Details Patient Name: Date of Service: Janice Fordyce [name] 06/01/2020 1:45 PM Medical Record Number: [id number] Patient Account Number: [id number] Date of Birth/Sex: Treating RN: March 18, 1941 (78 y.o. [name] Primary Care Letzy Gullickson: Carol Ada Other Clinician: Referring Avian Greenawalt: Treating Arien Benincasa/Extender: Stefanie Libel, Lenor Derrick in Treatment: 16 Active Inactive Electronic Signature(s) Signed: 06/01/2020 6:08:00 PM By: Deon Pilling Entered By: Deon Pilling on 06/01/2020 17:43:09 -------------------------------------------------------------------------------- Pain  Assessment Details Patient Name: Date of Service: Janice [name], Janice [name] 06/01/2020 1:45 PM Medical Record Number: [id number] Patient Account Number: [id number] Date of Birth/Sex: Treating RN: 06-21-1941 (79 y.o. [name] Primary Care Dainel Arcidiacono: Carol Ada Other Clinician: Referring Anjenette Gerbino: Treating Alexsandra Shontz/Extender: Stefanie Libel, Lenor Derrick in Treatment: 16 Active Problems Location of Pain Severity and Description of Pain Patient Has Paino No Site Locations Pain Management and Medication Current Pain Management: Electronic Signature(s) Signed: 06/01/2020 6:08:00 PM By: Deon Pilling Signed: 06/02/2020 8:27:14 AM By: Sandre Kitty Entered By: Sandre Kitty on 06/01/2020 14:07:33 -------------------------------------------------------------------------------- Patient/Caregiver Education Details Patient Name: Date of Service: Janice Fordyce NICE Jerilynn Mages 11/4/2021andnbsp1:45 PM Medical Record Number: [id number] Patient Account Number: [id number] Date of Birth/Gender: Treating RN: Jul 02, 1941 (79 y.o. [name] Primary Care Physician: Carol Ada Other Clinician: Referring Physician: Treating Physician/Extender: Lona Millard in Treatment: 16 Education Assessment Education Provided To: Patient Education Topics Provided Wound/Skin Impairment: Handouts: Skin Care Do's and Dont's Methods: Explain/Verbal Responses: Reinforcements needed Electronic Signature(s) Signed: 06/01/2020 6:08:00 PM By: Deon Pilling Entered By: Deon Pilling on 06/01/2020 13:17:32 -------------------------------------------------------------------------------- Wound Assessment Details Patient Name: Date of Service: Janice [name], Janice [name] 06/01/2020 1:45 PM Medical Record Number: [id number] Patient Account Number: [id number] Date of Birth/Sex: Treating RN: 13-Sep-1940 (79 y.o. Janice [name], Tammi Klippel Primary Care Rosella Crandell: Carol Ada Other  Clinician: Referring Malaak Stach: Treating Laurene Melendrez/Extender: Stefanie Libel, Hal Hope Weeks in Treatment: 16 Wound Status Wound Number: 1 Primary Etiology: Venous Leg Ulcer Wound Location: Right, Anterior Lower Leg Wound Status: Healed - Epithelialized Wounding Event: Gradually Appeared Comorbid History: Sleep Apnea Date Acquired: 07/30/2019 Weeks Of Treatment: 16 Clustered Wound: No Wound Measurements Length: (cm) Width: (cm) Depth: (cm) Area: (cm) Volume: (cm) 0 % Reduction in Area: 100% 0 % Reduction in Volume: 100% 0 Epithelialization: Large (67-100%) 0 Tunneling: No 0 Undermining: No Wound Description Classification: Full Thickness Without Exposed Support Structures Wound Margin: Flat and Intact Exudate Amount: None Present Foul Odor After Cleansing: No Slough/Fibrino No Wound Bed Granulation Amount: None Present (0%) Exposed Structure Necrotic Amount: None Present (0%) Fascia Exposed: No Fat Layer (Subcutaneous Tissue) Exposed: No Tendon Exposed: No Muscle Exposed: No Joint Exposed: No Bone Exposed: No Electronic Signature(s) Signed: 06/01/2020 5:54:34 PM By: Baruch Gouty RN, BSN Signed: 06/01/2020 6:08:00 PM By: Deon Pilling Entered By: Baruch Gouty on 06/01/2020 14:20:32 -------------------------------------------------------------------------------- Crown Details Patient Name: Date of Service: Janice Fordyce [name] 06/01/2020 1:45 PM Medical Record Number: [id number] Patient Account Number: [id number] Date of Birth/Sex: Treating RN: April 04, 1941 (79 y.o. [name] Primary Care Larsen Dungan: Carol Ada Other Clinician: Referring Taniela Feltus: Treating Abrea Henle/Extender: Stefanie Libel, Candace Weeks in  Treatment: 16 Vital Signs Time Taken: 14:07 Temperature (F): 97.8 Height (in): 64 Pulse (bpm): 84 Weight (lbs): 262 Respiratory Rate (breaths/min): 18 Body Mass Index (BMI): 45 Blood Pressure (mmHg): 188/83 Reference Range: 80 -  120 mg / dl Electronic Signature(s) Signed: 06/02/2020 8:27:14 AM By: Sandre Kitty Entered By: Sandre Kitty on 06/01/2020 14:07:28

## 2020-06-05 NOTE — Progress Notes (Signed)
Janice [name], Janice [name] ([id number]) Visit Report for 06/01/2020 HPI Details Patient Name: Date of Service: Janice [name], Janice [name] 06/01/2020 1:45 PM Medical Record Number: [id number] Patient Account Number: [id number] Date of Birth/Sex: Treating RN: Apr 02, 1941 (79 y.o. Janice [name] Primary Care Provider: Carol Ada Other Clinician: Referring Provider: Treating Provider/Extender: Lona Millard in Treatment: 16 History of Present Illness HPI Description: ADMISSION 02/07/2020 This is an 79 year old woman who is transferring from our sister clinic in [place] after their closure. She has been treated by Dr. Jerilee Hoh in the clinic there since the fall 2018. She was healed in August 2019 but readmitted to the clinic in January 2021. She has compression pumps that were apparently arranged for her by Dr. Sharol Given in the past she uses them once a day although she has not been able to do this recently subsequent to a death of a close friend. 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 Her particular condition is extremely painful to the touch therefore she cannot tolerate stockings or compression wraps although she has not had 1 of these recently. They have been using calcium alginate on the wound. Recently a culture was done and she completed a course of ciprofloxacin. She thinks the wound has been gradually contracting. 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 She appears to be walking with a walker. What drives with hand controllers. She has a custom shoe with an AFO brace on the right leg 7/19; patient was using twice a day  compression pumps up until 2 days ago she had some personal issues and then was scared by using the lightening. She is dressing this is self with Iodosorb/Iodoflex. She is intolerant of compression wraps 7/27; wound exam; right anterior lower tibia. She does not allow compression but I did not manage to get her to agree to use her external compression pumps twice a day. She is using Iodoflex on the wound 8/10-Patient returns at 2 weeks, we have been using Iodoflex on the wound to the right anterior tibia, patient has more pain now than before and has not been using the lymphedema pumps 8/31; 3-week follow-up. Changed to [facility] last time. The patient has a myriad of different reasons why she cannot use her compression pumps. 9/21; 3-week follow-up. Using Hydrofera Blue. She is episodically using her compression pumps because of issues related the pain etc. 10/14; 3-week follow-up. We are using Hydrofera Blue. Wound dimensions are better. She will not allow any form of compression stocking on the skin of her right leg. She has external compression pumps at home but I am not really sure how much she is using this she says once a day however. We are using Hydrofera Blue with border foam 11/4; 3-week follow-up. The patient's wound on the right anterior lower leg is healed. She is epithelialized. She does not have good edema control bilateral lymphedema with chronic hemosiderin deposition. She cannot tolerate any form of compression stocking. She has compression pumps uses a half an hour twice a day. I have tried to coach her into more than this but I do not think we will be able to do so. Electronic Signature(s) Signed: 06/01/2020 5:37:49 PM By: Linton Ham MD Entered  By: Linton Ham on 06/01/2020 15:22:35 -------------------------------------------------------------------------------- Physical Exam Details Patient Name: Date of Service: Janice [name], Janice [name] 06/01/2020 1:45 PM Medical  Record Number: [id number] Patient Account Number: [id number] Date of Birth/Sex: Treating RN: 19-Aug-1940 (79 y.o. Janice [name] Primary Care Provider: Carol Ada Other Clinician: Referring Provider: Treating Provider/Extender: Lona Millard in Treatment: 16 Constitutional Patient is hypertensive.. Pulse regular and within target range for patient.Marland Kitchen Respirations regular, non-labored and within target range.. Temperature is normal and within the target range for the patient.Marland Kitchen Appears in no distress. Notes Wound exam; right anterior lower leg over the tibia. The wound area is fully epithelialized. She does not have good edema control and I suspect she is at high risk for treatment failure. I do not believe she has arterial issues Electronic Signature(s) Signed: 06/01/2020 5:37:49 PM By: Linton Ham MD Entered By: Linton Ham on 06/01/2020 15:23:37 -------------------------------------------------------------------------------- Physician Orders Details Patient Name: Date of Service: Janice Fordyce [name] 06/01/2020 1:45 PM Medical Record Number: [id number] Patient Account Number: [id number] Date of Birth/Sex: Treating RN: 08/11/1940 (79 y.o. 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 Edema Control Avoid standing for long periods of time Elevate legs to the level of the heart or above for 30 minutes daily and/or when sitting, a frequency of: - throughout the  day. Exercise regularly Segmental Compressive Device. - lymphedema pumps twice a day for an hour each time. Electronic Signature(s) Signed: 06/01/2020 5:37:49 PM By: Linton Ham MD Signed: 06/01/2020 6:08:00 PM By: Deon Pilling Entered By: Deon Pilling on 06/01/2020 14:58:44 -------------------------------------------------------------------------------- Problem List Details Patient Name: Date of Service: Janice Fordyce [name] 06/01/2020 1:45 PM Medical Record Number: [id number] Patient Account Number: [id number] Date of Birth/Sex: Treating RN: June 16, 1941 (79 y.o. 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 06/01/2020 1:45 PM Medical Record Number: [id number] Patient Account Number: [id number] Date of Birth/Sex: Treating RN: May 30, 1941 (79 y.o. Janice [name] Primary Care Provider: Carol Ada Other Clinician: Referring Provider: Treating Provider/Extender: Stefanie Libel, Lenor Derrick in Treatment: 16 Subjective History of Present Illness (HPI) ADMISSION 02/07/2020 This is an 79 year old woman who is transferring from our sister clinic in [place] after their closure. She has been treated by Dr. Jerilee Hoh in the  clinic there since the fall 2018. She was healed in August 2019 but readmitted to the clinic in January 2021. She has compression pumps that were apparently arranged for her by Dr. Sharol Given in the past she uses them once a day although she has not been able to do this recently subsequent to a death of  a close friend. 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 Her particular condition is extremely painful to the touch therefore she cannot tolerate stockings or compression wraps although she has not had 1 of these recently. They have been using calcium alginate on the wound. Recently a culture was done and she completed a course of ciprofloxacin. She thinks the wound has been gradually contracting. 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 She appears to be walking with a walker. What drives with hand controllers. She has a custom shoe with an AFO brace on the right leg 7/19; patient was using twice a day compression pumps up until 2 days ago she had some personal issues and then was scared by using the lightening. She is dressing this is self with Iodosorb/Iodoflex. She is intolerant of compression wraps 7/27; wound exam; right anterior lower tibia. She does not allow compression but I did not manage to get her to agree to use her external compression pumps twice a day. She is using Iodoflex on the wound 8/10-Patient returns at 2 weeks, we have been using Iodoflex on the wound to the right anterior tibia, patient has more pain now than before and has not been using the lymphedema pumps 8/31; 3-week follow-up. Changed to [facility] last time.  The patient has a myriad of different reasons why she cannot use her compression pumps. 9/21; 3-week follow-up. Using Hydrofera Blue. She is episodically using her compression pumps because of issues related the pain etc. 10/14; 3-week follow-up. We are using Hydrofera Blue. Wound dimensions are better. She will not allow any form of compression stocking on the skin of her right leg. She has external compression pumps at home but I am not really sure how much she is using this she says once a day however. We are using Hydrofera Blue with border foam 11/4; 3-week follow-up. The patient's wound on the right anterior lower leg is healed. She is epithelialized. She does not have good edema control bilateral lymphedema with chronic hemosiderin deposition. She cannot tolerate any form of compression stocking. She has compression pumps uses a half an hour twice a day. I have tried to coach her into more than this but I do not think we will be able to do so. Objective Constitutional Patient is hypertensive.. Pulse regular and within target range for patient.Marland Kitchen Respirations regular, non-labored and within target range.. Temperature is normal and within the target range for the patient.Marland Kitchen Appears in no distress. Vitals Time Taken: 2:07 PM, Height: 64 in, Weight: 262 lbs, BMI: 45, Temperature: 97.8 F, Pulse: 84 bpm, Respiratory Rate: 18 breaths/min, Blood Pressure: 188/83 mmHg. General Notes: Wound exam; right anterior lower leg over the tibia. The wound area is fully epithelialized. She does not have good edema control and I suspect she is at high risk for treatment failure. I do not believe she has arterial issues Integumentary (Hair, Skin) Wound #1 status is Healed - Epithelialized. Original cause of wound was Gradually Appeared. The wound is located on the Right,Anterior Lower Leg. The wound measures 0cm length x 0cm width x 0cm depth; 0cm^2 area and 0cm^3 volume. There is no tunneling or undermining  noted. There is a none present amount of  drainage noted. The wound margin is flat and intact. There is no granulation within the wound bed. There is no necrotic tissue within the wound bed. 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 Edema Control: Avoid standing for long periods of time Elevate legs to the level of the heart or above for 30 minutes daily and/or when sitting, a frequency of: - throughout the day. Exercise regularly Segmental Compressive Device. - lymphedema pumps twice a day for an hour each time. 1. The patient can be discharged from the wound care center 2. High risk of recidivism secondary to uncontrolled edema inability to tolerate compression stockings 3. She says she is only able to tolerate a half an hour of the external compression pumps twice a day in that case I have asked her to move to 3 times a day. Electronic Signature(s) Signed: 06/01/2020 5:37:49 PM By: Linton Ham MD Entered By: Linton Ham on 06/01/2020 15:24:45 -------------------------------------------------------------------------------- SuperBill Details Patient Name: Date of Service: Janice Fordyce [name] 06/01/2020 Medical Record Number: [id number] Patient Account Number: [id number] Date of Birth/Sex: Treating RN: 12-14-1940 (79 y.o. Janice [name] Primary Care Provider: Carol Ada Other Clinician: Referring Provider: Treating Provider/Extender: Lona Millard in Treatment: 16 Diagnosis Coding ICD-10 Codes Code Description I89.0 Lymphedema, not elsewhere classified L97.818 Non-pressure chronic ulcer of other part of right lower leg with other specified severity M33.12 Other dermatomyositis with  myopathy Facility Procedures CPT4 Code: [id number] Description: 213 - WOUND CARE VISIT-LEV 3 EST PT Modifier: 1 Quantity: Physician Procedures : CPT4 Code Description Modifier [id number] - WC PHYS LEVEL 2 - EST PT ICD-10 Diagnosis Description I89.0 Lymphedema, not elsewhere classified L97.818 Non-pressure chronic ulcer of other part of right lower leg with other specified severity M33.12  Other dermatomyositis with myopathy Quantity: 1 Electronic Signature(s) Signed: 06/01/2020 6:08:00 PM By: Deon Pilling Signed: 06/05/2020 7:30:15 AM By: Linton Ham MD Previous Signature: 06/01/2020 5:37:49 PM Version By: Linton Ham MD Entered By: Deon Pilling on 06/01/2020 17:43:40

## 2020-06-16 ENCOUNTER — Telehealth: Payer: Self-pay | Admitting: Orthopedic Surgery

## 2020-06-16 NOTE — Telephone Encounter (Signed)
Patient called requesting a new script to [facility] in [place] for a replacement  brace for right leg and shoe. Please call patient when script has been sent to [facility]. Patient phone number is [phone].

## 2020-06-20 ENCOUNTER — Telehealth: Payer: Self-pay | Admitting: Orthopedic Surgery

## 2020-06-20 NOTE — Telephone Encounter (Signed)
Faxed

## 2020-06-20 NOTE — Telephone Encounter (Signed)
I called pt to advise that it has been 16 months since she was last in the office. I will fax over new rx but for insurance purposes [employer] may require that she have follow up in the office before they can fabricate a brace.

## 2020-06-20 NOTE — Telephone Encounter (Signed)
Patient called requesting a new script to [facility] in [place] for a replacement  brace for right leg and shoe. Please call patient when script has been sent to [facility]. Pt states this is the second time she's called and now the shoe is being held together with super glue; she would like to have the rx sent as soon as possible and she would like to be notified when when it's faxed.   [phone]

## 2020-06-20 NOTE — Telephone Encounter (Signed)
This has been written and advised pt will fax today see other message insurance may require office visit last time in office 01/2019

## 2020-06-29 DIAGNOSIS — E039 Hypothyroidism, unspecified: Secondary | ICD-10-CM | POA: Diagnosis not present

## 2020-07-24 DIAGNOSIS — R748 Abnormal levels of other serum enzymes: Secondary | ICD-10-CM | POA: Diagnosis not present

## 2020-07-24 DIAGNOSIS — D509 Iron deficiency anemia, unspecified: Secondary | ICD-10-CM | POA: Diagnosis not present

## 2020-07-24 DIAGNOSIS — E039 Hypothyroidism, unspecified: Secondary | ICD-10-CM | POA: Diagnosis not present

## 2020-08-08 ENCOUNTER — Other Ambulatory Visit: Payer: Self-pay

## 2020-08-08 ENCOUNTER — Ambulatory Visit (INDEPENDENT_AMBULATORY_CARE_PROVIDER_SITE_OTHER): Payer: Medicare Other | Admitting: Podiatry

## 2020-08-08 ENCOUNTER — Encounter: Payer: Self-pay | Admitting: Podiatry

## 2020-08-08 DIAGNOSIS — M79676 Pain in unspecified toe(s): Secondary | ICD-10-CM

## 2020-08-08 DIAGNOSIS — B351 Tinea unguium: Secondary | ICD-10-CM

## 2020-08-08 NOTE — Progress Notes (Signed)
This patient returns to my office for at risk foot care.  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  This patient went for nail care at a different facility and was not pleased.  Therefore she returns to the office about six weeks after her nail care.  She has not been seen in this office for about one year.  This patient is unable to cut nails herself since the patient cannot reach her nails.These nails are painful walking and wearing shoes.  This patient presents for at risk foot care today.  General Appearance  Alert, conversant and in no acute stress.  Vascular  Dorsalis pedis and posterior tibial  pulses are palpable  bilaterally.  Capillary return is within normal limits  bilaterally. Temperature is within normal limits  bilaterally.  Neurologic  Senn-Weinstein monofilament wire test within normal limits  bilaterally. Muscle power within normal limits bilaterally.  Nails Thick disfigured discolored nails with subungual debris  Hallux nails   bilaterally. No evidence of bacterial infection or drainage bilaterally.  Orthopedic  No limitations of motion  feet .  No crepitus or effusions noted.  No bony pathology or digital deformities noted.  Skin  normotropic skin with no porokeratosis noted bilaterally.  No signs of infections or ulcers noted.     Onychomycosis  Pain in right toes  Pain in left toes  Consent was obtained for treatment procedures.   Mechanical debridement of nails 1-5  bilaterally performed with a nail nipper.  Filed with dremel without incident.    Return office visit   9 weeks                   Told patient to return for periodic foot care and evaluation due to potential at risk complications.   Charitie Hinote DPM   

## 2020-08-18 DIAGNOSIS — E049 Nontoxic goiter, unspecified: Secondary | ICD-10-CM | POA: Diagnosis not present

## 2020-08-18 DIAGNOSIS — E039 Hypothyroidism, unspecified: Secondary | ICD-10-CM | POA: Diagnosis not present

## 2020-10-25 ENCOUNTER — Ambulatory Visit (INDEPENDENT_AMBULATORY_CARE_PROVIDER_SITE_OTHER): Payer: Medicare Other | Admitting: Podiatry

## 2020-10-25 ENCOUNTER — Encounter: Payer: Self-pay | Admitting: Podiatry

## 2020-10-25 ENCOUNTER — Other Ambulatory Visit: Payer: Self-pay

## 2020-10-25 DIAGNOSIS — B351 Tinea unguium: Secondary | ICD-10-CM

## 2020-10-25 DIAGNOSIS — M79676 Pain in unspecified toe(s): Secondary | ICD-10-CM

## 2020-10-25 DIAGNOSIS — I872 Venous insufficiency (chronic) (peripheral): Secondary | ICD-10-CM | POA: Diagnosis not present

## 2020-10-25 NOTE — Progress Notes (Signed)
This patient returns to my office for at risk foot care.  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  This patient went for nail care at a different facility and was not pleased.  Therefore she returns to the office about six weeks after her nail care.  She has not been seen in this office for about one year.  This patient is unable to cut nails herself since the patient cannot reach her nails.These nails are painful walking and wearing shoes.  This patient presents for at risk foot care today.  General Appearance  Alert, conversant and in no acute stress.  Vascular  Dorsalis pedis and posterior tibial  pulses are palpable  bilaterally.  Capillary return is within normal limits  bilaterally. Temperature is within normal limits  bilaterally.  Neurologic  Senn-Weinstein monofilament wire test within normal limits  bilaterally. Muscle power within normal limits bilaterally.  Nails Thick disfigured discolored nails with subungual debris  Hallux nails   bilaterally. No evidence of bacterial infection or drainage bilaterally.  Orthopedic  No limitations of motion  feet .  No crepitus or effusions noted.  No bony pathology or digital deformities noted.  Skin  normotropic skin with no porokeratosis noted bilaterally.  No signs of infections or ulcers noted.     Onychomycosis  Pain in right toes  Pain in left toes  Consent was obtained for treatment procedures.   Mechanical debridement of nails 1-5  bilaterally performed with a nail nipper.  Filed with dremel without incident.    Return office visit   9 weeks                   Told patient to return for periodic foot care and evaluation due to potential at risk complications.   Gardiner Barefoot DPM

## 2020-10-26 DIAGNOSIS — E038 Other specified hypothyroidism: Secondary | ICD-10-CM | POA: Diagnosis not present

## 2020-10-26 DIAGNOSIS — M150 Primary generalized (osteo)arthritis: Secondary | ICD-10-CM | POA: Diagnosis not present

## 2020-10-26 DIAGNOSIS — S81802D Unspecified open wound, left lower leg, subsequent encounter: Secondary | ICD-10-CM | POA: Diagnosis not present

## 2020-10-26 DIAGNOSIS — M62838 Other muscle spasm: Secondary | ICD-10-CM | POA: Diagnosis not present

## 2020-10-26 DIAGNOSIS — M48061 Spinal stenosis, lumbar region without neurogenic claudication: Secondary | ICD-10-CM | POA: Diagnosis not present

## 2020-10-26 DIAGNOSIS — M3390 Dermatopolymyositis, unspecified, organ involvement unspecified: Secondary | ICD-10-CM | POA: Diagnosis not present

## 2020-11-21 DIAGNOSIS — I10 Essential (primary) hypertension: Secondary | ICD-10-CM | POA: Diagnosis not present

## 2020-11-21 DIAGNOSIS — E039 Hypothyroidism, unspecified: Secondary | ICD-10-CM | POA: Diagnosis not present

## 2020-11-21 DIAGNOSIS — E7800 Pure hypercholesterolemia, unspecified: Secondary | ICD-10-CM | POA: Diagnosis not present

## 2020-11-22 DIAGNOSIS — N3281 Overactive bladder: Secondary | ICD-10-CM | POA: Diagnosis not present

## 2020-11-22 DIAGNOSIS — J4530 Mild persistent asthma, uncomplicated: Secondary | ICD-10-CM | POA: Diagnosis not present

## 2020-11-22 DIAGNOSIS — E871 Hypo-osmolality and hyponatremia: Secondary | ICD-10-CM | POA: Diagnosis not present

## 2020-11-22 DIAGNOSIS — M3392 Dermatopolymyositis, unspecified with myopathy: Secondary | ICD-10-CM | POA: Diagnosis not present

## 2020-11-22 DIAGNOSIS — E039 Hypothyroidism, unspecified: Secondary | ICD-10-CM | POA: Diagnosis not present

## 2020-12-29 ENCOUNTER — Telehealth: Payer: Self-pay | Admitting: *Deleted

## 2020-12-29 NOTE — Telephone Encounter (Signed)
Patient is calling to confirm her appointment for 01/03/21 @9 :00.

## 2021-01-03 ENCOUNTER — Ambulatory Visit (INDEPENDENT_AMBULATORY_CARE_PROVIDER_SITE_OTHER): Payer: Medicare Other | Admitting: Podiatry

## 2021-01-03 ENCOUNTER — Encounter: Payer: Self-pay | Admitting: Podiatry

## 2021-01-03 ENCOUNTER — Other Ambulatory Visit: Payer: Self-pay

## 2021-01-03 DIAGNOSIS — B351 Tinea unguium: Secondary | ICD-10-CM | POA: Diagnosis not present

## 2021-01-03 DIAGNOSIS — M79676 Pain in unspecified toe(s): Secondary | ICD-10-CM

## 2021-01-03 DIAGNOSIS — N3281 Overactive bladder: Secondary | ICD-10-CM | POA: Insufficient documentation

## 2021-01-03 DIAGNOSIS — E559 Vitamin D deficiency, unspecified: Secondary | ICD-10-CM | POA: Insufficient documentation

## 2021-01-03 DIAGNOSIS — I872 Venous insufficiency (chronic) (peripheral): Secondary | ICD-10-CM

## 2021-01-03 DIAGNOSIS — J4530 Mild persistent asthma, uncomplicated: Secondary | ICD-10-CM | POA: Insufficient documentation

## 2021-01-03 DIAGNOSIS — Z8371 Family history of colonic polyps: Secondary | ICD-10-CM | POA: Insufficient documentation

## 2021-01-03 DIAGNOSIS — R739 Hyperglycemia, unspecified: Secondary | ICD-10-CM | POA: Insufficient documentation

## 2021-01-03 DIAGNOSIS — R600 Localized edema: Secondary | ICD-10-CM | POA: Insufficient documentation

## 2021-01-03 DIAGNOSIS — D509 Iron deficiency anemia, unspecified: Secondary | ICD-10-CM | POA: Insufficient documentation

## 2021-01-03 NOTE — Progress Notes (Signed)
This patient returns to my office for at risk foot care.  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   This patient is unable to cut nails herself since the patient cannot reach her nails.These nails are painful walking and wearing shoes.  This patient presents for at risk foot care today.  General Appearance  Alert, conversant and in no acute stress.  Vascular  Dorsalis pedis and posterior tibial  pulses are palpable  bilaterally.  Capillary return is within normal limits  bilaterally. Temperature is within normal limits  bilaterally.  Neurologic  Senn-Weinstein monofilament wire test within normal limits  bilaterally. Muscle power within normal limits bilaterally.  Nails Thick disfigured discolored nails with subungual debris  Hallux nails   bilaterally. No evidence of bacterial infection or drainage bilaterally.  Orthopedic  No limitations of motion  feet .  No crepitus or effusions noted.  No bony pathology or digital deformities noted.  Skin  normotropic skin with no porokeratosis noted bilaterally.  No signs of infections or ulcers noted.     Onychomycosis  Pain in right toes  Pain in left toes  Consent was obtained for treatment procedures.   Mechanical debridement of nails 1-5  bilaterally performed with a nail nipper.  Filed with dremel without incident.    Return office visit   9 weeks                   Told patient to return for periodic foot care and evaluation due to potential at risk complications.   Gardiner Barefoot DPM

## 2021-01-11 DIAGNOSIS — M1990 Unspecified osteoarthritis, unspecified site: Secondary | ICD-10-CM | POA: Diagnosis present

## 2021-01-11 DIAGNOSIS — R799 Abnormal finding of blood chemistry, unspecified: Secondary | ICD-10-CM | POA: Diagnosis not present

## 2021-01-11 DIAGNOSIS — I959 Hypotension, unspecified: Secondary | ICD-10-CM | POA: Diagnosis not present

## 2021-01-11 DIAGNOSIS — Z043 Encounter for examination and observation following other accident: Secondary | ICD-10-CM | POA: Diagnosis not present

## 2021-01-11 DIAGNOSIS — M3390 Dermatopolymyositis, unspecified, organ involvement unspecified: Secondary | ICD-10-CM | POA: Diagnosis present

## 2021-01-11 DIAGNOSIS — E039 Hypothyroidism, unspecified: Secondary | ICD-10-CM | POA: Diagnosis present

## 2021-01-11 DIAGNOSIS — I700 Atherosclerosis of aorta: Secondary | ICD-10-CM | POA: Diagnosis not present

## 2021-01-11 DIAGNOSIS — C801 Malignant (primary) neoplasm, unspecified: Secondary | ICD-10-CM | POA: Diagnosis not present

## 2021-01-11 DIAGNOSIS — M47812 Spondylosis without myelopathy or radiculopathy, cervical region: Secondary | ICD-10-CM | POA: Diagnosis not present

## 2021-01-11 DIAGNOSIS — D649 Anemia, unspecified: Secondary | ICD-10-CM | POA: Diagnosis not present

## 2021-01-11 DIAGNOSIS — K219 Gastro-esophageal reflux disease without esophagitis: Secondary | ICD-10-CM | POA: Diagnosis not present

## 2021-01-11 DIAGNOSIS — G4700 Insomnia, unspecified: Secondary | ICD-10-CM | POA: Diagnosis not present

## 2021-01-11 DIAGNOSIS — R9389 Abnormal findings on diagnostic imaging of other specified body structures: Secondary | ICD-10-CM | POA: Diagnosis not present

## 2021-01-11 DIAGNOSIS — K8689 Other specified diseases of pancreas: Secondary | ICD-10-CM | POA: Diagnosis not present

## 2021-01-11 DIAGNOSIS — M4312 Spondylolisthesis, cervical region: Secondary | ICD-10-CM | POA: Diagnosis not present

## 2021-01-11 DIAGNOSIS — R55 Syncope and collapse: Secondary | ICD-10-CM | POA: Diagnosis not present

## 2021-01-11 DIAGNOSIS — R590 Localized enlarged lymph nodes: Secondary | ICD-10-CM | POA: Diagnosis not present

## 2021-01-11 DIAGNOSIS — J9859 Other diseases of mediastinum, not elsewhere classified: Secondary | ICD-10-CM | POA: Diagnosis not present

## 2021-01-11 DIAGNOSIS — R410 Disorientation, unspecified: Secondary | ICD-10-CM | POA: Diagnosis not present

## 2021-01-11 DIAGNOSIS — I10 Essential (primary) hypertension: Secondary | ICD-10-CM | POA: Diagnosis not present

## 2021-01-11 DIAGNOSIS — I2510 Atherosclerotic heart disease of native coronary artery without angina pectoris: Secondary | ICD-10-CM | POA: Diagnosis not present

## 2021-01-11 DIAGNOSIS — D696 Thrombocytopenia, unspecified: Secondary | ICD-10-CM | POA: Diagnosis not present

## 2021-01-11 DIAGNOSIS — Z743 Need for continuous supervision: Secondary | ICD-10-CM | POA: Diagnosis not present

## 2021-01-11 DIAGNOSIS — Z7982 Long term (current) use of aspirin: Secondary | ICD-10-CM | POA: Diagnosis not present

## 2021-01-11 DIAGNOSIS — C7952 Secondary malignant neoplasm of bone marrow: Secondary | ICD-10-CM | POA: Diagnosis not present

## 2021-01-11 DIAGNOSIS — D6489 Other specified anemias: Secondary | ICD-10-CM | POA: Diagnosis not present

## 2021-01-11 DIAGNOSIS — K5900 Constipation, unspecified: Secondary | ICD-10-CM | POA: Diagnosis not present

## 2021-01-11 DIAGNOSIS — G319 Degenerative disease of nervous system, unspecified: Secondary | ICD-10-CM | POA: Diagnosis not present

## 2021-01-11 DIAGNOSIS — J9811 Atelectasis: Secondary | ICD-10-CM | POA: Diagnosis not present

## 2021-01-11 DIAGNOSIS — R0902 Hypoxemia: Secondary | ICD-10-CM | POA: Diagnosis not present

## 2021-01-11 DIAGNOSIS — Z79899 Other long term (current) drug therapy: Secondary | ICD-10-CM | POA: Diagnosis not present

## 2021-01-11 DIAGNOSIS — J90 Pleural effusion, not elsewhere classified: Secondary | ICD-10-CM | POA: Diagnosis not present

## 2021-01-11 DIAGNOSIS — C7951 Secondary malignant neoplasm of bone: Secondary | ICD-10-CM | POA: Diagnosis present

## 2021-01-11 DIAGNOSIS — R262 Difficulty in walking, not elsewhere classified: Secondary | ICD-10-CM | POA: Diagnosis not present

## 2021-01-11 DIAGNOSIS — C50919 Malignant neoplasm of unspecified site of unspecified female breast: Secondary | ICD-10-CM | POA: Diagnosis not present

## 2021-01-11 DIAGNOSIS — J45909 Unspecified asthma, uncomplicated: Secondary | ICD-10-CM | POA: Diagnosis present

## 2021-01-12 DIAGNOSIS — C50919 Malignant neoplasm of unspecified site of unspecified female breast: Secondary | ICD-10-CM

## 2021-01-16 ENCOUNTER — Other Ambulatory Visit (HOSPITAL_COMMUNITY)
Admission: RE | Admit: 2021-01-16 | Discharge: 2021-01-16 | Disposition: A | Discharge status: Home / Self Care | Payer: Medicare Other | Source: Ambulatory Visit | Attending: Oncology | Admitting: Oncology

## 2021-01-16 DIAGNOSIS — D6489 Other specified anemias: Secondary | ICD-10-CM | POA: Diagnosis not present

## 2021-01-16 DIAGNOSIS — D696 Thrombocytopenia, unspecified: Secondary | ICD-10-CM | POA: Diagnosis not present

## 2021-01-16 DIAGNOSIS — C801 Malignant (primary) neoplasm, unspecified: Secondary | ICD-10-CM | POA: Diagnosis not present

## 2021-01-16 DIAGNOSIS — D630 Anemia in neoplastic disease: Secondary | ICD-10-CM | POA: Insufficient documentation

## 2021-01-16 DIAGNOSIS — C7952 Secondary malignant neoplasm of bone marrow: Secondary | ICD-10-CM | POA: Diagnosis not present

## 2021-01-17 ENCOUNTER — Encounter: Payer: Self-pay | Admitting: Oncology

## 2021-01-17 DIAGNOSIS — R262 Difficulty in walking, not elsewhere classified: Secondary | ICD-10-CM | POA: Diagnosis not present

## 2021-01-17 DIAGNOSIS — C7889 Secondary malignant neoplasm of other digestive organs: Secondary | ICD-10-CM | POA: Diagnosis not present

## 2021-01-17 DIAGNOSIS — M3310 Other dermatopolymyositis, organ involvement unspecified: Secondary | ICD-10-CM | POA: Diagnosis not present

## 2021-01-17 DIAGNOSIS — C50919 Malignant neoplasm of unspecified site of unspecified female breast: Secondary | ICD-10-CM | POA: Diagnosis not present

## 2021-01-17 DIAGNOSIS — K5900 Constipation, unspecified: Secondary | ICD-10-CM | POA: Diagnosis not present

## 2021-01-17 DIAGNOSIS — Z791 Long term (current) use of non-steroidal anti-inflammatories (NSAID): Secondary | ICD-10-CM | POA: Diagnosis not present

## 2021-01-17 DIAGNOSIS — K219 Gastro-esophageal reflux disease without esophagitis: Secondary | ICD-10-CM | POA: Diagnosis not present

## 2021-01-17 DIAGNOSIS — J45909 Unspecified asthma, uncomplicated: Secondary | ICD-10-CM | POA: Diagnosis not present

## 2021-01-17 DIAGNOSIS — G4700 Insomnia, unspecified: Secondary | ICD-10-CM | POA: Diagnosis not present

## 2021-01-17 DIAGNOSIS — I10 Essential (primary) hypertension: Secondary | ICD-10-CM | POA: Diagnosis not present

## 2021-01-17 DIAGNOSIS — E860 Dehydration: Secondary | ICD-10-CM | POA: Diagnosis not present

## 2021-01-17 DIAGNOSIS — R7889 Finding of other specified substances, not normally found in blood: Secondary | ICD-10-CM | POA: Diagnosis not present

## 2021-01-17 DIAGNOSIS — R531 Weakness: Secondary | ICD-10-CM | POA: Diagnosis not present

## 2021-01-17 DIAGNOSIS — D619 Aplastic anemia, unspecified: Secondary | ICD-10-CM | POA: Diagnosis not present

## 2021-01-17 DIAGNOSIS — R9389 Abnormal findings on diagnostic imaging of other specified body structures: Secondary | ICD-10-CM | POA: Diagnosis not present

## 2021-01-17 DIAGNOSIS — Z7401 Bed confinement status: Secondary | ICD-10-CM | POA: Diagnosis not present

## 2021-01-17 DIAGNOSIS — Z743 Need for continuous supervision: Secondary | ICD-10-CM | POA: Diagnosis not present

## 2021-01-17 DIAGNOSIS — Z79899 Other long term (current) drug therapy: Secondary | ICD-10-CM | POA: Diagnosis not present

## 2021-01-17 DIAGNOSIS — D6182 Myelophthisis: Secondary | ICD-10-CM | POA: Diagnosis not present

## 2021-01-17 DIAGNOSIS — D649 Anemia, unspecified: Secondary | ICD-10-CM | POA: Diagnosis not present

## 2021-01-17 DIAGNOSIS — I959 Hypotension, unspecified: Secondary | ICD-10-CM | POA: Diagnosis not present

## 2021-01-17 DIAGNOSIS — R5383 Other fatigue: Secondary | ICD-10-CM | POA: Diagnosis not present

## 2021-01-17 DIAGNOSIS — C7952 Secondary malignant neoplasm of bone marrow: Secondary | ICD-10-CM | POA: Diagnosis not present

## 2021-01-17 DIAGNOSIS — R5381 Other malaise: Secondary | ICD-10-CM | POA: Diagnosis not present

## 2021-01-17 DIAGNOSIS — R410 Disorientation, unspecified: Secondary | ICD-10-CM | POA: Diagnosis not present

## 2021-01-17 DIAGNOSIS — R279 Unspecified lack of coordination: Secondary | ICD-10-CM | POA: Diagnosis not present

## 2021-01-17 DIAGNOSIS — M3390 Dermatopolymyositis, unspecified, organ involvement unspecified: Secondary | ICD-10-CM | POA: Diagnosis not present

## 2021-01-17 DIAGNOSIS — R55 Syncope and collapse: Secondary | ICD-10-CM | POA: Diagnosis not present

## 2021-01-17 DIAGNOSIS — C7951 Secondary malignant neoplasm of bone: Secondary | ICD-10-CM | POA: Diagnosis not present

## 2021-01-17 DIAGNOSIS — E039 Hypothyroidism, unspecified: Secondary | ICD-10-CM | POA: Diagnosis not present

## 2021-01-19 LAB — SURGICAL PATHOLOGY

## 2021-01-22 DIAGNOSIS — Z79899 Other long term (current) drug therapy: Secondary | ICD-10-CM | POA: Diagnosis not present

## 2021-01-22 DIAGNOSIS — Z791 Long term (current) use of non-steroidal anti-inflammatories (NSAID): Secondary | ICD-10-CM | POA: Diagnosis not present

## 2021-01-22 DIAGNOSIS — J45909 Unspecified asthma, uncomplicated: Secondary | ICD-10-CM | POA: Diagnosis not present

## 2021-01-22 DIAGNOSIS — D649 Anemia, unspecified: Secondary | ICD-10-CM | POA: Diagnosis not present

## 2021-01-22 DIAGNOSIS — I10 Essential (primary) hypertension: Secondary | ICD-10-CM | POA: Diagnosis not present

## 2021-01-24 ENCOUNTER — Telehealth: Payer: Self-pay | Admitting: Oncology

## 2021-01-24 ENCOUNTER — Other Ambulatory Visit: Payer: Self-pay | Admitting: Oncology

## 2021-01-24 ENCOUNTER — Encounter (HOSPITAL_COMMUNITY): Payer: Self-pay

## 2021-01-24 DIAGNOSIS — C50919 Malignant neoplasm of unspecified site of unspecified female breast: Secondary | ICD-10-CM

## 2021-01-24 NOTE — Progress Notes (Signed)
[place]  [address] [place],    [address] [phone]  Clinic Day:  01/25/2021  Referring physician: Carol Ada, MD  This document serves as a record of services personally performed by Dequincy Macarthur Critchley, MD. It was created on their behalf by [facility], a trained medical scribe. The creation of this record is based on the scribe's personal observations and the provider's statements to them.  HISTORY OF PRESENT ILLNESS:  Janice [name] is a 80 y.o. female who I recently saw in the hospital with anemia and thrombocytopenia, as well as abnormal scan findings.  CT scans of her chest/abdomen/pelvis revealed nodules throughout her spleen.  Bony lucencies were seen throughout, worrisome for a metastatic process.  Based upon these findings, a bone marrow biopsy was done for further evaluation.  She comes in today to go over her bone marrow biopsy results and their implications.  Since being discharged, the patient has been residing at [facility] for her rehabilitation.    VITALS:  Blood pressure 112/74, pulse 100, temperature 98.1 F (36.7 C), resp. rate 16, SpO2 96 %.  Wt Readings from Last 3 Encounters:  05/18/18 246 lb 0.5 oz (111.6 kg)  03/12/17 246 lb 0.5 oz (111.6 kg)  09/04/16 246 lb (111.6 kg)    There is no height or weight on file to calculate BMI.  Performance status (ECOG): 3 - Symptomatic, >50% confined to bed  PHYSICAL EXAM:  Physical Exam Constitutional:      Appearance: Normal appearance. She is not ill-appearing (chronically ill appearance).     Comments: She appears weak; she is in a wheelchair  HENT:     Mouth/Throat:     Mouth: Mucous membranes are moist.     Pharynx: Oropharynx is clear. No oropharyngeal exudate or posterior oropharyngeal erythema.  Cardiovascular:     Rate and Rhythm: Normal rate and regular rhythm.     Heart sounds: No murmur heard.   No friction rub. No gallop.  Pulmonary:     Effort:  Pulmonary effort is normal. No respiratory distress.     Breath sounds: Normal breath sounds. No wheezing, rhonchi or rales.  Chest:  Breasts:    Right: No swelling, bleeding, inverted nipple, mass, nipple discharge, skin change, axillary adenopathy or supraclavicular adenopathy.     Left: No swelling, bleeding, inverted nipple, mass, nipple discharge, skin change, axillary adenopathy or supraclavicular adenopathy.  Abdominal:     General: Bowel sounds are normal. There is no distension.     Palpations: Abdomen is soft. There is no mass.     Tenderness: There is no abdominal tenderness.  Musculoskeletal:        General: No swelling or tenderness.     Cervical back: Normal range of motion and neck supple.     Right lower leg: No edema.     Left lower leg: No edema.  Lymphadenopathy:     Cervical: No cervical adenopathy.     Right cervical: No superficial, deep or posterior cervical adenopathy.    Left cervical: No superficial, deep or posterior cervical adenopathy.     Upper Body:     Right upper body: No supraclavicular or axillary adenopathy.     Left upper body: No supraclavicular or axillary adenopathy.     Lower Body: No right inguinal adenopathy. No left inguinal adenopathy.  Skin:    General: Skin is warm.     Coloration: Skin is not jaundiced.     Findings:  No lesion or rash.  Neurological:     General: No focal deficit present.     Mental Status: She is alert and oriented to person, place, and time. Mental status is at baseline.     Cranial Nerves: Cranial nerves are intact.  Psychiatric:        Mood and Affect: Mood normal.        Behavior: Behavior normal.        Thought Content: Thought content normal.        Judgment: Judgment normal.   PATHOLOGY:  Her bone marrow biopsy results revealed the following: DIAGNOSIS:   BONE MARROW, ASPIRATE, CLOT, CORE:  -Extensive involvement by metastatic carcinoma   PERIPHERAL BLOOD:  -Normocytic-normochromic anemia   -Thrombocytopenia   COMMENT:   The morphologic and immunohistochemical features are suggestive of  breast primary.  Clinical correlation is recommended.   MICROSCOPIC DESCRIPTION:   PERIPHERAL BLOOD SMEAR: The red blood cells display mild  anisopoikilocytosis with mild to moderate polychromasia.  The white  blood cells are normal in number with scattered neutrophils displaying  mild toxic granulation.  The platelets are decreased in number.   BONE MARROW ASPIRATE: No aspirate material is available for review   TOUCH PREPARATIONS: Numerous predominantly small clusters of malignant  appearing epithelioid cells are seen in a background of scanty  hematopoietic elements   CLOT AND BIOPSY: The sections show 90 to100% cellularity with essential  replacement of the medullary space by metastatic carcinoma characterized  by numerous nests and sheets of malignant appearing epithelioid cells  characterized by generally mild nuclear pleomorphism, vesicular  chromatin and small nucleoli associated with brisk mitosis.  Focal  residual trilineage hematopoiesis is seen.  Immunohistochemical stains  were performed on block C1 with appropriate controls.  The tumor cells  are positive for cytokeratin AE1/AE3, cytokeratin 8/18, cytokeratin 7,  GATA3, and estrogen receptor.  Scattered cells are positive for GCDFP.  No significant staining is seen with cytokeratin 20, CDX2, progesterone  receptor, S100, TTF-1 or WT1.   IRON STAIN: Iron stains are performed on a bone marrow aspirate or touch  imprint smear and section of clot. The controls stained appropriately.        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   Immunohistochemistry from her bone marrow biopsy suggests this is metastatic breast cancer.  Interestingly enough, the patient's physical exam today was not positive for a breast mass being present.  Of note, she has not had a mammogram in numerous years.  Overall, this patient is very frail.  I definitely do not think she can tolerate palliative chemotherapy.  As her cancer stained positive for the estrogen receptor, I will place her on palbociclib/letrozole with the hope these agents will bring the disease burden down in her bone marrow to where it can make a greater quantity of blood cells over time.  As palbociclib can cause cytopenias, her starting dose will be lowered to 100 mg, which she will take daily x 21 days, followed by a 7-day break.  Letrozole will be given at 2.5 mg daily.  A bisphosphonate will also be given in due time to protect her bones against an impending fracture.  I will see  her back in 1 month to reassess her counts, as well as to see how well she is doing after one month of combination endocrine therapy.  The patient and her family understand all the plans discussed today and are in agreement with them.    I, Rita Ohara, am acting as scribe for Marice Potter, MD    I have reviewed this report as typed by the medical scribe, and it is complete and accurate.  Dequincy Macarthur Critchley, MD

## 2021-01-24 NOTE — Telephone Encounter (Signed)
Patient's caregiver called to verify 6/30 Appt for patient

## 2021-01-25 ENCOUNTER — Other Ambulatory Visit: Payer: Self-pay | Admitting: Oncology

## 2021-01-25 ENCOUNTER — Other Ambulatory Visit: Payer: Self-pay

## 2021-01-25 ENCOUNTER — Telehealth: Payer: Self-pay | Admitting: Oncology

## 2021-01-25 ENCOUNTER — Inpatient Hospital Stay: Payer: Medicare Other | Attending: Oncology | Admitting: Oncology

## 2021-01-25 DIAGNOSIS — C50919 Malignant neoplasm of unspecified site of unspecified female breast: Secondary | ICD-10-CM | POA: Diagnosis not present

## 2021-01-25 NOTE — Telephone Encounter (Signed)
Per 6/30 los next appt scheduled and given to patient 

## 2021-01-26 ENCOUNTER — Ambulatory Visit: Payer: Medicare Other | Admitting: Oncology

## 2021-01-31 ENCOUNTER — Telehealth: Payer: Self-pay

## 2021-01-31 DIAGNOSIS — C50919 Malignant neoplasm of unspecified site of unspecified female breast: Secondary | ICD-10-CM | POA: Insufficient documentation

## 2021-01-31 NOTE — Telephone Encounter (Addendum)
02/01/21 @ 1410- I spoke with Janice [name], Doctor, hospital for [employer]. I told her we, nor [facility] had the availability to give unit of blood. If pt remains symptomatic, she will need to be sent to ER. Janice [name] verbalized understanding. They have received pt's letrozole and she got the first dose this morning. They have not received the Ibrance yet, as it is coming from [facility]. I asked her to call us once the [employer] arrives, as we will need to set up chemo education. She verbalized understanding. I confirmed next appt here for 02/26/21 @ 1p lab, 130p with Dr Bobby Rumpf.  If they feel pt needs labs drawn before she is seen here, it should be ordered by the facility providers (Dr Judi Cong or Dyanne Iha).     02/01/21 @1155 - I attempted call to [facility], unit manager, no answer at this time. I left my phone number & ext for her to return my call. [facility], nor [facility] infusion center can administer blood until @ least Monday, 02/05/21. If pt continues to be symptomatic, she will need to be sent to ED.     01/31/21 - I spoke with Mary,LPN, regarding Dr Bobby Rumpf treatment plan. Pt will be taking letrozole 2.5mg  po qd, and Ibrance 100mg  po qd x 21d, off 7 days. She will be taking these medications until there is disease progression or if intolerance. I told Mary,LPN, that the prescriptions have been sent to [facility]. While on the phone with [facility]. She also reorted that pt had blood drawn on 01/29/2021. Pt's Hgb 7.8, she is dizzy, and having low blood pressures. Janice [name] requests that I ask Dr Bobby Rumpf if he wants to order any blood?  RE: NEW PALBO, & LETROZOLE ORDERS Received: Today [name], [name], [name], RN Ok yeah just let me know, we will prob have to send to [facility].         Previous Messages    ----- Message -----  From: Dairl Ponder, RN  Sent: 01/31/2021   2:56 PM EDT  To: Mort Sawyers  Subject: RE: NEW PALBO, & LETROZOLE ORDERS               I have no idea. I'll ask when I call back.   -----  Message -----  From: Mort Sawyers  Sent: 01/31/2021   2:41 PM EDT  To: Melodye Ped, NP, Mohammed Mcandrew Cherylann Banas, RN  Subject: RE: NEW PALBO, & LETROZOLE ORDERS               I had no idea, so does the nursing home pharmacy do [facility]?   ----- Message -----  From: Dairl Ponder, RN  Sent: 01/31/2021   2:13 PM EDT  To: Melodye Ped, NP, Mort Sawyers  Subject: NEW PALBO, & LETROZOLE ORDERS                   I just received a call from a nurse @ [facility] requesting clarification on goals of care for the pt. Dr Bobby Rumpf states he wrote everything down for the pt & pt's brother @ last visit. He is starting her on letrozole and Palbociclib.Prescriptions have been sent to the nursing home's pharmacy.

## 2021-02-01 DIAGNOSIS — I10 Essential (primary) hypertension: Secondary | ICD-10-CM | POA: Diagnosis not present

## 2021-02-01 DIAGNOSIS — M3390 Dermatopolymyositis, unspecified, organ involvement unspecified: Secondary | ICD-10-CM | POA: Diagnosis not present

## 2021-02-01 DIAGNOSIS — C7951 Secondary malignant neoplasm of bone: Secondary | ICD-10-CM | POA: Diagnosis not present

## 2021-02-01 DIAGNOSIS — C50919 Malignant neoplasm of unspecified site of unspecified female breast: Secondary | ICD-10-CM | POA: Diagnosis not present

## 2021-02-01 DIAGNOSIS — J45909 Unspecified asthma, uncomplicated: Secondary | ICD-10-CM | POA: Diagnosis not present

## 2021-02-01 DIAGNOSIS — D6182 Myelophthisis: Secondary | ICD-10-CM | POA: Diagnosis not present

## 2021-02-01 DIAGNOSIS — K219 Gastro-esophageal reflux disease without esophagitis: Secondary | ICD-10-CM | POA: Diagnosis not present

## 2021-02-01 DIAGNOSIS — C7889 Secondary malignant neoplasm of other digestive organs: Secondary | ICD-10-CM | POA: Diagnosis not present

## 2021-02-08 ENCOUNTER — Other Ambulatory Visit: Payer: Self-pay | Admitting: Hematology and Oncology

## 2021-02-08 DIAGNOSIS — M3390 Dermatopolymyositis, unspecified, organ involvement unspecified: Secondary | ICD-10-CM | POA: Diagnosis not present

## 2021-02-08 DIAGNOSIS — C7951 Secondary malignant neoplasm of bone: Secondary | ICD-10-CM | POA: Diagnosis not present

## 2021-02-08 DIAGNOSIS — D649 Anemia, unspecified: Secondary | ICD-10-CM | POA: Diagnosis not present

## 2021-02-08 DIAGNOSIS — D619 Aplastic anemia, unspecified: Secondary | ICD-10-CM | POA: Diagnosis not present

## 2021-02-08 DIAGNOSIS — R5383 Other fatigue: Secondary | ICD-10-CM | POA: Diagnosis not present

## 2021-02-08 DIAGNOSIS — M3310 Other dermatopolymyositis, organ involvement unspecified: Secondary | ICD-10-CM | POA: Diagnosis not present

## 2021-02-08 DIAGNOSIS — R55 Syncope and collapse: Secondary | ICD-10-CM | POA: Diagnosis not present

## 2021-02-08 DIAGNOSIS — C50919 Malignant neoplasm of unspecified site of unspecified female breast: Secondary | ICD-10-CM | POA: Diagnosis not present

## 2021-02-08 DIAGNOSIS — D630 Anemia in neoplastic disease: Secondary | ICD-10-CM

## 2021-02-08 DIAGNOSIS — D6182 Myelophthisis: Secondary | ICD-10-CM | POA: Diagnosis not present

## 2021-02-08 NOTE — Progress Notes (Signed)
Received call from [place], [place] in ER. Patient presents again with severe anemia, HgB of 6.8. Patient reports that her bone marrow decided to stop working. Kaitlyn notes BM biopsy from 6/21, extensive involvement of bone marrow with metastatic cancer. She is not sure if the patient has started palbociclib yet. She will transfuse 2 units of PRBC's and discharge her for outpatient follow up. I will plan to follow the patient more closely and see her back in 1 week with a CBC, T&H.

## 2021-02-09 ENCOUNTER — Telehealth: Payer: Self-pay

## 2021-02-09 NOTE — Telephone Encounter (Addendum)
  Noted.      Message Received: Today [name], [name], Janice Raia W, RN Caller: Unspecified (Today,  1:30 PM) [name], I told her I would check with [facility] and see what her co-pay was through them. Let her know we would get co-pay assistance if available, if not free drug.                 Mary called to let us know pt hasn't received the Ibrance. The Ibrance co-pay is $10,254 for 15 day supply. I transferred the call to [facility] for assistance.

## 2021-02-14 NOTE — Progress Notes (Deleted)
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 female with anemia and thrombocytopenia, as well as abnormal scan findings, who Dr. Bobby Rumpf began seeing in June in the hospital.  CT scans of her chest/abdomen/pelvis revealed nodules throughout her spleen.  Bony lucencies were seen throughout, worrisome for a metastatic process.  Based upon these findings, a bone marrow biopsy was done for further evaluation. Unfortunately, this revealed 90 to100% cellularity with essential replacement of the medullary space by metastatic carcinoma which was estrogen receptor positive.  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  Immunohistochemistry from her bone marrow biopsy suggests this is metastatic breast cancer.  However, a breast mass is not appreciated on examination.  Of note, she has not had a mammogram in numerous years.   As she is very frail, we definitely did not think she could tolerate palliative chemotherapy.   She was placed on palbociclib/letrozole with the hope these agents will bring the disease burden down in her bone marrow to where it can make a greater quantity of blood cells over time.  As palbociclib can cause cytopenias, her starting dose was lowered to 100 mg, which she will take daily x 21 days, followed by a 7-day break, with letrozole 2.5 mg daily.  A bisphosphonate will also be given in due time to protect her bones against an impending fracture.  After discharge, the patient has been residing at [facility] for her rehabilitation. She presented to the emergency room  on June 27th with a hemoglobin is 7.4 and received 1 unit of packed red blood cells and was discharged back to rehab.  INTERVAL HISTORY:  Janice [name] is here today for repeat clinical assessment, as the patient was seen again in the emergency department on July 14th with a hemoglobin of 6.8. she was transfused 2 units of packed red blood cells and discharged home. She denies fevers or chills. She denies pain. Her appetite is good. Her weight {Weight change:10426}.  REVIEW OF SYSTEMS:  Review of Systems - Oncology   VITALS:  There were no vitals taken for this visit.  Wt Readings from Last 3 Encounters:  05/18/18 246 lb 0.5 oz (111.6 kg)  03/12/17 246 lb 0.5 oz (111.6 kg)  09/04/16 246 lb (111.6 kg)    There is no height or weight on file to calculate BMI.  Performance status (ECOG): {CHL ONC [id number]  PHYSICAL EXAM:  Physical Exam  LABS:   CBC Latest Ref Rng & Units 03/13/2017 03/12/2017 02/04/2015  WBC 4.0 - 10.5 K/uL 8.4 8.7 9.1  Hemoglobin 12.0 - 15.0 g/dL 12.3 12.3 9.8(L)  Hematocrit 36.0 - 46.0 % 37.1 37.0 29.9(L)  Platelets 150 - 400 K/uL 301 322 237   CMP Latest Ref Rng & Units 03/13/2017 03/12/2017 02/02/2015  Glucose 65 - 99 mg/dL 97 152(H) 101(H)  BUN 6 - 20 mg/dL 13 15 22(H)  Creatinine 0.44 - 1.00 mg/dL 0.54 0.68 0.67  Sodium 135 - 145 mmol/L 140 137 135  Potassium 3.5 - 5.1 mmol/L 3.8 3.9 4.5  Chloride 101 - 111 mmol/L 107 104 102  CO2 22 - 32 mmol/L  '25 24 27  ' Calcium 8.9 - 10.3 mg/dL 9.6 9.5 8.5(L)  Total Protein 6.5 - 8.1 g/dL - 7.0 -  Total Bilirubin 0.3 - 1.2 mg/dL - 0.4 -  Alkaline Phos 38 - 126 U/L - 75 -  AST 15 - 41 U/L - 18 -  ALT 14 - 54 U/L - 16 -     No results found for: CEA1 / No results found for: CEA1 No results found for: PSA1 No results found for: HBZ169 No results found for: CVE938  No results found for: TOTALPROTELP, ALBUMINELP, A1GS, A2GS, BETS, BETA2SER, GAMS, MSPIKE, SPEI No results found for: TIBC, FERRITIN, IRONPCTSAT No results  found for: LDH  STUDIES:  No results found.    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 Maxwell Caul, uses CPAP q night    Spinal stenosis    Stenosis of right carotid artery    Trigeminal neuralgia    Wears glasses     Past Surgical History:  Procedure Laterality Date   APPENDECTOMY     CHOLECYSTECTOMY  04/12/2014   DR Lanice Shirts   CHOLECYSTECTOMY N/A 04/12/2014   Procedure: LAPAROSCOPIC CHOLECYSTECTOMY;  Surgeon: Coralie Keens, MD;  Location: [facility];  Service: General;  Laterality: N/A;   ESOPHAGOGASTRODUODENOSCOPY  2018   EYE SURGERY     cataracts removed, /w IOL - bilateral    LUMBAR FUSION  2004   SHOULDER SURGERY  2000   right   TONSILLECTOMY     TOTAL HIP ARTHROPLASTY  2005   right   TOTAL KNEE ARTHROPLASTY  2001   right   TOTAL KNEE ARTHROPLASTY Left 02/01/2015   TOTAL KNEE ARTHROPLASTY Left 02/01/2015   Procedure: LEFT TOTAL KNEE ARTHROPLASTY;  Surgeon: Newt Minion, MD;  Location: [place];  Service: Orthopedics;  Laterality: Left;    Family History  Problem Relation Age of Onset   Cancer Mother    Heart attack Father    Cancer Father    Diabetes Maternal Grandmother     Social History:  reports that she has never smoked. She has never used smokeless tobacco. She reports that she does not drink alcohol and does not use drugs.The patient is  {Blank single:19197::"alone","accompanied by"} *** today.  Allergies:  Allergies  Allergen Reactions   Ciprofloxacin Other (See Comments)    Confusion w/psychosis Confusion-in combination with another medicine Muscle spasms Confusion w/psychosis   Gabapentin Other (See Comments)    confusion Muscle jerking confusion Muscle jerking Muscle jerking confusion  confusion Muscle jerking Muscle jerking   Prednisone Anaphylaxis and Other (See Comments)    psychosis confusion   Amlodipine Besylate Other (See Comments)  Muscle weakness Muscle weakness Muscle weakness Muscle weakness Muscle weakness   Atorvastatin Other (See Comments)    Muscle cramps/ muscle weakness Muscle weakness Muscle cramps/ muscle weakness Muscle weakness Muscle cramps/ muscle weakness Muscle cramps/ muscle weakness  Muscle cramps/ muscle weakness Muscle weakness Muscle cramps/ muscle weakness   Baclofen Other (See Comments)    Frequent urination, weakness Frequent urination, weakness   Colesevelam Other (See Comments)    Decreased energy    Doxycycline Hyclate     Other reaction(s): nausea and headaches   Furosemide Other (See Comments)    Muscle weakness Muscle weakness Muscle weakness   Irbesartan Other (See Comments)    Muscle weakness, fatigue -- States she can only take Benicar Muscle weakness, fatigue -- States she can only take Benicar Muscle weakness, fatigue -- States she can only take Benicar   Mycophenolic Acid     Other reaction(s): skin discoloration, itching, fatigue, edema   Myrbetriq [Mirabegron] Other (See Comments)    Blurred vision    Novocain [Procaine]     Does not work   Omeprazole     Other reaction(s): Unknown   Ranitidine     Pt. Knows that it can cause: Confusion & agitation, she is not  willing to take the risk of taking this med.    Sulfa Antibiotics Other (See Comments)    swelling As a child-high fever   Sulfamethoxazole     High fever   Tizanidine      Confusion    Tizanidine Hcl     Other reaction(s): Unknown   Tramadol Other (See Comments)    Confusion w/psychosis confusion   Welchol [Colesevelam Hcl] Other (See Comments)    Muscle weakness    Penicillins Hives, Rash and Other (See Comments)    Hives  Hives    Current Medications: Current Outpatient Medications  Medication Sig Dispense Refill   acetaminophen (TYLENOL) 500 MG tablet 1 tablet as needed     albuterol (PROVENTIL HFA;VENTOLIN HFA) 108 (90 Base) MCG/ACT inhaler Inhale 1-2 puffs into the lungs every 6 (six) hours as needed for wheezing or shortness of breath.     Alpha-Lipoic Acid 300 MG CAPS Take 300 mg by mouth daily.     aspirin 81 MG tablet Take 81 mg by mouth daily.     Baclofen 5 MG TABS Take by mouth.     BENICAR 40 MG tablet Take 40 mg by mouth daily with breakfast.      BIOTIN PO Take 1 tablet by mouth daily.     calcium carbonate (TUMS EX) 750 MG chewable tablet Chew by mouth.     CALCIUM PO Take 500 mg by mouth daily.      carbamazepine (TEGRETOL) 200 MG tablet 1 tablet     Cholecalciferol (VITAMIN D) 2000 UNITS CAPS Take 2,000 Units by mouth 2 (two) times daily.     clindamycin (CLEOCIN) 300 MG capsule TAKE 1 CAPSULE BY MOUTH EVERY 8 HOURS FOR 7 DAYS  1   fesoterodine (TOVIAZ) 8 MG TB24 tablet Take 8 mg by mouth daily.     fexofenadine (ALLEGRA) 180 MG tablet Take by mouth.     fluticasone (FLONASE) 50 MCG/ACT nasal spray Place 1 spray into both nostrils daily as needed for allergies or rhinitis.     folic acid (FOLVITE) 1 MG tablet Take 1 mg by mouth daily.     guaiFENesin (MUCINEX) 600 MG 12 hr tablet Take 600 mg by mouth as needed (congestion).  magnesium oxide (MAG-OX) 400 MG tablet Take by mouth.     MAGNESIUM PO Take 0.5 tablets by mouth 3 (three) times daily.      melatonin 5 MG TABS 1 tablet on the tongue and allow to dissolve at bedtime as needed     montelukast (SINGULAIR) 10 MG tablet Take 10 mg by mouth daily.      Multiple  Vitamins-Minerals (MULTIVITAMIN ADULT PO) Take 1 tablet by mouth daily.      mycophenolate (CELLCEPT) 200 MG/ML suspension Take 1/2 ml each night     naproxen sodium (ANAPROX) 220 MG tablet Take 220 mg by mouth 3 (three) times daily as needed (pain).      pantoprazole (PROTONIX) 40 MG tablet Take by mouth.     predniSONE (DELTASONE) 10 MG tablet Take 10 mg by mouth 2 (two) times daily.     sucralfate (CARAFATE) 1 g tablet Take by mouth.     SYNTHROID 100 MCG tablet Take 100 mcg by mouth daily.     UNABLE TO FIND Take by mouth. Multv. daily     UNABLE TO FIND Take by mouth.     UNABLE TO FIND Take by mouth.     No current facility-administered medications for this visit.     ASSESSMENT & PLAN:   Assessment/Plan:  JACQUELINNE SPEAK is a 80 y.o. 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  She is being treated with palbociclib/letrozole in hope of reducing her disease burden, so her bone marrow can make normal cells. She has had persistent anemia requiring regular transfusions  Plan: ***  The patient understands the plans discussed today and is in agreement with them.  She knows to contact our office if she develops concerns prior to her next appointment.   I provided *** minutes (4:42 PM - 4:42 PM) of face-to-face time during this encounter and > 50% was spent counseling as documented under my assessment and plan.    Marvia Pickles, PA-C

## 2021-02-15 ENCOUNTER — Inpatient Hospital Stay: Payer: Medicare Other

## 2021-02-15 ENCOUNTER — Other Ambulatory Visit: Payer: Self-pay

## 2021-02-15 ENCOUNTER — Ambulatory Visit: Payer: Medicare Other | Admitting: Hematology and Oncology

## 2021-02-15 DIAGNOSIS — D630 Anemia in neoplastic disease: Secondary | ICD-10-CM

## 2021-02-15 DIAGNOSIS — E860 Dehydration: Secondary | ICD-10-CM | POA: Diagnosis not present

## 2021-02-15 DIAGNOSIS — I10 Essential (primary) hypertension: Secondary | ICD-10-CM | POA: Diagnosis not present

## 2021-02-15 DIAGNOSIS — I959 Hypotension, unspecified: Secondary | ICD-10-CM | POA: Diagnosis not present

## 2021-02-15 DIAGNOSIS — R531 Weakness: Secondary | ICD-10-CM | POA: Diagnosis not present

## 2021-02-16 NOTE — Progress Notes (Signed)
Patients Janice [name] was approved through her insurance, she has a $2000+ dollar co-pay. There are no open co-pay funds at this time. I spoke with [name] about bringing in her financial paperwork, and left a message with [name] to see if they can have her sign the application if I send it over. I will submit application once I have all of this completed.

## 2021-02-23 NOTE — Progress Notes (Signed)
Received patients financial information from her Javier Docker, on 02/19/2021. Nursing home faxed back over signed papers for [employer]. Will fax in application for free Ibrance today.

## 2021-02-26 DIAGNOSIS — D6182 Myelophthisis: Secondary | ICD-10-CM | POA: Diagnosis not present

## 2021-02-26 DIAGNOSIS — M609 Myositis, unspecified: Secondary | ICD-10-CM | POA: Diagnosis not present

## 2021-02-27 NOTE — Progress Notes (Signed)
Patient was approved for free Ibrance through [employer], until 07/28/2021. She should receive her first shipment on 08/03, medication will be shipped to [facility].

## 2021-02-28 DIAGNOSIS — D649 Anemia, unspecified: Secondary | ICD-10-CM | POA: Diagnosis not present

## 2021-03-01 DIAGNOSIS — D6182 Myelophthisis: Secondary | ICD-10-CM | POA: Diagnosis not present

## 2021-03-01 DIAGNOSIS — M609 Myositis, unspecified: Secondary | ICD-10-CM | POA: Diagnosis not present

## 2021-03-05 ENCOUNTER — Telehealth: Payer: Self-pay | Admitting: *Deleted

## 2021-03-05 ENCOUNTER — Inpatient Hospital Stay: Payer: Medicare Other | Admitting: Oncology

## 2021-03-05 ENCOUNTER — Inpatient Hospital Stay: Payer: Medicare Other

## 2021-03-05 DIAGNOSIS — I10 Essential (primary) hypertension: Secondary | ICD-10-CM | POA: Diagnosis not present

## 2021-03-05 DIAGNOSIS — C7889 Secondary malignant neoplasm of other digestive organs: Secondary | ICD-10-CM | POA: Diagnosis not present

## 2021-03-05 DIAGNOSIS — J45909 Unspecified asthma, uncomplicated: Secondary | ICD-10-CM | POA: Diagnosis not present

## 2021-03-05 DIAGNOSIS — D6182 Myelophthisis: Secondary | ICD-10-CM | POA: Diagnosis not present

## 2021-03-05 DIAGNOSIS — C7951 Secondary malignant neoplasm of bone: Secondary | ICD-10-CM | POA: Diagnosis not present

## 2021-03-05 DIAGNOSIS — C50919 Malignant neoplasm of unspecified site of unspecified female breast: Secondary | ICD-10-CM | POA: Diagnosis not present

## 2021-03-05 DIAGNOSIS — M3390 Dermatopolymyositis, unspecified, organ involvement unspecified: Secondary | ICD-10-CM | POA: Diagnosis not present

## 2021-03-05 DIAGNOSIS — K219 Gastro-esophageal reflux disease without esophagitis: Secondary | ICD-10-CM | POA: Diagnosis not present

## 2021-03-07 ENCOUNTER — Ambulatory Visit: Payer: Medicare Other | Admitting: Podiatry

## 2021-03-29 NOTE — Telephone Encounter (Signed)
Patient has passed away per family member ,please cancel any future appointments.

## 2021-03-29 DEATH — deceased
